# Patient Record
Sex: Female | Born: 1965 | Race: White | Hispanic: No | Marital: Married | Smoking: Never smoker
Health system: Southern US, Community
[De-identification: ages and names within clinical notes are randomized; demographics above are authoritative.]

## PROBLEM LIST (undated history)

## (undated) DIAGNOSIS — M1990 Unspecified osteoarthritis, unspecified site: Secondary | ICD-10-CM

## (undated) DIAGNOSIS — E079 Disorder of thyroid, unspecified: Secondary | ICD-10-CM

## (undated) DIAGNOSIS — E161 Other hypoglycemia: Secondary | ICD-10-CM

## (undated) DIAGNOSIS — E119 Type 2 diabetes mellitus without complications: Secondary | ICD-10-CM

## (undated) DIAGNOSIS — F419 Anxiety disorder, unspecified: Secondary | ICD-10-CM

## (undated) DIAGNOSIS — M549 Dorsalgia, unspecified: Secondary | ICD-10-CM

## (undated) DIAGNOSIS — E039 Hypothyroidism, unspecified: Secondary | ICD-10-CM

## (undated) DIAGNOSIS — K219 Gastro-esophageal reflux disease without esophagitis: Secondary | ICD-10-CM

## (undated) DIAGNOSIS — F329 Major depressive disorder, single episode, unspecified: Secondary | ICD-10-CM

## (undated) DIAGNOSIS — J45909 Unspecified asthma, uncomplicated: Secondary | ICD-10-CM

## (undated) DIAGNOSIS — F32A Depression, unspecified: Secondary | ICD-10-CM

## (undated) DIAGNOSIS — A0472 Enterocolitis due to Clostridium difficile, not specified as recurrent: Secondary | ICD-10-CM

## (undated) DIAGNOSIS — J069 Acute upper respiratory infection, unspecified: Secondary | ICD-10-CM

## (undated) DIAGNOSIS — I10 Essential (primary) hypertension: Secondary | ICD-10-CM

## (undated) DIAGNOSIS — G709 Myoneural disorder, unspecified: Secondary | ICD-10-CM

## (undated) HISTORY — DX: Acute upper respiratory infection, unspecified: J06.9

## (undated) HISTORY — PX: CERVICAL ABLATION: SHX5771

## (undated) HISTORY — PX: ILEOSTOMY REVISION: SHX1785

## (undated) HISTORY — PX: TONSILLECTOMY: SUR1361

## (undated) HISTORY — PX: HERNIA REPAIR: SHX51

## (undated) HISTORY — PX: ADENOIDECTOMY: SUR15

## (undated) HISTORY — PX: BLADDER SURGERY: SHX569

## (undated) HISTORY — PX: ABDOMINAL SURGERY: SHX537

## (undated) HISTORY — PX: APPENDECTOMY: SHX54

## (undated) HISTORY — PX: BOWEL RESECTION: SHX1257

## (undated) HISTORY — PX: GASTRIC BYPASS: SHX52

## (undated) HISTORY — PX: CARDIAC CATHETERIZATION: SHX172

## (undated) HISTORY — PX: CHOLECYSTECTOMY: SHX55

---

## 2012-02-08 HISTORY — PX: TOTAL KNEE ARTHROPLASTY: SHX125

## 2013-08-23 ENCOUNTER — Encounter (HOSPITAL_BASED_OUTPATIENT_CLINIC_OR_DEPARTMENT_OTHER): Payer: Self-pay | Admitting: Emergency Medicine

## 2013-08-23 ENCOUNTER — Emergency Department (HOSPITAL_BASED_OUTPATIENT_CLINIC_OR_DEPARTMENT_OTHER)
Admission: EM | Admit: 2013-08-23 | Discharge: 2013-08-23 | Disposition: A | Discharge status: Home / Self Care | Payer: Medicare Other | Attending: Emergency Medicine | Admitting: Emergency Medicine

## 2013-08-23 DIAGNOSIS — Z79899 Other long term (current) drug therapy: Secondary | ICD-10-CM | POA: Insufficient documentation

## 2013-08-23 DIAGNOSIS — Y9289 Other specified places as the place of occurrence of the external cause: Secondary | ICD-10-CM | POA: Diagnosis not present

## 2013-08-23 DIAGNOSIS — I10 Essential (primary) hypertension: Secondary | ICD-10-CM | POA: Insufficient documentation

## 2013-08-23 DIAGNOSIS — IMO0002 Reserved for concepts with insufficient information to code with codable children: Secondary | ICD-10-CM | POA: Insufficient documentation

## 2013-08-23 DIAGNOSIS — E119 Type 2 diabetes mellitus without complications: Secondary | ICD-10-CM | POA: Diagnosis not present

## 2013-08-23 DIAGNOSIS — Y9389 Activity, other specified: Secondary | ICD-10-CM | POA: Insufficient documentation

## 2013-08-23 DIAGNOSIS — S01309A Unspecified open wound of unspecified ear, initial encounter: Secondary | ICD-10-CM | POA: Diagnosis present

## 2013-08-23 DIAGNOSIS — E079 Disorder of thyroid, unspecified: Secondary | ICD-10-CM | POA: Diagnosis not present

## 2013-08-23 DIAGNOSIS — Z23 Encounter for immunization: Secondary | ICD-10-CM | POA: Diagnosis not present

## 2013-08-23 HISTORY — DX: Type 2 diabetes mellitus without complications: E11.9

## 2013-08-23 HISTORY — DX: Essential (primary) hypertension: I10

## 2013-08-23 HISTORY — DX: Disorder of thyroid, unspecified: E07.9

## 2013-08-23 MED ORDER — TETANUS-DIPHTH-ACELL PERTUSSIS 5-2.5-18.5 LF-MCG/0.5 IM SUSP
0.5000 mL | Freq: Once | INTRAMUSCULAR | Status: AC
  Administered 2013-08-23: 0.5 mL via INTRAMUSCULAR
  Filled 2013-08-23: qty 0.5

## 2013-08-23 NOTE — Discharge Instructions (Signed)
Sutures should be removed in 7 days.  Laceration Care, Adult A laceration is a cut or lesion that goes through all layers of the skin and into the tissue just beneath the skin. TREATMENT  Some lacerations may not require closure. Some lacerations may not be able to be closed due to an increased risk of infection. It is important to see your caregiver as soon as possible after an injury to minimize the risk of infection and maximize the opportunity for successful closure. If closure is appropriate, pain medicines may be given, if needed. The wound will be cleaned to help prevent infection. Your caregiver will use stitches (sutures), staples, wound glue (adhesive), or skin adhesive strips to repair the laceration. These tools bring the skin edges together to allow for faster healing and a better cosmetic outcome. However, all wounds will heal with a scar. Once the wound has healed, scarring can be minimized by covering the wound with sunscreen during the day for 1 full year. HOME CARE INSTRUCTIONS  For sutures or staples:  Keep the wound clean and dry.  If you were given a bandage (dressing), you should change it at least once a day. Also, change the dressing if it becomes wet or dirty, or as directed by your caregiver.  Wash the wound with soap and water 2 times a day. Rinse the wound off with water to remove all soap. Pat the wound dry with a clean towel.  After cleaning, apply a thin layer of the antibiotic ointment as recommended by your caregiver. This will help prevent infection and keep the dressing from sticking.  You may shower as usual after the first 24 hours. Do not soak the wound in water until the sutures are removed.  Only take over-the-counter or prescription medicines for pain, discomfort, or fever as directed by your caregiver.  Get your sutures or staples removed as directed by your caregiver. For skin adhesive strips:  Keep the wound clean and dry.  Do not get the skin  adhesive strips wet. You may bathe carefully, using caution to keep the wound dry.  If the wound gets wet, pat it dry with a clean towel.  Skin adhesive strips will fall off on their own. You may trim the strips as the wound heals. Do not remove skin adhesive strips that are still stuck to the wound. They will fall off in time. For wound adhesive:  You may briefly wet your wound in the shower or bath. Do not soak or scrub the wound. Do not swim. Avoid periods of heavy perspiration until the skin adhesive has fallen off on its own. After showering or bathing, gently pat the wound dry with a clean towel.  Do not apply liquid medicine, cream medicine, or ointment medicine to your wound while the skin adhesive is in place. This may loosen the film before your wound is healed.  If a dressing is placed over the wound, be careful not to apply tape directly over the skin adhesive. This may cause the adhesive to be pulled off before the wound is healed.  Avoid prolonged exposure to sunlight or tanning lamps while the skin adhesive is in place. Exposure to ultraviolet light in the first year will darken the scar.  The skin adhesive will usually remain in place for 5 to 10 days, then naturally fall off the skin. Do not pick at the adhesive film. You may need a tetanus shot if:  You cannot remember when you had your last tetanus  shot.  You have never had a tetanus shot. If you get a tetanus shot, your arm may swell, get red, and feel warm to the touch. This is common and not a problem. If you need a tetanus shot and you choose not to have one, there is a rare chance of getting tetanus. Sickness from tetanus can be serious. SEEK MEDICAL CARE IF:   You have redness, swelling, or increasing pain in the wound.  You see a red line that goes away from the wound.  You have yellowish-white fluid (pus) coming from the wound.  You have a fever.  You notice a bad smell coming from the wound or  dressing.  Your wound breaks open before or after sutures have been removed.  You notice something coming out of the wound such as wood or glass.  Your wound is on your hand or foot and you cannot move a finger or toe. SEEK IMMEDIATE MEDICAL CARE IF:   Your pain is not controlled with prescribed medicine.  You have severe swelling around the wound causing pain and numbness or a change in color in your arm, hand, leg, or foot.  Your wound splits open and starts bleeding.  You have worsening numbness, weakness, or loss of function of any joint around or beyond the wound.  You develop painful lumps near the wound or on the skin anywhere on your body. MAKE SURE YOU:   Understand these instructions.  Will watch your condition.  Will get help right away if you are not doing well or get worse. Document Released: 01/24/2005 Document Revised: 04/18/2011 Document Reviewed: 07/20/2010 [facility] Patient Information 2015 [place], [place]. This information is not intended to replace advice given to you by your health care provider. Make sure you discuss any questions you have with your health care provider.

## 2013-08-23 NOTE — ED Notes (Signed)
Pt. Has laceration behind the R ear due to a car door hitting her behind the R ear.

## 2013-08-23 NOTE — ED Provider Notes (Addendum)
CSN: [id number]     Arrival date & time 08/23/13  1659 History   First MD Initiated Contact with Patient 08/23/13 1707     Chief Complaint  Patient presents with  . Laceration     (Consider location/radiation/quality/duration/timing/severity/associated sxs/prior Treatment) HPI Comments: The patient presents to the ER for evaluation of a laceration behind her right ear. Patient accidentally hit her ear with a car door. No significant headache, no cause of consciousness. Patient reports only mild pain at the site.   Past Medical History  Diagnosis Date  . Diabetes mellitus without complication   . Thyroid disease   . Hypertension    Past Surgical History  Procedure Laterality Date  . Tonsillectomy    . Cholecystectomy    . Gastric bypass    . Cervical ablation    . Bladder surgery    . Total knee arthroplasty     No family history on file. History  Substance Use Topics  . Smoking status: Not on file  . Smokeless tobacco: Not on file  . Alcohol Use: Not on file   OB History   Grav Para Term Preterm Abortions TAB SAB Ect Mult Living                 Review of Systems  Skin: Positive for wound.      Allergies  Adhesive and Oxycodone  Home Medications   Prior to Admission medications   Medication Sig Start Date End Date Taking? Authorizing Provider  desvenlafaxine (PRISTIQ) 50 MG 24 hr tablet Take 50 mg by mouth daily.   Yes Historical Provider, MD  DULoxetine (CYMBALTA) 60 MG capsule Take 60 mg by mouth daily.   Yes Historical Provider, MD  levothyroxine (SYNTHROID, LEVOTHROID) 50 MCG tablet Take 50 mcg by mouth daily before breakfast.   Yes Historical Provider, MD  lisinopril (PRINIVIL,ZESTRIL) 40 MG tablet Take 40 mg by mouth daily.   Yes Historical Provider, MD  metformin (FORTAMET) 500 MG (OSM) 24 hr tablet Take 500 mg by mouth daily with breakfast.   Yes Historical Provider, MD  traZODone (DESYREL) 50 MG tablet Take 50 mg by mouth at bedtime.   Yes Historical  Provider, MD   BP 133/89  Pulse 67  Temp(Src) 98 F (36.7 C) (Oral)  Resp 18  Ht 5\' 2"  (1.575 m)  Wt 170 lb (77.111 kg)  BMI 31.09 kg/m2  SpO2 100% Physical Exam  Constitutional: She is oriented to person, place, and time.  HENT:  Head:    Neck: Normal range of motion. Neck supple. Muscular tenderness present. No spinous process tenderness present.  Musculoskeletal: Normal range of motion.  Neurological: She is alert and oriented to person, place, and time. She has normal strength. No cranial nerve deficit or sensory deficit. GCS eye subscore is 4. GCS verbal subscore is 5. GCS motor subscore is 6.  Skin:       ED Course  Procedures (including critical care time)  LACERATION REPAIR #1 Performed by: Gilda CreasePOLLINA, Tobey Schmelzle J. Authorized by: Gilda CreasePOLLINA, Matvey Llanas J. Consent: Verbal consent obtained. Risks and benefits: risks, benefits and alternatives were discussed Consent given by: patient Patient identity confirmed: provided demographic data Prepped and Draped in normal sterile fashion Wound explored  Laceration Location: right ear Laceration Length: 3cm No Foreign Bodies seen or palpated Anesthesia: local infiltration Local anesthetic: lidocaine 2% Anesthetic total: 2 ml Irrigation method: syringe Amount of cleaning: standard Skin closure: sutures Number of sutures: 5 Technique: simple interrupted, 5-0 prolene Patient tolerance: Patient  tolerated the procedure well with no immediate complications.  LACERATION REPAIR Performed by: Gilda Crease. Authorized by: Gilda Crease Consent: Verbal consent obtained. Risks and benefits: risks, benefits and alternatives were discussed Consent given by: patient Patient identity confirmed: provided demographic data Prepped and Draped in normal sterile fashion Wound explored  Laceration Location: right ear Laceration Length: 3cm No Foreign Bodies seen or palpated Anesthesia: local infiltration Local  anesthetic: none Anesthetic total: Irrigation method: syringe Amount of cleaning: standard Skin closure: Dermabond Patient tolerance: Patient tolerated the procedure well with no immediate complications.   Labs Review Labs Reviewed - No data to display  Imaging Review No results found.   EKG Interpretation None      MDM   Final diagnoses:  None  Laceration  Patient presents with 2 lacerations behind the right ear. The 3 cm laceration was deep, required extensive irrigation and sutures. Sutures will be removed in 7 days. The more superficial laceration at the reflection of the year did not require sutures, was simply closed with Dermabond.  A pressure dressing was then placed over the ear. I did discuss the need for continued pressure for one or 2 days to prevent hematoma formation and cauliflower ear.  Patient did not have any evidence of direct head injury and did not require any neuroimaging. Neurologic exam was unremarkable.    Gilda Crease, MD 08/23/13 1740  Gilda Crease, MD 08/23/13 [phone]

## 2013-09-20 ENCOUNTER — Encounter (HOSPITAL_BASED_OUTPATIENT_CLINIC_OR_DEPARTMENT_OTHER): Payer: Self-pay | Admitting: Emergency Medicine

## 2013-09-20 ENCOUNTER — Emergency Department (HOSPITAL_BASED_OUTPATIENT_CLINIC_OR_DEPARTMENT_OTHER)
Admission: EM | Admit: 2013-09-20 | Discharge: 2013-09-20 | Disposition: A | Discharge status: Home / Self Care | Payer: Medicare Other | Attending: Emergency Medicine | Admitting: Emergency Medicine

## 2013-09-20 DIAGNOSIS — E119 Type 2 diabetes mellitus without complications: Secondary | ICD-10-CM | POA: Diagnosis not present

## 2013-09-20 DIAGNOSIS — Y929 Unspecified place or not applicable: Secondary | ICD-10-CM | POA: Diagnosis not present

## 2013-09-20 DIAGNOSIS — I10 Essential (primary) hypertension: Secondary | ICD-10-CM | POA: Insufficient documentation

## 2013-09-20 DIAGNOSIS — E079 Disorder of thyroid, unspecified: Secondary | ICD-10-CM | POA: Diagnosis not present

## 2013-09-20 DIAGNOSIS — Y939 Activity, unspecified: Secondary | ICD-10-CM | POA: Diagnosis not present

## 2013-09-20 DIAGNOSIS — T63461A Toxic effect of venom of wasps, accidental (unintentional), initial encounter: Secondary | ICD-10-CM | POA: Insufficient documentation

## 2013-09-20 DIAGNOSIS — W57XXXA Bitten or stung by nonvenomous insect and other nonvenomous arthropods, initial encounter: Secondary | ICD-10-CM | POA: Insufficient documentation

## 2013-09-20 DIAGNOSIS — S60569A Insect bite (nonvenomous) of unspecified hand, initial encounter: Secondary | ICD-10-CM | POA: Insufficient documentation

## 2013-09-20 DIAGNOSIS — Z79899 Other long term (current) drug therapy: Secondary | ICD-10-CM | POA: Insufficient documentation

## 2013-09-20 DIAGNOSIS — T63441A Toxic effect of venom of bees, accidental (unintentional), initial encounter: Secondary | ICD-10-CM

## 2013-09-20 DIAGNOSIS — T6391XA Toxic effect of contact with unspecified venomous animal, accidental (unintentional), initial encounter: Secondary | ICD-10-CM | POA: Insufficient documentation

## 2013-09-20 MED ORDER — PREDNISONE 50 MG PO TABS
60.0000 mg | ORAL_TABLET | Freq: Once | ORAL | Status: AC
  Administered 2013-09-20: 60 mg via ORAL
  Filled 2013-09-20 (×2): qty 1

## 2013-09-20 MED ORDER — PREDNISONE 10 MG PO TABS: 20.0000 mg | ORAL_TABLET | Freq: Every day | ORAL | Status: AC

## 2013-09-20 MED ORDER — DIPHENHYDRAMINE HCL 25 MG PO CAPS
25.0000 mg | ORAL_CAPSULE | Freq: Once | ORAL | Status: AC
  Administered 2013-09-20: 25 mg via ORAL
  Filled 2013-09-20: qty 1

## 2013-09-20 NOTE — ED Notes (Signed)
States she was stung by yellow jackets at 2 pm today.  Stung above left eye, mid back, and left hand. Left hand slightly swollen. C/o pain only in left hand.

## 2013-09-20 NOTE — ED Provider Notes (Signed)
CSN: [id number]     Arrival date & time 09/20/13  1835 History  This chart was scribed for Hilario Quarryanielle S Arina Torry, MD by Julian HyMorgan Graham, ED Scribe. The patient was seen in [facility]. The patient's care was started at 7:05 PM.    Chief Complaint  Patient presents with  . Insect Bite   Patient is a 48 y.o. female presenting with animal bite. The history is provided by the patient. No language interpreter was used.  Animal Bite Contact animal:  Insect Location:  Face, torso and hand Facial injury location:  L eye Hand injury location:  L hand Torso injury location:  R flank Time since incident:  5 hours Pain details:    Severity:  Moderate Incident location:  Home Ineffective treatments:  Cold compresses and prescription drugs Associated symptoms: rash   Associated symptoms: no fever and no numbness    HPI Comments: Cynthia Modenanita Lamour is a 48 y.o. female who presents to the Emergency Department complaining of insect bite on her left hand, right flank and above left eyebrow onset 5 hours ago. Pt reports associated redness and pain near her wound sites. Pt reports the worst pain is near her left thumb and the pain radiates to her left wrist. Pt reports she is having difficulty opening her left hand. Pt reports she was attacked by a wasps nest and stung several times. Pt reports she saw a raised, white bump on left hand that has been resolved with cold compress. Pt reports taking benadryl and using cold compresses with minimal relief. Pt reports taking a Vicodin with minimal relief. Pt denies syncope, vomiting, swelling.   Pt denies having a previous allergic reaction to bee stings. Pt reports past medical history of diabetes and HTN. Pt reports taking metformin ER. Pt reports she had a gastric bypass surgery and it causes her to have uncontrolled swings in blood sugar. Pt reports she can check her blood sugar at home.  Past Medical History  Diagnosis Date  . Diabetes mellitus without complication   .  Thyroid disease   . Hypertension    Past Surgical History  Procedure Laterality Date  . Tonsillectomy    . Cholecystectomy    . Gastric bypass    . Cervical ablation    . Bladder surgery    . Total knee arthroplasty     No family history on file. History  Substance Use Topics  . Smoking status: Never Smoker   . Smokeless tobacco: Not on file  . Alcohol Use: Not on file   OB History   Grav Para Term Preterm Abortions TAB SAB Ect Mult Living                 Review of Systems  Constitutional: Negative for fever and chills.  Respiratory: Negative for shortness of breath and wheezing.   Cardiovascular: Negative for leg swelling.  Gastrointestinal: Negative for nausea and vomiting.  Musculoskeletal: Negative for joint swelling.  Skin: Positive for rash.  Neurological: Negative for numbness.  All other systems reviewed and are negative.  Allergies  Adhesive and Oxycodone  Home Medications   Prior to Admission medications   Medication Sig Start Date End Date Taking? Authorizing Provider  desvenlafaxine (PRISTIQ) 50 MG 24 hr tablet Take 50 mg by mouth daily.   Yes Historical Provider, MD  DULoxetine (CYMBALTA) 60 MG capsule Take 60 mg by mouth daily.   Yes Historical Provider, MD  levothyroxine (SYNTHROID, LEVOTHROID) 50 MCG tablet Take 50 mcg by mouth daily  before breakfast.   Yes Historical Provider, MD  lisinopril (PRINIVIL,ZESTRIL) 40 MG tablet Take 40 mg by mouth daily.   Yes Historical Provider, MD  metformin (FORTAMET) 500 MG (OSM) 24 hr tablet Take 500 mg by mouth daily with breakfast.   Yes Historical Provider, MD  montelukast (SINGULAIR) 10 MG tablet Take 10 mg by mouth at bedtime.   Yes Historical Provider, MD  omeprazole (PRILOSEC) 20 MG capsule Take 20 mg by mouth daily.   Yes Historical Provider, MD  traZODone (DESYREL) 50 MG tablet Take 50 mg by mouth at bedtime.   Yes Historical Provider, MD   Triage Vitals: BP 141/87  Pulse 67  Temp(Src) 98.2 F (36.8 C)   Resp 18  Wt 165 lb (74.844 kg)  SpO2 100% Physical Exam  Nursing note and vitals reviewed. Constitutional: She is oriented to person, place, and time. She appears well-developed and well-nourished.  HENT:  Head: Normocephalic and atraumatic.  Right Ear: External ear normal.  Left Ear: External ear normal.  Nose: Nose normal.  Mouth/Throat: Oropharynx is clear and moist.  Eyes: Conjunctivae and EOM are normal. Pupils are equal, round, and reactive to light.  Neck: Normal range of motion. Neck supple. No JVD present. No tracheal deviation present. No thyromegaly present.  Cardiovascular: Normal rate, regular rhythm, normal heart sounds and intact distal pulses.   Pulmonary/Chest: Effort normal and breath sounds normal. No respiratory distress. She has no wheezes.  Abdominal: Soft. Bowel sounds are normal. She exhibits no mass. There is no tenderness. There is no guarding.  Musculoskeletal: Normal range of motion.  Lymphadenopathy:    She has no cervical adenopathy.  Neurological: She is alert and oriented to person, place, and time. She has normal reflexes. No cranial nerve deficit or sensory deficit. Gait normal. GCS eye subscore is 4. GCS verbal subscore is 5. GCS motor subscore is 6.  Reflex Scores:      Bicep reflexes are 2+ on the right side and 2+ on the left side.      Patellar reflexes are 2+ on the right side and 2+ on the left side. Strength is 5/5 bilateral elbow flexor/extensors, wrist extension/flexion, intrinsic hand strength equal Bilateral hip flexion/extension 5/5, knee flexion/extension 5/5, ankle 5/5 flexion extension    Skin: Skin is warm and dry.  Erythematous area 2x2 cm right mid back, left eyebrow and left palmar thenar eminence some erythema and swelling,   Psychiatric: She has a normal mood and affect. Her behavior is normal. Judgment and thought content normal.    ED Course  Procedures (including critical care time) DIAGNOSTIC STUDIES: Oxygen Saturation is  100% on RA, normal by my interpretation.    COORDINATION OF CARE: 7:10 PM- Will order 10 mg Prednisone and 25 mg Benadryl. Pt advised to closely monitor her blood sugars at home. Patient informed of current plan for treatment and evaluation and agrees with plan at this time.  MDM   Final diagnoses:  Bee sting, accidental or unintentional, initial encounter     I personally performed the services described in this documentation, which was scribed in my presence. The recorded information has been reviewed and considered.     Hilario Quarry, MD 09/21/13 [phone]

## 2013-09-20 NOTE — Discharge Instructions (Signed)

## 2013-09-20 NOTE — ED Notes (Signed)
MD at bedside. 

## 2013-10-30 ENCOUNTER — Other Ambulatory Visit: Payer: Self-pay | Admitting: Orthopedic Surgery

## 2013-11-26 ENCOUNTER — Encounter (HOSPITAL_COMMUNITY): Payer: Self-pay | Admitting: Pharmacy Technician

## 2013-11-28 NOTE — Pre-Procedure Instructions (Signed)
Cynthia Cohen  11/28/2013   Your procedure is scheduled on: Monday, Nov. 2nd   [facility] at  8:00 AM.   Call this number if you have problems the morning of surgery: [phone]   Remember:   Do not eat food or drink liquids after midnight Sunday.    Take these medicines the morning of surgery with A SIP OF WATER: SOMA, CLONAZEPAM, CYMBALTA, GABAPENTIN, VICODIN, Thyroid medicine, PRILOSEC    Do not wear jewelry, make-up or nail polish.   Do not wear lotions, powders, or perfumes. You may NOT wear deodorant the day of surgery.   Do not shave 48 hours prior to surgery.    Do not bring valuables to the hospital.  [facility] is not responsible for any belongings or valuables.               Contacts, dentures or bridgework may not be worn into surgery.  Leave suitcase in the car. After surgery it may be brought to your room.  For patients admitted to the hospital, discharge time is determined by your treatment team.    Name and phone number of your driver: /w Fran   Special Instructions: Special Instructions: [facility] - Preparing for Surgery  Before surgery, you can play an important role.  Because skin is not sterile, your skin needs to be as free of germs as possible.  You can reduce the number of germs on you skin by washing with CHG (chlorahexidine gluconate) soap before surgery.  CHG is an antiseptic cleaner which kills germs and bonds with the skin to continue killing germs even after washing.  Please DO NOT use if you have an allergy to CHG or antibacterial soaps.  If your skin becomes reddened/irritated stop using the CHG and inform your nurse when you arrive at Short Stay.  Do not shave (including legs and underarms) for at least 48 hours prior to the first CHG shower.  You may shave your face.  Please follow these instructions carefully:   1.  Shower with CHG Soap the night before surgery and the  morning of Surgery.  2.  If you  choose to wash your hair, wash your hair first as usual with your  normal shampoo.  3.  After you shampoo, rinse your hair and body thoroughly to remove the  Shampoo.  4.  Use CHG as you would any other liquid soap.  You can apply chg directly to the skin and wash gently with scrungie or a clean washcloth.  5.  Apply the CHG Soap to your body ONLY FROM THE NECK DOWN.    Do not use on open wounds or open sores.  Avoid contact with your eyes, ears, mouth and genitals (private parts).  Wash genitals (private parts)   with your normal soap.  6.  Wash thoroughly, paying special attention to the area where your surgery will be performed.  7.  Thoroughly rinse your body with warm water from the neck down.  8.  DO NOT shower/wash with your normal soap after using and rinsing off   the CHG Soap.  9.  Pat yourself dry with a clean towel.            10.  Wear clean pajamas.            11 .  Place clean sheets on your bed the night of your first shower and do not sleep with pets.  Day of Surgery  Do not apply any lotions/deodorants the morning of surgery.  Please wear clean clothes to the hospital/surgery center.   Please read over the following fact sheets that you were given: Pain Booklet, Coughing and Deep Breathing, MRSA Information and Surgical Site Infection Prevention

## 2013-11-29 ENCOUNTER — Encounter (HOSPITAL_COMMUNITY)
Admission: RE | Admit: 2013-11-29 | Discharge: 2013-11-29 | Disposition: A | Discharge status: Home / Self Care | Payer: Medicare Other | Source: Ambulatory Visit | Attending: Orthopedic Surgery | Admitting: Orthopedic Surgery

## 2013-11-29 ENCOUNTER — Encounter (HOSPITAL_COMMUNITY): Payer: Self-pay

## 2013-11-29 DIAGNOSIS — B951 Streptococcus, group B, as the cause of diseases classified elsewhere: Secondary | ICD-10-CM | POA: Diagnosis not present

## 2013-11-29 DIAGNOSIS — Z01812 Encounter for preprocedural laboratory examination: Secondary | ICD-10-CM | POA: Insufficient documentation

## 2013-11-29 HISTORY — DX: Anxiety disorder, unspecified: F41.9

## 2013-11-29 HISTORY — DX: Other hypoglycemia: E16.1

## 2013-11-29 HISTORY — DX: Depression, unspecified: F32.A

## 2013-11-29 HISTORY — DX: Major depressive disorder, single episode, unspecified: F32.9

## 2013-11-29 HISTORY — DX: Myoneural disorder, unspecified: G70.9

## 2013-11-29 HISTORY — DX: Gastro-esophageal reflux disease without esophagitis: K21.9

## 2013-11-29 HISTORY — DX: Unspecified osteoarthritis, unspecified site: M19.90

## 2013-11-29 HISTORY — DX: Unspecified asthma, uncomplicated: J45.909

## 2013-11-29 HISTORY — DX: Hypothyroidism, unspecified: E03.9

## 2013-11-29 LAB — PROTIME-INR
INR: 0.97 (ref 0.00–1.49)
PROTHROMBIN TIME: 13 s (ref 11.6–15.2)

## 2013-11-29 LAB — COMPREHENSIVE METABOLIC PANEL
ALT: 43 U/L — ABNORMAL HIGH (ref 0–35)
AST: 32 U/L (ref 0–37)
Albumin: 4.1 g/dL (ref 3.5–5.2)
Alkaline Phosphatase: 101 U/L (ref 39–117)
Anion gap: 12 (ref 5–15)
BUN: 10 mg/dL (ref 6–23)
CO2: 29 mEq/L (ref 19–32)
Calcium: 9.5 mg/dL (ref 8.4–10.5)
Chloride: 101 mEq/L (ref 96–112)
Creatinine, Ser: 0.67 mg/dL (ref 0.50–1.10)
GFR calc Af Amer: >90 mL/min (ref >90)
GFR calc non Af Amer: >90 mL/min (ref >90)
Glucose, Bld: 87 mg/dL (ref 70–99)
Potassium: 4.2 mEq/L (ref 3.7–5.3)
Sodium: 142 mEq/L (ref 137–147)
TOTAL PROTEIN: 7.3 g/dL (ref 6.0–8.3)

## 2013-11-29 LAB — CBC WITH DIFFERENTIAL/PLATELET
Basophils Absolute: 0 10*3/uL (ref 0.0–0.1)
Basophils Relative: 0 % (ref 0–1)
EOS ABS: 0.4 10*3/uL (ref 0.0–0.7)
Eosinophils Relative: 4 % (ref 0–5)
HEMATOCRIT: 44.1 % (ref 36.0–46.0)
Hemoglobin: 14.5 g/dL (ref 12.0–15.0)
Lymphocytes Relative: 25 % (ref 12–46)
Lymphs Abs: 2.2 10*3/uL (ref 0.7–4.0)
MCH: 28.9 pg (ref 26.0–34.0)
MCHC: 32.9 g/dL (ref 30.0–36.0)
MCV: 87.8 fL (ref 78.0–100.0)
Monocytes Absolute: 0.7 10*3/uL (ref 0.1–1.0)
Monocytes Relative: 8 % (ref 3–12)
Neutro Abs: 5.7 10*3/uL (ref 1.7–7.7)
Neutrophils Relative %: 63 % (ref 43–77)
Platelets: 318 10*3/uL (ref 150–400)
RBC: 5.02 MIL/uL (ref 3.87–5.11)
RDW: 12.7 % (ref 11.5–15.5)
WBC: 9 10*3/uL (ref 4.0–10.5)

## 2013-11-29 LAB — SURGICAL PCR SCREEN
MRSA, PCR: NEGATIVE
Staphylococcus aureus: POSITIVE — AB

## 2013-11-29 LAB — URINALYSIS, ROUTINE W REFLEX MICROSCOPIC
Bilirubin Urine: NEGATIVE
Glucose, UA: NEGATIVE mg/dL
HGB URINE DIPSTICK: NEGATIVE
Ketones, ur: NEGATIVE mg/dL
Leukocytes, UA: NEGATIVE
NITRITE: NEGATIVE
PH: 5 (ref 5.0–8.0)
Protein, ur: NEGATIVE mg/dL
SPECIFIC GRAVITY, URINE: 1.007 (ref 1.005–1.030)
Urobilinogen, UA: 0.2 mg/dL (ref 0.0–1.0)

## 2013-11-29 LAB — APTT: aPTT: 33 seconds (ref 24–37)

## 2013-12-01 LAB — URINE CULTURE

## 2013-12-07 ENCOUNTER — Encounter (HOSPITAL_BASED_OUTPATIENT_CLINIC_OR_DEPARTMENT_OTHER): Payer: Self-pay | Admitting: Emergency Medicine

## 2013-12-07 ENCOUNTER — Emergency Department (HOSPITAL_BASED_OUTPATIENT_CLINIC_OR_DEPARTMENT_OTHER): Payer: Medicare Other

## 2013-12-07 ENCOUNTER — Emergency Department (HOSPITAL_BASED_OUTPATIENT_CLINIC_OR_DEPARTMENT_OTHER)
Admission: EM | Admit: 2013-12-07 | Discharge: 2013-12-07 | Disposition: A | Discharge status: Home / Self Care | Payer: Medicare Other | Source: Home / Self Care | Attending: Emergency Medicine | Admitting: Emergency Medicine

## 2013-12-07 DIAGNOSIS — M24662 Ankylosis, left knee: Secondary | ICD-10-CM | POA: Diagnosis not present

## 2013-12-07 DIAGNOSIS — R1310 Dysphagia, unspecified: Secondary | ICD-10-CM

## 2013-12-07 DIAGNOSIS — T7840XA Allergy, unspecified, initial encounter: Secondary | ICD-10-CM

## 2013-12-07 DIAGNOSIS — N390 Urinary tract infection, site not specified: Secondary | ICD-10-CM

## 2013-12-07 LAB — BASIC METABOLIC PANEL
ANION GAP: 17 — AB (ref 5–15)
Anion gap: 12 (ref 5–15)
BUN: 9 mg/dL (ref 6–23)
BUN: 14 mg/dL (ref 6–23)
CALCIUM: 9.5 mg/dL (ref 8.4–10.5)
CHLORIDE: 108 meq/L (ref 96–112)
CO2: 28 mEq/L (ref 19–32)
CO2: 21 meq/L (ref 19–32)
Calcium: 9.5 mg/dL (ref 8.4–10.5)
Chloride: 105 mEq/L (ref 96–112)
Creatinine, Ser: 0.6 mg/dL (ref 0.50–1.10)
Creatinine, Ser: 0.5 mg/dL (ref 0.50–1.10)
GFR calc Af Amer: >90 mL/min (ref >90)
GFR calc Af Amer: >90 mL/min (ref >90)
GFR calc non Af Amer: >90 mL/min (ref >90)
Glucose, Bld: 88 mg/dL (ref 70–99)
Glucose, Bld: 138 mg/dL — ABNORMAL HIGH (ref 70–99)
Potassium: 4 mEq/L (ref 3.7–5.3)
Potassium: 3.9 mEq/L (ref 3.7–5.3)
SODIUM: 146 meq/L (ref 137–147)
Sodium: 145 mEq/L (ref 137–147)

## 2013-12-07 LAB — CBC WITH DIFFERENTIAL/PLATELET
BASOS ABS: 0 10*3/uL (ref 0.0–0.1)
BASOS PCT: 0 % (ref 0–1)
Basophils Absolute: 0 10*3/uL (ref 0.0–0.1)
Basophils Relative: 0 % (ref 0–1)
EOS ABS: 0.4 10*3/uL (ref 0.0–0.7)
EOS ABS: 0 10*3/uL (ref 0.0–0.7)
EOS PCT: 5 % (ref 0–5)
Eosinophils Relative: 0 % (ref 0–5)
HCT: 43.9 % (ref 36.0–46.0)
HEMATOCRIT: 42 % (ref 36.0–46.0)
Hemoglobin: 13.8 g/dL (ref 12.0–15.0)
Hemoglobin: 14.7 g/dL (ref 12.0–15.0)
LYMPHS PCT: 8 % — AB (ref 12–46)
Lymphocytes Relative: 18 % (ref 12–46)
Lymphs Abs: 1.7 10*3/uL (ref 0.7–4.0)
Lymphs Abs: 0.8 10*3/uL (ref 0.7–4.0)
MCH: 29.3 pg (ref 26.0–34.0)
MCH: 29.6 pg (ref 26.0–34.0)
MCHC: 32.9 g/dL (ref 30.0–36.0)
MCHC: 33.5 g/dL (ref 30.0–36.0)
MCV: 89.2 fL (ref 78.0–100.0)
MCV: 88.5 fL (ref 78.0–100.0)
MONO ABS: 0.7 10*3/uL (ref 0.1–1.0)
MONOS PCT: 8 % (ref 3–12)
Monocytes Absolute: 0.1 10*3/uL (ref 0.1–1.0)
Monocytes Relative: 1 % — ABNORMAL LOW (ref 3–12)
Neutro Abs: 6.7 10*3/uL (ref 1.7–7.7)
Neutro Abs: 9.1 10*3/uL — ABNORMAL HIGH (ref 1.7–7.7)
Neutrophils Relative %: 69 % (ref 43–77)
Neutrophils Relative %: 91 % — ABNORMAL HIGH (ref 43–77)
Platelets: 318 10*3/uL (ref 150–400)
Platelets: 302 10*3/uL (ref 150–400)
RBC: 4.71 MIL/uL (ref 3.87–5.11)
RBC: 4.96 MIL/uL (ref 3.87–5.11)
RDW: 12.9 % (ref 11.5–15.5)
RDW: 12.9 % (ref 11.5–15.5)
WBC: 9.6 10*3/uL (ref 4.0–10.5)
WBC: 9.9 10*3/uL (ref 4.0–10.5)

## 2013-12-07 LAB — URINALYSIS, ROUTINE W REFLEX MICROSCOPIC
BILIRUBIN URINE: NEGATIVE
Glucose, UA: NEGATIVE mg/dL
Hgb urine dipstick: NEGATIVE
Ketones, ur: NEGATIVE mg/dL
Nitrite: NEGATIVE
PROTEIN: NEGATIVE mg/dL
SPECIFIC GRAVITY, URINE: 1.018 (ref 1.005–1.030)
Urobilinogen, UA: 1 mg/dL (ref 0.0–1.0)
pH: 5.5 (ref 5.0–8.0)

## 2013-12-07 LAB — URINE MICROSCOPIC-ADD ON

## 2013-12-07 LAB — CBG MONITORING, ED: Glucose-Capillary: 130 mg/dL — ABNORMAL HIGH (ref 70–99)

## 2013-12-07 MED ORDER — METHYLPREDNISOLONE SODIUM SUCC 125 MG IJ SOLR
125.0000 mg | Freq: Once | INTRAMUSCULAR | Status: AC
  Administered 2013-12-07: 125 mg via INTRAVENOUS
  Filled 2013-12-07: qty 2

## 2013-12-07 MED ORDER — PREDNISONE (PAK) 10 MG PO TABS: ORAL_TABLET | Freq: Every day | ORAL | Status: DC

## 2013-12-07 MED ORDER — NITROFURANTOIN MONOHYD MACRO 100 MG PO CAPS: 100.0000 mg | ORAL_CAPSULE | Freq: Two times a day (BID) | ORAL | Status: DC

## 2013-12-07 MED ORDER — FLUORESCEIN SODIUM 1 MG OP STRP
ORAL_STRIP | OPHTHALMIC | Status: AC
  Filled 2013-12-07: qty 1

## 2013-12-07 MED ORDER — DIPHENHYDRAMINE HCL 50 MG/ML IJ SOLN
50.0000 mg | Freq: Once | INTRAMUSCULAR | Status: AC
  Administered 2013-12-07: 50 mg via INTRAVENOUS
  Filled 2013-12-07: qty 1

## 2013-12-07 MED ORDER — DIPHENHYDRAMINE HCL 50 MG/ML IJ SOLN
INTRAMUSCULAR | Status: AC
  Filled 2013-12-07: qty 1

## 2013-12-07 MED ORDER — METHYLPREDNISOLONE SODIUM SUCC 125 MG IJ SOLR
INTRAMUSCULAR | Status: AC
  Filled 2013-12-07: qty 2

## 2013-12-07 MED ORDER — SODIUM CHLORIDE 0.9 % IV BOLUS (SEPSIS)
1000.0000 mL | Freq: Once | INTRAVENOUS | Status: AC
  Administered 2013-12-07: 1000 mL via INTRAVENOUS

## 2013-12-07 MED ORDER — DIPHENHYDRAMINE HCL 50 MG/ML IJ SOLN
25.0000 mg | Freq: Once | INTRAMUSCULAR | Status: AC
  Administered 2013-12-07: 25 mg via INTRAVENOUS
  Filled 2013-12-07: qty 1

## 2013-12-07 MED ORDER — IOHEXOL 300 MG/ML  SOLN
75.0000 mL | Freq: Once | INTRAMUSCULAR | Status: AC | PRN
  Administered 2013-12-07: 75 mL via INTRAVENOUS

## 2013-12-07 MED ORDER — FAMOTIDINE IN NACL 20-0.9 MG/50ML-% IV SOLN
20.0000 mg | Freq: Once | INTRAVENOUS | Status: AC
  Administered 2013-12-07: 20 mg via INTRAVENOUS
  Filled 2013-12-07: qty 50

## 2013-12-07 NOTE — Discharge Instructions (Signed)
Drug Allergy (Amoxicillin) Allergic reactions to medicines are common. Some allergic reactions are mild. A delayed type of drug allergy that occurs 1 week or more after exposure to a medicine or vaccine is called serum sickness. A life-threatening, sudden (acute) allergic reaction that involves the whole body is called anaphylaxis. CAUSES  "True" drug allergies occur when there is an allergic reaction to a medicine. This is caused by overactivity of the immune system. First, the body becomes sensitized. The immune system is triggered by your first exposure to the medicine. Following this first exposure, future exposure to the same medicine may be life-threatening. Almost any medicine can cause an allergic reaction. Common ones are:  Penicillin.  Sulfonamides (sulfa drugs).  Local anesthetics.  X-ray dyes that contain iodine. SYMPTOMS  Common symptoms of a minor allergic reaction are:  Swelling around the mouth.  An itchy red rash or hives.  Vomiting or diarrhea. Anaphylaxis can cause swelling of the mouth and throat. This makes it difficult to breathe and swallow. Severe reactions can be fatal within seconds, even after exposure to only a trace amount of the drug that causes the reaction. HOME CARE INSTRUCTIONS   If you are unsure of what caused your reaction, keep a diary of foods and medicines used. Include the symptoms that followed. Avoid anything that causes reactions.  You may want to follow up with an allergy specialist after the reaction has cleared in order to be tested to confirm the allergy. It is important to confirm that your reaction is an allergy, not just a side effect to the medicine. If you have a true allergy to a medicine, this may prevent that medicine and related medicines from being given to you when you are very ill.  If you have hives or a rash:  Take medicines as directed by your caregiver.  You may use an over-the-counter antihistamine (diphenhydramine) as  needed.  Apply cold compresses to the skin or take baths in cool water. Avoid hot baths or showers.  If you are severely allergic:  Continuous observation after a severe reaction may be needed. Hospitalization is often required.  Wear a medical alert bracelet or necklace stating your allergy.  You and your family must learn how to use an anaphylaxis kit or give an epinephrine injection to temporarily treat an emergency allergic reaction. If you have had a severe reaction, always carry your epinephrine injection or anaphylaxis kit with you. This can be lifesaving if you have a severe reaction.  Do not drive or perform tasks after treatment until the medicines used to treat your reaction have worn off, or until your caregiver says it is okay. SEEK MEDICAL CARE IF:   You think you had an allergic reaction. Symptoms usually start within 30 minutes after exposure.  Symptoms are getting worse rather than better.  You develop new symptoms.  The symptoms that brought you to your caregiver return. SEEK IMMEDIATE MEDICAL CARE IF:   You have swelling of the mouth, difficulty breathing, or wheezing.  You have a tight feeling in your chest or throat.  You develop hives, swelling, or itching all over your body.  You develop severe vomiting or diarrhea.  You feel faint or pass out. This is an emergency. Use your epinephrine injection or anaphylaxis kit as you have been instructed. Call for emergency medical help. Even if you improve after the injection, you need to be examined at a hospital emergency department. MAKE SURE YOU:   Understand these instructions.  Will  watch your condition.  Will get help right away if you are not doing well or get worse. Document Released: 01/24/2005 Document Revised: 04/18/2011 Document Reviewed: 06/30/2010 [facility] Patient Information 2015 [place], [place]. This information is not intended to replace advice given to you by your health care provider. Make  sure you discuss any questions you have with your health care provider.   Urinary Tract Infection Urinary tract infections (UTIs) can develop anywhere along your urinary tract. Your urinary tract is your body's drainage system for removing wastes and extra water. Your urinary tract includes two kidneys, two ureters, a bladder, and a urethra. Your kidneys are a pair of bean-shaped organs. Each kidney is about the size of your fist. They are located below your ribs, one on each side of your spine. CAUSES Infections are caused by microbes, which are microscopic organisms, including fungi, viruses, and bacteria. These organisms are so small that they can only be seen through a microscope. Bacteria are the microbes that most commonly cause UTIs. SYMPTOMS  Symptoms of UTIs may vary by age and gender of the patient and by the location of the infection. Symptoms in young women typically include a frequent and intense urge to urinate and a painful, burning feeling in the bladder or urethra during urination. Older women and men are more likely to be tired, shaky, and weak and have muscle aches and abdominal pain. A fever may mean the infection is in your kidneys. Other symptoms of a kidney infection include pain in your back or sides below the ribs, nausea, and vomiting. DIAGNOSIS To diagnose a UTI, your caregiver will ask you about your symptoms. Your caregiver also will ask to provide a urine sample. The urine sample will be tested for bacteria and white blood cells. White blood cells are made by your body to help fight infection. TREATMENT  Typically, UTIs can be treated with medication. Because most UTIs are caused by a bacterial infection, they usually can be treated with the use of antibiotics. The choice of antibiotic and length of treatment depend on your symptoms and the type of bacteria causing your infection. HOME CARE INSTRUCTIONS  If you were prescribed antibiotics, take them exactly as your  caregiver instructs you. Finish the medication even if you feel better after you have only taken some of the medication.  Drink enough water and fluids to keep your urine clear or pale yellow.  Avoid caffeine, tea, and carbonated beverages. They tend to irritate your bladder.  Empty your bladder often. Avoid holding urine for long periods of time.  Empty your bladder before and after sexual intercourse.  After a bowel movement, women should cleanse from front to back. Use each tissue only once. SEEK MEDICAL CARE IF:   You have back pain.  You develop a fever.  Your symptoms do not begin to resolve within 3 days. SEEK IMMEDIATE MEDICAL CARE IF:   You have severe back pain or lower abdominal pain.  You develop chills.  You have nausea or vomiting.  You have continued burning or discomfort with urination. MAKE SURE YOU:   Understand these instructions.  Will watch your condition.  Will get help right away if you are not doing well or get worse. Document Released: 11/03/2004 Document Revised: 07/26/2011 Document Reviewed: 03/04/2011 [facility] Patient Information 2015 [place], [place]. This information is not intended to replace advice given to you by your health care provider. Make sure you discuss any questions you have with your health care provider.

## 2013-12-07 NOTE — ED Notes (Addendum)
Pt reports she was seen this morning and treated for an allergic reaction to amoxicillin - reports she went home and took Bactroban ointment to nares and noticed difficulty swallowing after one hour after use. Reports taking Benadryl 50mg  PO @ 1530.

## 2013-12-07 NOTE — ED Notes (Signed)
Pt c/o difficulty swallowing but denies sore throat or dyspnea. Pt took benadryl without relief.

## 2013-12-07 NOTE — ED Provider Notes (Signed)
CSN: [id number]     Arrival date & time 12/07/13  [id number] History   First MD Initiated Contact with Patient 12/07/13 0830     Chief Complaint  Patient presents with  . Dysphagia     (Consider location/radiation/quality/duration/timing/severity/associated sxs/prior Treatment) HPI Comments: Patient presents to the ER for evaluation of difficulty swallowing. Patient reports that she woke up and noticed that she was hoarse. She then started to have difficulty swallowing. She denies any sore throat. There is no difficulty breathing. Patient reports that she is currently on amoxicillin for a urinary tract infection and also using mupirocin nasal because she is going to have knee surgery in 2 days. She has not noticed any outward rash, however. There is no nausea, vomiting or abdominal pain. Patient reports that the difficulty swallowing has rapidly worsened in the last hour or so.   Past Medical History  Diagnosis Date  . Thyroid disease   . Hypertension   . Asthma     rare exposure to smoke or other irritatnt   . Hypothyroidism   . Depression   . Anxiety   . GERD (gastroesophageal reflux disease)   . Arthritis     L knee   . Neuromuscular disorder     tourette syndrome, followed by neurology,neuropathy   . Hyperinsulinemia   . Diabetes mellitus without complication     in combination with hyperinsulinnemia    Past Surgical History  Procedure Laterality Date  . Tonsillectomy    . Cholecystectomy    . Gastric bypass    . Cervical ablation    . 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 [facility] 2014    [facility]   . Cardiac catheterization      done for stress related event[place]- told that every thing was ok, punctured femoral artery- treated /w Plasma    No family history on file. History  Substance Use Topics  . Smoking status: Never Smoker   . Smokeless tobacco: Not on file  .  Alcohol Use: No   OB History   Grav Para Term Preterm Abortions TAB SAB Ect Mult Living                 Review of Systems  HENT: Positive for trouble swallowing. Negative for sore throat.   Skin: Negative for rash.  All other systems reviewed and are negative.     Allergies  Dilaudid; Metoclopramide; Oxycodone; and Adhesive  Home Medications   Prior to Admission medications   Medication Sig Start Date End Date Taking? Authorizing Provider  albuterol (PROVENTIL HFA;VENTOLIN HFA) 108 (90 BASE) MCG/ACT inhaler Inhale 1 puff into the lungs every 6 (six) hours as needed for wheezing.     Historical Provider, MD  calcium carbonate (OS-CAL) 600 MG TABS tablet Take 600 mg by mouth daily with breakfast.    Historical Provider, MD  carisoprodol (SOMA) 350 MG tablet Take 350 mg by mouth 3 (three) times daily as needed for muscle spasms.     Historical Provider, MD  Cholecalciferol (VITAMIN D) 2000 UNITS CAPS Take 1 capsule by mouth 2 (two) times daily.    Historical Provider, MD  clonazePAM (KLONOPIN) 1 MG tablet Take 0.5 mg by mouth every 8 (eight) hours.     Historical Provider, MD  DULoxetine (CYMBALTA) 60 MG capsule Take 60 mg by mouth daily before breakfast.  Historical Provider, MD  fluticasone (FLONASE) 50 MCG/ACT nasal spray Place 2 sprays into the nose daily.  06/06/12   Historical Provider, MD  gabapentin (NEURONTIN) 300 MG capsule Take 300 mg by mouth 3 (three) times daily.    Historical Provider, MD  haloperidol (HALDOL) 0.5 MG tablet Take 0.5 mg by mouth every 8 (eight) hours as needed for agitation.  04/27/12   Historical Provider, MD  HYDROcodone-acetaminophen (NORCO/VICODIN) 5-325 MG per tablet Take 1 tablet by mouth every 6 (six) hours as needed (pain).  02/23/13   Historical Provider, MD  lamoTRIgine (LAMICTAL) 200 MG tablet Take 400 mg by mouth at bedtime.    Historical Provider, MD  levothyroxine (SYNTHROID, LEVOTHROID) 50 MCG tablet Take 50 mcg by mouth daily before  breakfast.    Historical Provider, MD  lisinopril (PRINIVIL,ZESTRIL) 40 MG tablet Take 40 mg by mouth daily.    Historical Provider, MD  meclizine (ANTIVERT) 25 MG tablet Take 25 mg by mouth 2 (two) times daily as needed for dizziness or nausea.  05/15/13 05/15/14  Historical Provider, MD  metFORMIN (GLUCOPHAGE-XR) 500 MG 24 hr tablet Take 500 mg by mouth every evening.  07/29/13   Historical Provider, MD  montelukast (SINGULAIR) 10 MG tablet Take 10 mg by mouth daily before breakfast.     Historical Provider, MD  Multiple Vitamin (MULTIVITAMIN WITH MINERALS) TABS tablet Take 1 tablet by mouth daily.    Historical Provider, MD  omeprazole (PRILOSEC) 20 MG capsule Take 20 mg by mouth 2 (two) times daily before a meal.     Historical Provider, MD  Oxcarbazepine (TRILEPTAL) 300 MG tablet Take 150 mg by mouth 2 (two) times daily.    Historical Provider, MD  traZODone (DESYREL) 150 MG tablet Take 75 mg by mouth at bedtime.     Historical Provider, MD  verapamil (CALAN-SR) 120 MG CR tablet Take 120 mg by mouth at bedtime.  07/29/13 07/29/14  Historical Provider, MD   BP 136/91  Pulse 70  Temp(Src) 98.3 F (36.8 C) (Oral)  Resp 16  Ht 5\' 3"  (1.6 m)  Wt 160 lb (72.576 kg)  BMI 28.35 kg/m2  SpO2 100%  LMP 11/29/2008 Physical Exam  Constitutional: She is oriented to person, place, and time. She appears well-developed and well-nourished. No distress.  HENT:  Head: Normocephalic and atraumatic.  Right Ear: Hearing normal.  Left Ear: Hearing normal.  Nose: Nose normal.  Mouth/Throat: Oropharynx is clear and moist and mucous membranes are normal.  Eyes: Conjunctivae and EOM are normal. Pupils are equal, round, and reactive to light.  Neck: Normal range of motion. Neck supple.  Cardiovascular: Regular rhythm, S1 normal and S2 normal.  Exam reveals no gallop and no friction rub.   No murmur heard. Pulmonary/Chest: Effort normal and breath sounds normal. No respiratory distress. She exhibits no tenderness.   Abdominal: Soft. Normal appearance and bowel sounds are normal. There is no hepatosplenomegaly. There is no tenderness. There is no rebound, no guarding, no tenderness at McBurney's point and negative Murphy's sign. No hernia.  Musculoskeletal: Normal range of motion.  Neurological: She is alert and oriented to person, place, and time. She has normal strength. No cranial nerve deficit or sensory deficit. Coordination normal. GCS eye subscore is 4. GCS verbal subscore is 5. GCS motor subscore is 6.  Skin: Skin is warm, dry and intact. No rash noted. No cyanosis.  Psychiatric: She has a normal mood and affect. Her speech is normal and behavior is normal. Thought content normal.  ED Course  Procedures (including critical care time) Labs Review Labs Reviewed  CBC WITH DIFFERENTIAL  BASIC METABOLIC PANEL  URINALYSIS, ROUTINE W REFLEX MICROSCOPIC    Imaging Review No results found.   EKG Interpretation None      MDM   Final diagnoses:  None   allergic reaction uti  Patient presented to the ER for evaluation of difficulty swallowing. Patient was handling her secretions. There was no difficulty breathing. There was no oropharyngeal swelling on examination. Patient was administered IV Benadryl and IV Solu-Medrol, as she is currently on amoxicillin and allergic response was considered. She did have significant improvement, symptoms resolved. She was monitored for a period of time without any rebound effect. Patient will be discharged on prednisone taper, continue Benadryl for the next 2 days, then as needed. Patient was given return precautions.  Patient was prescribed amoxicillin for urinary tract infection. Urinalysis still appears consistent with UTI. Will be switched to Macrodantin.   Gilda Creasehristopher J. Jazziel Fitzsimmons, MD 12/07/13 [phone]

## 2013-12-07 NOTE — ED Notes (Signed)
Patient drinking diet coke

## 2013-12-07 NOTE — ED Notes (Signed)
Patient tolerating PO fluids. States that she feels better at this time.

## 2013-12-07 NOTE — Discharge Instructions (Signed)
Continue prescribe medications from previous visit.

## 2013-12-07 NOTE — ED Provider Notes (Signed)
CSN: [id number]     Arrival date & time 12/07/13  1607 History   First MD Initiated Contact with Patient 12/07/13 1628     Chief Complaint  Patient presents with  . Allergic Reaction     (Consider location/radiation/quality/duration/timing/severity/associated sxs/prior Treatment) HPI Comments: Patient is a 48 year old female who was seen a few hours ago in the ED for dysphagia who presents with recurring symptoms. Patient reports not being able to pick up her prescriptions until the evening and she started to have recurring symptoms. Patient reports sudden onset of trouble swallowing follow putting bactroban in her nares for MRSA infection-instructed by her Orthopedist for pre-op. Patient did not try anything for symptoms. No aggravating/alleviating factors.    Past Medical History  Diagnosis Date  . Thyroid disease   . Hypertension   . Asthma     rare exposure to smoke or other irritatnt   . Hypothyroidism   . Depression   . Anxiety   . GERD (gastroesophageal reflux disease)   . Arthritis     L knee   . Neuromuscular disorder     tourette syndrome, followed by neurology,neuropathy   . Hyperinsulinemia   . Diabetes mellitus without complication     in combination with hyperinsulinnemia    Past Surgical History  Procedure Laterality Date  . Tonsillectomy    . Cholecystectomy    . Gastric bypass    . Cervical ablation    . 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 Total knee arthroplasty Left 2014    at [facility]   . Cardiac catheterization      done for stress related event, in [place] , [place]- told that every thing was ok, punctured femoral artery- treated /w Plasma    History reviewed. No pertinent family history. History  Substance Use Topics  . Smoking status: Never Smoker   . Smokeless tobacco: Not on file  . Alcohol Use: No   OB History   Grav Para Term Preterm Abortions TAB SAB Ect Mult Living                  Review of Systems  HENT: Positive for trouble swallowing.   All other systems reviewed and are negative.     Allergies  Amoxicillin; Dilaudid; Metoclopramide; Oxycodone; and Adhesive  Home Medications   Prior to Admission medications   Medication Sig Start Date End Date Taking? Authorizing Provider  albuterol (PROVENTIL HFA;VENTOLIN HFA) 108 (90 BASE) MCG/ACT inhaler Inhale 1 puff into the lungs every 6 (six) hours as needed for wheezing.     Historical Provider, MD  calcium carbonate (OS-CAL) 600 MG TABS tablet Take 600 mg by mouth daily with breakfast.    Historical Provider, MD  carisoprodol (SOMA) 350 MG tablet Take 350 mg by mouth 3 (three) times daily as needed for muscle spasms.     Historical Provider, MD  Cholecalciferol (VITAMIN D) 2000 UNITS CAPS Take 1 capsule by mouth 2 (two) times daily.    Historical Provider, MD  clonazePAM (KLONOPIN) 1 MG tablet Take 0.5 mg by mouth every 8 (eight) hours.     Historical Provider, MD  DULoxetine (CYMBALTA) 60 MG capsule Take 60 mg by mouth daily before breakfast.     Historical Provider, MD  fluticasone (FLONASE) 50 MCG/ACT nasal spray Place 2 sprays into the nose daily.  06/06/12   Historical Provider, MD  gabapentin (NEURONTIN) 300  MG capsule Take 300 mg by mouth 3 (three) times daily.    Historical Provider, MD  haloperidol (HALDOL) 0.5 MG tablet Take 0.5 mg by mouth every 8 (eight) hours as needed for agitation.  04/27/12   Historical Provider, MD  HYDROcodone-acetaminophen (NORCO/VICODIN) 5-325 MG per tablet Take 1 tablet by mouth every 6 (six) hours as needed (pain).  02/23/13   Historical Provider, MD  lamoTRIgine (LAMICTAL) 200 MG tablet Take 400 mg by mouth at bedtime.    Historical Provider, MD  levothyroxine (SYNTHROID, LEVOTHROID) 50 MCG tablet Take 50 mcg by mouth daily before breakfast.    Historical Provider, MD  lisinopril (PRINIVIL,ZESTRIL) 40 MG tablet Take 40 mg by mouth daily.    Historical Provider, MD   meclizine (ANTIVERT) 25 MG tablet Take 25 mg by mouth 2 (two) times daily as needed for dizziness or nausea.  05/15/13 05/15/14  Historical Provider, MD  metFORMIN (GLUCOPHAGE-XR) 500 MG 24 hr tablet Take 500 mg by mouth every evening.  07/29/13   Historical Provider, MD  montelukast (SINGULAIR) 10 MG tablet Take 10 mg by mouth daily before breakfast.     Historical Provider, MD  Multiple Vitamin (MULTIVITAMIN WITH MINERALS) TABS tablet Take 1 tablet by mouth daily.    Historical Provider, MD  nitrofurantoin, macrocrystal-monohydrate, (MACROBID) 100 MG capsule Take 1 capsule (100 mg total) by mouth 2 (two) times daily. X 7 days 12/07/13   Gilda Creasehristopher J. Pollina, MD  omeprazole (PRILOSEC) 20 MG capsule Take 20 mg by mouth 2 (two) times daily before a meal.     Historical Provider, MD  Oxcarbazepine (TRILEPTAL) 300 MG tablet Take 150 mg by mouth 2 (two) times daily.    Historical Provider, MD  predniSONE (STERAPRED UNI-PAK) 10 MG tablet Take by mouth daily. As directed on pack - 6 day taper pack 12/07/13   Gilda Creasehristopher J. Pollina, MD  traZODone (DESYREL) 150 MG tablet Take 75 mg by mouth at bedtime.     Historical Provider, MD  verapamil (CALAN-SR) 120 MG CR tablet Take 120 mg by mouth at bedtime.  07/29/13 07/29/14  Historical Provider, MD   BP 142/89  Pulse 97  Temp(Src) 98.3 F (36.8 C) (Oral)  Resp 19  Ht 5\' 4"  (1.626 m)  Wt 160 lb (72.576 kg)  BMI 27.45 kg/m2  SpO2 100%  LMP 11/29/2008 Physical Exam  Nursing note and vitals reviewed. Constitutional: She is oriented to person, place, and time. She appears well-developed and well-nourished. No distress.  HENT:  Head: Normocephalic and atraumatic.  Eyes: Conjunctivae and EOM are normal.  Neck: Normal range of motion.  Cardiovascular: Normal rate and regular rhythm.  Exam reveals no gallop and no friction rub.   No murmur heard. Pulmonary/Chest: Effort normal and breath sounds normal. She has no wheezes. She has no rales. She exhibits no  tenderness.  Abdominal: Soft. There is no tenderness.  Musculoskeletal: Normal range of motion.  Neurological: She is alert and oriented to person, place, and time. Coordination normal.  Speech is goal-oriented. Moves limbs without ataxia.   Skin: Skin is warm and dry.  Psychiatric: She has a normal mood and affect. Her behavior is normal.    ED Course  Procedures (including critical care time) Labs Review Labs Reviewed  CBC WITH DIFFERENTIAL - Abnormal; Notable for the following:    Neutrophils Relative % 91 (*)    Neutro Abs 9.1 (*)    Lymphocytes Relative 8 (*)    Monocytes Relative 1 (*)    All  other components within normal limits  BASIC METABOLIC PANEL - Abnormal; Notable for the following:    Glucose, Bld 138 (*)    Anion gap 17 (*)    All other components within normal limits  CBG MONITORING, ED - Abnormal; Notable for the following:    Glucose-Capillary 130 (*)    All other components within normal limits    Imaging Review Ct Soft Tissue Neck W Contrast  12/07/2013   CLINICAL DATA:  Dysphagia.  EXAM: CT NECK WITH CONTRAST  TECHNIQUE: Multidetector CT imaging of the neck was performed using the standard protocol following the bolus administration of intravenous contrast.  CONTRAST:  75mL OMNIPAQUE IOHEXOL 300 MG/ML  SOLN  COMPARISON:  None.  FINDINGS: Base of brain is unremarkable. Visualized orbits unremarkable. The visualized paranasal sinuses are clear. Mastoids are clear.  Nasopharynx, oropharynx, larynx, trachea appear normal. Thyroid unremarkable. Tongue and tongue base appear normal. Parapharyngeal space is normal. Parotid and submandibular glands are normal. Shotty submandibular, submental, and anterior cervical lymph nodes. Cervical vascular structures are unremarkable. Retropharyngeal space is normal. No acute bony abnormality. Pulmonary apices are clear. A cutaneous/subcutaneous 1.5 cm soft tissue density is noted along the right lower posterior neck/ shoulder.  Clinical correlation is suggested.  IMPRESSION: 1. 1.5 cm right posterior neck/shoulder subcutaneous/cutaneous lesion. Clinical evaluation suggested.  2.  Shotty submental, submandibular, and cervical lymph nodes.   Electronically Signed   By: Maisie Fushomas  Register   On: 12/07/2013 19:19     EKG Interpretation None      MDM   Final diagnoses:  Dysphagia   5:18 PM Patient will have solumedrol, benadryl, pepcid, and fluids. Vitals stable and patient afebrile. Patient tolerating secretions and in no acute distress.   8:53 PM Patient's CT neck shows patent airway and throat. Patient reports relief and is "able to swallow now." Patient will be discharged with instructions to take medications prescribed earlier.   Emilia BeckKaitlyn Kalief Kattner, PA-C 12/07/13 2057  Rolland PorterMark James, MD 12/13/13 [phone]

## 2013-12-08 MED ORDER — SODIUM CHLORIDE 0.9 % IV SOLN
INTRAVENOUS | Status: DC
  Administered 2013-12-09: 13:00:00 via INTRAVENOUS

## 2013-12-08 MED ORDER — CHLORHEXIDINE GLUCONATE 4 % EX LIQD
60.0000 mL | Freq: Once | CUTANEOUS | Status: DC
  Filled 2013-12-08: qty 60

## 2013-12-08 MED ORDER — BUPIVACAINE LIPOSOME 1.3 % IJ SUSP
20.0000 mL | Freq: Once | INTRAMUSCULAR | Status: DC
Start: 2013-12-08 — End: 2013-12-09
  Filled 2013-12-08: qty 20

## 2013-12-08 MED ORDER — CEFAZOLIN SODIUM-DEXTROSE 2-3 GM-% IV SOLR
2.0000 g | INTRAVENOUS | Status: AC
  Administered 2013-12-09: 2 g via INTRAVENOUS
  Filled 2013-12-08: qty 50

## 2013-12-08 MED ORDER — TRANEXAMIC ACID 100 MG/ML IV SOLN
1000.0000 mg | INTRAVENOUS | Status: DC
  Filled 2013-12-08: qty 10

## 2013-12-09 ENCOUNTER — Inpatient Hospital Stay (HOSPITAL_COMMUNITY): Payer: Medicare Other | Admitting: Anesthesiology

## 2013-12-09 ENCOUNTER — Inpatient Hospital Stay (HOSPITAL_COMMUNITY)
Admission: RE | Admit: 2013-12-09 | Discharge: 2013-12-10 | DRG: 464 | Disposition: A | Discharge status: Home Health | Payer: Medicare Other | Source: Ambulatory Visit | Attending: Orthopedic Surgery | Admitting: Orthopedic Surgery

## 2013-12-09 ENCOUNTER — Encounter (HOSPITAL_COMMUNITY): Payer: Self-pay | Admitting: *Deleted

## 2013-12-09 ENCOUNTER — Encounter (HOSPITAL_COMMUNITY)
Admission: RE | Disposition: A | Discharge status: Home Health | Payer: Self-pay | Source: Ambulatory Visit | Attending: Orthopedic Surgery

## 2013-12-09 DIAGNOSIS — E118 Type 2 diabetes mellitus with unspecified complications: Secondary | ICD-10-CM | POA: Diagnosis not present

## 2013-12-09 DIAGNOSIS — E039 Hypothyroidism, unspecified: Secondary | ICD-10-CM | POA: Diagnosis present

## 2013-12-09 DIAGNOSIS — Z9884 Bariatric surgery status: Secondary | ICD-10-CM | POA: Diagnosis not present

## 2013-12-09 DIAGNOSIS — M24662 Ankylosis, left knee: Principal | ICD-10-CM | POA: Diagnosis present

## 2013-12-09 DIAGNOSIS — J45909 Unspecified asthma, uncomplicated: Secondary | ICD-10-CM | POA: Diagnosis present

## 2013-12-09 DIAGNOSIS — Z96652 Presence of left artificial knee joint: Secondary | ICD-10-CM | POA: Diagnosis present

## 2013-12-09 DIAGNOSIS — K219 Gastro-esophageal reflux disease without esophagitis: Secondary | ICD-10-CM | POA: Diagnosis present

## 2013-12-09 DIAGNOSIS — Z79899 Other long term (current) drug therapy: Secondary | ICD-10-CM

## 2013-12-09 DIAGNOSIS — Z96659 Presence of unspecified artificial knee joint: Secondary | ICD-10-CM

## 2013-12-09 DIAGNOSIS — G629 Polyneuropathy, unspecified: Secondary | ICD-10-CM | POA: Diagnosis present

## 2013-12-09 DIAGNOSIS — F419 Anxiety disorder, unspecified: Secondary | ICD-10-CM | POA: Diagnosis present

## 2013-12-09 DIAGNOSIS — F329 Major depressive disorder, single episode, unspecified: Secondary | ICD-10-CM | POA: Diagnosis present

## 2013-12-09 DIAGNOSIS — F952 Tourette's disorder: Secondary | ICD-10-CM | POA: Diagnosis present

## 2013-12-09 DIAGNOSIS — I10 Essential (primary) hypertension: Secondary | ICD-10-CM | POA: Diagnosis present

## 2013-12-09 DIAGNOSIS — E119 Type 2 diabetes mellitus without complications: Secondary | ICD-10-CM | POA: Diagnosis present

## 2013-12-09 DIAGNOSIS — D62 Acute posthemorrhagic anemia: Secondary | ICD-10-CM | POA: Diagnosis not present

## 2013-12-09 DIAGNOSIS — R1310 Dysphagia, unspecified: Secondary | ICD-10-CM | POA: Diagnosis present

## 2013-12-09 HISTORY — PX: REVISION TOTAL KNEE ARTHROPLASTY: SUR1280

## 2013-12-09 HISTORY — PX: EXCISIONAL TOTAL KNEE ARTHROPLASTY: SHX5015

## 2013-12-09 LAB — CBC
HCT: 38.9 % (ref 36.0–46.0)
Hemoglobin: 12.9 g/dL (ref 12.0–15.0)
MCH: 29.2 pg (ref 26.0–34.0)
MCHC: 33.2 g/dL (ref 30.0–36.0)
MCV: 88 fL (ref 78.0–100.0)
PLATELETS: 300 10*3/uL (ref 150–400)
RBC: 4.42 MIL/uL (ref 3.87–5.11)
RDW: 12.8 % (ref 11.5–15.5)
WBC: 11.1 10*3/uL — AB (ref 4.0–10.5)

## 2013-12-09 LAB — CREATININE, SERUM
CREATININE: 0.48 mg/dL — AB (ref 0.50–1.10)
GFR calc Af Amer: >90 mL/min (ref >90)
GFR calc non Af Amer: >90 mL/min (ref >90)

## 2013-12-09 LAB — GLUCOSE, CAPILLARY
GLUCOSE-CAPILLARY: 83 mg/dL (ref 70–99)
GLUCOSE-CAPILLARY: 84 mg/dL (ref 70–99)
GLUCOSE-CAPILLARY: 103 mg/dL — AB (ref 70–99)

## 2013-12-09 SURGERY — EXCISIONAL TOTAL KNEE ARTHROPLASTY
Anesthesia: General | Laterality: Left

## 2013-12-09 MED ORDER — MEPERIDINE HCL 25 MG/ML IJ SOLN: 6.2500 mg | INTRAMUSCULAR | Status: DC | PRN

## 2013-12-09 MED ORDER — DIPHENHYDRAMINE HCL 12.5 MG/5ML PO ELIX: 12.5000 mg | ORAL_SOLUTION | ORAL | Status: DC | PRN

## 2013-12-09 MED ORDER — FENTANYL CITRATE 0.05 MG/ML IJ SOLN: 100.0000 ug | Freq: Once | INTRAMUSCULAR | Status: DC

## 2013-12-09 MED ORDER — DEXTROSE 5 % IV SOLN
INTRAVENOUS | Status: DC | PRN
  Administered 2013-12-09: 10:00:00 via INTRAVENOUS

## 2013-12-09 MED ORDER — ENOXAPARIN SODIUM 30 MG/0.3ML ~~LOC~~ SOLN
30.0000 mg | Freq: Two times a day (BID) | SUBCUTANEOUS | Status: DC
  Administered 2013-12-10: 30 mg via SUBCUTANEOUS
  Filled 2013-12-09 (×3): qty 0.3

## 2013-12-09 MED ORDER — MECLIZINE HCL 25 MG PO TABS
25.0000 mg | ORAL_TABLET | Freq: Two times a day (BID) | ORAL | Status: DC | PRN
  Filled 2013-12-09: qty 1

## 2013-12-09 MED ORDER — LACTATED RINGERS IV SOLN
INTRAVENOUS | Status: DC
  Administered 2013-12-09: 09:00:00 via INTRAVENOUS

## 2013-12-09 MED ORDER — LACTATED RINGERS IV SOLN
INTRAVENOUS | Status: DC | PRN
  Administered 2013-12-09 (×2): via INTRAVENOUS

## 2013-12-09 MED ORDER — ONDANSETRON HCL 4 MG/2ML IJ SOLN
INTRAMUSCULAR | Status: AC
  Filled 2013-12-09: qty 2

## 2013-12-09 MED ORDER — ACETAMINOPHEN 325 MG PO TABS: 650.0000 mg | ORAL_TABLET | Freq: Four times a day (QID) | ORAL | Status: DC | PRN

## 2013-12-09 MED ORDER — ARTIFICIAL TEARS OP OINT
TOPICAL_OINTMENT | OPHTHALMIC | Status: AC
  Filled 2013-12-09: qty 3.5

## 2013-12-09 MED ORDER — HYDROMORPHONE HCL 1 MG/ML IJ SOLN
INTRAMUSCULAR | Status: AC
  Filled 2013-12-09: qty 1

## 2013-12-09 MED ORDER — LEVOTHYROXINE SODIUM 50 MCG PO TABS
50.0000 ug | ORAL_TABLET | Freq: Every day | ORAL | Status: DC
  Administered 2013-12-10: 50 ug via ORAL
  Filled 2013-12-09 (×2): qty 1

## 2013-12-09 MED ORDER — BUPIVACAINE LIPOSOME 1.3 % IJ SUSP
INTRAMUSCULAR | Status: DC | PRN
  Administered 2013-12-09: 20 mL

## 2013-12-09 MED ORDER — LIDOCAINE HCL (CARDIAC) 20 MG/ML IV SOLN
INTRAVENOUS | Status: AC
  Filled 2013-12-09: qty 5

## 2013-12-09 MED ORDER — FLEET ENEMA 7-19 GM/118ML RE ENEM: 1.0000 | ENEMA | Freq: Once | RECTAL | Status: AC | PRN

## 2013-12-09 MED ORDER — FLUTICASONE PROPIONATE 50 MCG/ACT NA SUSP
2.0000 | Freq: Every day | NASAL | Status: DC
  Administered 2013-12-09 – 2013-12-10 (×2): 2 via NASAL
  Filled 2013-12-09: qty 16

## 2013-12-09 MED ORDER — SENNOSIDES-DOCUSATE SODIUM 8.6-50 MG PO TABS: 1.0000 | ORAL_TABLET | Freq: Every evening | ORAL | Status: DC | PRN

## 2013-12-09 MED ORDER — HYDROCODONE-ACETAMINOPHEN 10-325 MG PO TABS
1.0000 | ORAL_TABLET | ORAL | Status: DC | PRN
  Administered 2013-12-09 – 2013-12-10 (×5): 2 via ORAL
  Filled 2013-12-09 (×6): qty 2

## 2013-12-09 MED ORDER — ALUM & MAG HYDROXIDE-SIMETH 200-200-20 MG/5ML PO SUSP: 30.0000 mL | ORAL | Status: DC | PRN

## 2013-12-09 MED ORDER — ONDANSETRON HCL 4 MG/2ML IJ SOLN
INTRAMUSCULAR | Status: DC | PRN
  Administered 2013-12-09: 4 mg via INTRAVENOUS

## 2013-12-09 MED ORDER — MIDAZOLAM HCL 5 MG/5ML IJ SOLN
INTRAMUSCULAR | Status: DC | PRN
  Administered 2013-12-09 (×2): 0.5 mg via INTRAVENOUS

## 2013-12-09 MED ORDER — FENTANYL CITRATE 0.05 MG/ML IJ SOLN
INTRAMUSCULAR | Status: AC
  Filled 2013-12-09: qty 5

## 2013-12-09 MED ORDER — ALBUTEROL SULFATE (2.5 MG/3ML) 0.083% IN NEBU: 3.0000 mL | INHALATION_SOLUTION | Freq: Four times a day (QID) | RESPIRATORY_TRACT | Status: DC | PRN

## 2013-12-09 MED ORDER — BUPIVACAINE-EPINEPHRINE (PF) 0.25% -1:200000 IJ SOLN
INTRAMUSCULAR | Status: AC
  Filled 2013-12-09: qty 30

## 2013-12-09 MED ORDER — SUCCINYLCHOLINE CHLORIDE 20 MG/ML IJ SOLN
INTRAMUSCULAR | Status: DC | PRN
  Administered 2013-12-09: 110 mg via INTRAVENOUS

## 2013-12-09 MED ORDER — PROPOFOL 10 MG/ML IV BOLUS
INTRAVENOUS | Status: AC
  Filled 2013-12-09: qty 20

## 2013-12-09 MED ORDER — NEOSTIGMINE METHYLSULFATE 10 MG/10ML IV SOLN
INTRAVENOUS | Status: DC | PRN
  Administered 2013-12-09: 4 mg via INTRAVENOUS

## 2013-12-09 MED ORDER — MONTELUKAST SODIUM 10 MG PO TABS
10.0000 mg | ORAL_TABLET | Freq: Every day | ORAL | Status: DC
  Administered 2013-12-10: 10 mg via ORAL
  Filled 2013-12-09 (×2): qty 1

## 2013-12-09 MED ORDER — OXYCODONE HCL 5 MG PO TABS: 5.0000 mg | ORAL_TABLET | Freq: Once | ORAL | Status: DC | PRN

## 2013-12-09 MED ORDER — MIDAZOLAM HCL 5 MG/ML IJ SOLN: 2.0000 mg | Freq: Once | INTRAMUSCULAR | Status: DC

## 2013-12-09 MED ORDER — GABAPENTIN 300 MG PO CAPS
300.0000 mg | ORAL_CAPSULE | Freq: Three times a day (TID) | ORAL | Status: DC
  Administered 2013-12-09 – 2013-12-10 (×4): 300 mg via ORAL
  Filled 2013-12-09 (×5): qty 1

## 2013-12-09 MED ORDER — HALOPERIDOL 0.5 MG PO TABS
0.5000 mg | ORAL_TABLET | Freq: Three times a day (TID) | ORAL | Status: DC | PRN
  Filled 2013-12-09: qty 1

## 2013-12-09 MED ORDER — MIDAZOLAM HCL 2 MG/2ML IJ SOLN
INTRAMUSCULAR | Status: AC
  Filled 2013-12-09: qty 2

## 2013-12-09 MED ORDER — CARISOPRODOL 350 MG PO TABS
350.0000 mg | ORAL_TABLET | Freq: Three times a day (TID) | ORAL | Status: DC
  Administered 2013-12-09 – 2013-12-10 (×4): 350 mg via ORAL
  Filled 2013-12-09 (×4): qty 1

## 2013-12-09 MED ORDER — GLYCOPYRROLATE 0.2 MG/ML IJ SOLN
INTRAMUSCULAR | Status: AC
  Filled 2013-12-09: qty 3

## 2013-12-09 MED ORDER — ONDANSETRON HCL 4 MG/2ML IJ SOLN: 4.0000 mg | Freq: Once | INTRAMUSCULAR | Status: DC | PRN

## 2013-12-09 MED ORDER — ONDANSETRON HCL 4 MG/2ML IJ SOLN: 4.0000 mg | Freq: Four times a day (QID) | INTRAMUSCULAR | Status: DC | PRN

## 2013-12-09 MED ORDER — LISINOPRIL 40 MG PO TABS
40.0000 mg | ORAL_TABLET | Freq: Every day | ORAL | Status: DC
Start: 2013-12-09 — End: 2013-12-10
  Administered 2013-12-09 – 2013-12-10 (×2): 40 mg via ORAL
  Filled 2013-12-09 (×2): qty 1

## 2013-12-09 MED ORDER — SODIUM CHLORIDE 0.9 % IV SOLN: INTRAVENOUS | Status: DC

## 2013-12-09 MED ORDER — FENTANYL CITRATE 0.05 MG/ML IJ SOLN
INTRAMUSCULAR | Status: DC | PRN
  Administered 2013-12-09: 25 ug via INTRAVENOUS
  Administered 2013-12-09: 100 ug via INTRAVENOUS
  Administered 2013-12-09: 25 ug via INTRAVENOUS

## 2013-12-09 MED ORDER — PROPOFOL 10 MG/ML IV BOLUS
INTRAVENOUS | Status: DC | PRN
  Administered 2013-12-09: 160 mg via INTRAVENOUS

## 2013-12-09 MED ORDER — PHENOL 1.4 % MT LIQD: 1.0000 | OROMUCOSAL | Status: DC | PRN

## 2013-12-09 MED ORDER — DULOXETINE HCL 60 MG PO CPEP
60.0000 mg | ORAL_CAPSULE | Freq: Every day | ORAL | Status: DC
  Administered 2013-12-10: 60 mg via ORAL
  Filled 2013-12-09 (×2): qty 1

## 2013-12-09 MED ORDER — VANCOMYCIN HCL IN DEXTROSE 1-5 GM/200ML-% IV SOLN
1000.0000 mg | Freq: Two times a day (BID) | INTRAVENOUS | Status: AC
Start: 2013-12-09 — End: 2013-12-09
  Administered 2013-12-09: 1000 mg via INTRAVENOUS
  Filled 2013-12-09: qty 200

## 2013-12-09 MED ORDER — FENTANYL CITRATE 0.05 MG/ML IJ SOLN
INTRAMUSCULAR | Status: AC
  Administered 2013-12-09: 100 ug
  Filled 2013-12-09: qty 2

## 2013-12-09 MED ORDER — HYDROMORPHONE HCL 1 MG/ML IJ SOLN
0.2500 mg | INTRAMUSCULAR | Status: DC | PRN
  Administered 2013-12-09 (×2): 0.5 mg via INTRAVENOUS

## 2013-12-09 MED ORDER — BUPIVACAINE LIPOSOME 1.3 % IJ SUSP
20.0000 mL | Freq: Once | INTRAMUSCULAR | Status: DC
  Filled 2013-12-09: qty 20

## 2013-12-09 MED ORDER — BUPIVACAINE-EPINEPHRINE (PF) 0.5% -1:200000 IJ SOLN
INTRAMUSCULAR | Status: DC | PRN
  Administered 2013-12-09: 30 mL

## 2013-12-09 MED ORDER — OXYCODONE HCL 5 MG/5ML PO SOLN: 5.0000 mg | Freq: Once | ORAL | Status: DC | PRN

## 2013-12-09 MED ORDER — PANTOPRAZOLE SODIUM 40 MG PO TBEC
40.0000 mg | DELAYED_RELEASE_TABLET | Freq: Every day | ORAL | Status: DC
  Administered 2013-12-10: 40 mg via ORAL
  Filled 2013-12-09: qty 1

## 2013-12-09 MED ORDER — 0.9 % SODIUM CHLORIDE (POUR BTL) OPTIME
TOPICAL | Status: DC | PRN
  Administered 2013-12-09: 1000 mL

## 2013-12-09 MED ORDER — ZOLPIDEM TARTRATE 5 MG PO TABS: 5.0000 mg | ORAL_TABLET | Freq: Every evening | ORAL | Status: DC | PRN

## 2013-12-09 MED ORDER — MENTHOL 3 MG MT LOZG: 1.0000 | LOZENGE | OROMUCOSAL | Status: DC | PRN

## 2013-12-09 MED ORDER — BISACODYL 5 MG PO TBEC: 5.0000 mg | DELAYED_RELEASE_TABLET | Freq: Every day | ORAL | Status: DC | PRN

## 2013-12-09 MED ORDER — OXCARBAZEPINE 150 MG PO TABS
150.0000 mg | ORAL_TABLET | Freq: Two times a day (BID) | ORAL | Status: DC
  Filled 2013-12-09 (×4): qty 1

## 2013-12-09 MED ORDER — ROCURONIUM BROMIDE 100 MG/10ML IV SOLN
INTRAVENOUS | Status: DC | PRN
  Administered 2013-12-09: 30 mg via INTRAVENOUS

## 2013-12-09 MED ORDER — LAMOTRIGINE 200 MG PO TABS
400.0000 mg | ORAL_TABLET | Freq: Every day | ORAL | Status: DC
  Administered 2013-12-09: 400 mg via ORAL
  Filled 2013-12-09 (×2): qty 2

## 2013-12-09 MED ORDER — BUPIVACAINE-EPINEPHRINE (PF) 0.5% -1:200000 IJ SOLN
INTRAMUSCULAR | Status: AC
  Filled 2013-12-09: qty 30

## 2013-12-09 MED ORDER — ONDANSETRON HCL 4 MG PO TABS
4.0000 mg | ORAL_TABLET | Freq: Four times a day (QID) | ORAL | Status: DC | PRN
  Administered 2013-12-10: 4 mg via ORAL
  Filled 2013-12-09: qty 1

## 2013-12-09 MED ORDER — TRAZODONE 25 MG HALF TABLET
75.0000 mg | ORAL_TABLET | Freq: Every day | ORAL | Status: DC
  Administered 2013-12-09: 75 mg via ORAL
  Filled 2013-12-09 (×2): qty 1

## 2013-12-09 MED ORDER — METFORMIN HCL ER 500 MG PO TB24
500.0000 mg | ORAL_TABLET | Freq: Every day | ORAL | Status: DC
  Administered 2013-12-09 – 2013-12-10 (×2): 500 mg via ORAL
  Filled 2013-12-09 (×2): qty 1

## 2013-12-09 MED ORDER — DEXAMETHASONE SODIUM PHOSPHATE 4 MG/ML IJ SOLN
INTRAMUSCULAR | Status: DC | PRN
  Administered 2013-12-09: 8 mg via INTRAVENOUS

## 2013-12-09 MED ORDER — DEXAMETHASONE SODIUM PHOSPHATE 4 MG/ML IJ SOLN
INTRAMUSCULAR | Status: AC
  Filled 2013-12-09: qty 2

## 2013-12-09 MED ORDER — VERAPAMIL HCL ER 120 MG PO TBCR
120.0000 mg | EXTENDED_RELEASE_TABLET | Freq: Every day | ORAL | Status: DC
Start: 2013-12-09 — End: 2013-12-10
  Administered 2013-12-09: 120 mg via ORAL
  Filled 2013-12-09 (×2): qty 1

## 2013-12-09 MED ORDER — ACETAMINOPHEN 650 MG RE SUPP
650.0000 mg | Freq: Four times a day (QID) | RECTAL | Status: DC | PRN
Start: 2013-12-09 — End: 2013-12-10

## 2013-12-09 MED ORDER — CELECOXIB 200 MG PO CAPS
200.0000 mg | ORAL_CAPSULE | Freq: Two times a day (BID) | ORAL | Status: DC
  Administered 2013-12-09 – 2013-12-10 (×3): 200 mg via ORAL
  Filled 2013-12-09 (×4): qty 1

## 2013-12-09 MED ORDER — CLONAZEPAM 0.5 MG PO TABS
0.5000 mg | ORAL_TABLET | Freq: Three times a day (TID) | ORAL | Status: DC
  Administered 2013-12-09 – 2013-12-10 (×4): 0.5 mg via ORAL
  Filled 2013-12-09 (×4): qty 1

## 2013-12-09 MED ORDER — SODIUM CHLORIDE 0.9 % IR SOLN
Status: DC | PRN
  Administered 2013-12-09: 1000 mL

## 2013-12-09 MED ORDER — DOCUSATE SODIUM 100 MG PO CAPS
100.0000 mg | ORAL_CAPSULE | Freq: Two times a day (BID) | ORAL | Status: DC
  Administered 2013-12-09 – 2013-12-10 (×3): 100 mg via ORAL
  Filled 2013-12-09 (×3): qty 1

## 2013-12-09 MED ORDER — LIDOCAINE HCL (CARDIAC) 20 MG/ML IV SOLN
INTRAVENOUS | Status: DC | PRN
  Administered 2013-12-09: 80 mg via INTRAVENOUS

## 2013-12-09 MED ORDER — ROCURONIUM BROMIDE 50 MG/5ML IV SOLN
INTRAVENOUS | Status: AC
  Filled 2013-12-09: qty 1

## 2013-12-09 MED ORDER — MORPHINE SULFATE 4 MG/ML IJ SOLN
4.0000 mg | INTRAMUSCULAR | Status: DC | PRN
Start: 2013-12-09 — End: 2013-12-10

## 2013-12-09 MED ORDER — MIDAZOLAM HCL 2 MG/2ML IJ SOLN
INTRAMUSCULAR | Status: AC
  Administered 2013-12-09: 2 mg
  Filled 2013-12-09: qty 2

## 2013-12-09 MED ORDER — SUCCINYLCHOLINE CHLORIDE 20 MG/ML IJ SOLN
INTRAMUSCULAR | Status: AC
  Filled 2013-12-09: qty 1

## 2013-12-09 MED ORDER — GLYCOPYRROLATE 0.2 MG/ML IJ SOLN
INTRAMUSCULAR | Status: DC | PRN
  Administered 2013-12-09: 0.4 mg via INTRAVENOUS
  Administered 2013-12-09: 0.2 mg via INTRAVENOUS

## 2013-12-09 SURGICAL SUPPLY — 64 items
BAG URINE DRAINAGE (UROLOGICAL SUPPLIES) IMPLANT
BANDAGE ESMARK 6X9 LF (GAUZE/BANDAGES/DRESSINGS) ×1 IMPLANT
BEARING TIBIAL TRIATHLON SZ 2 (Knees) ×2 IMPLANT
BLADE SAW SGTL 83.5X18.5 (BLADE) IMPLANT
BLADE SAW SGTL NAR THIN XSHT (BLADE) IMPLANT
BLADE SURG 10 STRL SS (BLADE) ×6 IMPLANT
BNDG ESMARK 6X9 LF (GAUZE/BANDAGES/DRESSINGS) ×2
BOWL SMART MIX CTS (DISPOSABLE) IMPLANT
CONT SPEC 4OZ CLIKSEAL STRL BL (MISCELLANEOUS) IMPLANT
COVER BACK TABLE 24X17X13 BIG (DRAPES) IMPLANT
COVER SURGICAL LIGHT HANDLE (MISCELLANEOUS) ×2 IMPLANT
CUFF TOURNIQUET SINGLE 34IN LL (TOURNIQUET CUFF) ×2 IMPLANT
DRAPE EXTREMITY T 121X128X90 (DRAPE) ×2 IMPLANT
DRAPE IMP U-DRAPE 54X76 (DRAPES) ×2 IMPLANT
DRAPE INCISE IOBAN 66X45 STRL (DRAPES) ×4 IMPLANT
DRAPE PROXIMA HALF (DRAPES) IMPLANT
DRAPE U-SHAPE 47X51 STRL (DRAPES) ×2 IMPLANT
DRSG ADAPTIC 3X8 NADH LF (GAUZE/BANDAGES/DRESSINGS) ×2 IMPLANT
DRSG PAD ABDOMINAL 8X10 ST (GAUZE/BANDAGES/DRESSINGS) ×2 IMPLANT
DURAPREP 26ML APPLICATOR (WOUND CARE) ×4 IMPLANT
ELECT REM PT RETURN 9FT ADLT (ELECTROSURGICAL) ×2
ELECTRODE REM PT RTRN 9FT ADLT (ELECTROSURGICAL) ×1 IMPLANT
EVACUATOR 1/8 PVC DRAIN (DRAIN) ×2 IMPLANT
GAUZE SPONGE 4X4 12PLY STRL (GAUZE/BANDAGES/DRESSINGS) ×2 IMPLANT
GEL ULTRASOUND 20GR AQUASONIC (MISCELLANEOUS) IMPLANT
GLOVE BIOGEL PI IND STRL 7.5 (GLOVE) IMPLANT
GLOVE BIOGEL PI IND STRL 8.5 (GLOVE) ×2 IMPLANT
GLOVE BIOGEL PI INDICATOR 7.5 (GLOVE)
GLOVE BIOGEL PI INDICATOR 8.5 (GLOVE) ×2
GLOVE SURG ORTHO 7.0 STRL STRW (GLOVE) IMPLANT
GLOVE SURG ORTHO 8.0 STRL STRW (GLOVE) ×4 IMPLANT
GOWN STRL REUS W/ TWL LRG LVL3 (GOWN DISPOSABLE) ×2 IMPLANT
GOWN STRL REUS W/ TWL XL LVL3 (GOWN DISPOSABLE) ×2 IMPLANT
GOWN STRL REUS W/TWL LRG LVL3 (GOWN DISPOSABLE) ×2
GOWN STRL REUS W/TWL XL LVL3 (GOWN DISPOSABLE) ×2
HANDPIECE INTERPULSE COAX TIP (DISPOSABLE) ×1
HOOD PEEL AWAY FACE SHEILD DIS (HOOD) ×8 IMPLANT
KIT BASIN OR (CUSTOM PROCEDURE TRAY) ×2 IMPLANT
KIT ROOM TURNOVER OR (KITS) ×2 IMPLANT
MANIFOLD NEPTUNE II (INSTRUMENTS) ×2 IMPLANT
NEEDLE 18GX1X1/2 (RX/OR ONLY) (NEEDLE) IMPLANT
NEEDLE HYPO 25GX1X1/2 BEV (NEEDLE) IMPLANT
NS IRRIG 1000ML POUR BTL (IV SOLUTION) ×2 IMPLANT
PACK TOTAL JOINT (CUSTOM PROCEDURE TRAY) ×2 IMPLANT
PACK UNIVERSAL I (CUSTOM PROCEDURE TRAY) ×2 IMPLANT
PAD ARMBOARD 7.5X6 YLW CONV (MISCELLANEOUS) ×4 IMPLANT
PADDING CAST COTTON 6X4 STRL (CAST SUPPLIES) ×2 IMPLANT
SET HNDPC FAN SPRY TIP SCT (DISPOSABLE) ×1 IMPLANT
STAPLER VISISTAT 35W (STAPLE) ×2 IMPLANT
SUCTION FRAZIER TIP 10 FR DISP (SUCTIONS) ×2 IMPLANT
SUT VIC AB 0 CTB1 27 (SUTURE) ×4 IMPLANT
SUT VIC AB 1 CT1 27 (SUTURE) ×3
SUT VIC AB 1 CT1 27XBRD ANBCTR (SUTURE) ×3 IMPLANT
SUT VIC AB 2-0 CT1 27 (SUTURE) ×2
SUT VIC AB 2-0 CT1 TAPERPNT 27 (SUTURE) ×2 IMPLANT
SYR 20CC LL (SYRINGE) IMPLANT
SYR 20ML ECCENTRIC (SYRINGE) IMPLANT
SYR CONTROL 10ML LL (SYRINGE) ×2 IMPLANT
SYRINGE 10CC LL (SYRINGE) IMPLANT
TOWEL OR 17X24 6PK STRL BLUE (TOWEL DISPOSABLE) ×2 IMPLANT
TOWEL OR 17X26 10 PK STRL BLUE (TOWEL DISPOSABLE) ×2 IMPLANT
TRAY FOLEY CATH 16FRSI W/METER (SET/KITS/TRAYS/PACK) IMPLANT
TUBE ANAEROBIC SPECIMEN COL (MISCELLANEOUS) IMPLANT
WATER STERILE IRR 1000ML POUR (IV SOLUTION) IMPLANT

## 2013-12-09 NOTE — Anesthesia Preprocedure Evaluation (Addendum)
Anesthesia Evaluation  Patient identified by MRN, date of birth, ID band Patient awake    Reviewed: Allergy & Precautions, H&P , NPO status , Patient's Chart, lab work & pertinent test results  Airway Mallampati: I  TM Distance: >3 FB Neck ROM: Full    Dental  (+) Teeth Intact, Dental Advisory Given   Pulmonary asthma ,          Cardiovascular hypertension, Pt. on medications     Neuro/Psych    GI/Hepatic GERD-  Controlled and Medicated,  Endo/Other  diabetes, Well Controlled, Type 2, Oral Hypoglycemic Agents  Renal/GU      Musculoskeletal   Abdominal   Peds  Hematology   Anesthesia Other Findings   Reproductive/Obstetrics                            Anesthesia Physical Anesthesia Plan  ASA: II  Anesthesia Plan: General   Post-op Pain Management:    Induction: Intravenous  Airway Management Planned: LMA  Additional Equipment:   Intra-op Plan:   Post-operative Plan: Extubation in OR  Informed Consent: I have reviewed the patients History and Physical, chart, labs and discussed the procedure including the risks, benefits and alternatives for the proposed anesthesia with the patient or authorized representative who has indicated his/her understanding and acceptance.     Plan Discussed with: CRNA and Surgeon  Anesthesia Plan Comments:         Anesthesia Quick Evaluation

## 2013-12-09 NOTE — Anesthesia Postprocedure Evaluation (Signed)
Anesthesia Post Note  Patient: Cynthia Cohen  Procedure(s) Performed: Procedure(s) (LRB): POLYEXCHANGE WITH SYNOVECTOMY (Left)  Anesthesia type: general  Patient location: PACU  Post pain: Pain level controlled  Post assessment: Patient's Cardiovascular Status Stable  Last Vitals:  Filed Vitals:   12/09/13 1338  BP: 152/93  Pulse: 57  Temp: 37 C  Resp: 14    Post vital signs: Reviewed and stable  Level of consciousness: sedated  Complications: No apparent anesthesia complications

## 2013-12-09 NOTE — Transfer of Care (Signed)
Immediate Anesthesia Transfer of Care Note  Patient: Cynthia Cohen  Procedure(s) Performed: Procedure(s): POLYEXCHANGE WITH SYNOVECTOMY (Left)  Patient Location: PACU  Anesthesia Type:General  Level of Consciousness: awake, oriented and patient cooperative  Airway & Oxygen Therapy: Patient Spontanous Breathing and Patient connected to nasal cannula oxygen  Post-op Assessment: Report given to PACU RN, Post -op Vital signs reviewed and stable and Patient moving all extremities  Post vital signs: Reviewed and stable  Complications: No apparent anesthesia complications

## 2013-12-09 NOTE — Progress Notes (Signed)
This nurse was summoned to patient's room stating that she felt she was having a reaction to the IV Vancomycin that had finished infusing at 17:30. Left hand was slightly edematous compared to right hand, knuckles slightly reddened as patient was clenching her hand. No shortness of breath, wheezing. Patient feels since she is allergic to Amoxicillin she is probably allergic to Vancomycin and does not remember if she ever had Vancomycin before. Pharmacy notified of situation and pharmacist does not feel patient had an allergic reaction. Patient insisted her IV be removed at this time as she stated it is painful. Pain subsided after IV removed.

## 2013-12-09 NOTE — Op Note (Signed)
Dication Number:  [id number]

## 2013-12-09 NOTE — Anesthesia Procedure Notes (Addendum)
Anesthesia Regional Block:  Adductor canal block  Pre-Anesthetic Checklist: ,, timeout performed, Correct Patient, Correct Site, Correct Laterality, Correct Procedure, Correct Position, site marked, Risks and benefits discussed,  Surgical consent,  Pre-op evaluation,  At surgeon's request and post-op pain management  Laterality: Left  Prep: chloraprep       Needles:  Injection technique: Single-shot  Needle Type: Echogenic Stimulator Needle     Needle Length: 9cm 9 cm Needle Gauge: 21 and 21 G    Additional Needles:  Procedures: ultrasound guided (picture in chart) Adductor canal block Narrative:  Start time: 12/09/2013 9:10 AM End time: 12/09/2013 9:20 AM Injection made incrementally with aspirations every 5 mL.  Performed by: Personally   Additional Notes: Monitors applied. Patient sedated. Sterile prep and drape,hand hygiene and sterile gloves were used. Relevant anatomy identified.Needle position confirmed.Local anesthetic injected incrementally after negative aspiration. Local anesthetic spread visualized around nerve(s). Vascular puncture avoided. No complications. Image printed for medical record.The patient tolerated the procedure well.    Arta BruceKevin Keana Dueitt MD   Procedure Name: Intubation Date/Time: 12/09/2013 10:23 AM Performed by: Marni GriffonJAMES, KAREN B Pre-anesthesia Checklist: Patient identified, Emergency Drugs available, Suction available and Patient being monitored Patient Re-evaluated:Patient Re-evaluated prior to inductionOxygen Delivery Method: Circle system utilized Preoxygenation: Pre-oxygenation with 100% oxygen Intubation Type: IV induction Ventilation: Mask ventilation without difficulty Laryngoscope Size: Mac and 3 Grade View: Grade II Tube type: Oral Tube size: 7.5 mm Number of attempts: 1 Airway Equipment and Method: Stylet Placement Confirmation: ETT inserted through vocal cords under direct vision,  breath sounds checked- equal and bilateral and positive  ETCO2 Secured at: 21 (cm at teeth) cm Tube secured with: Tape Dental Injury: Teeth and Oropharynx as per pre-operative assessment

## 2013-12-09 NOTE — H&P (Signed)
Cynthia Cohen MRN:  [id number] DOB/SEX:  1965/07/16/female  CHIEF COMPLAINT:  Painful left Knee  HISTORY: Patient is a 48 y.o. female presented with a history of pain in the left knee. Onset of symptoms was gradual starting several years ago with rapidly worsening course since that time. Prior procedures on the knee include arthroplasty. Patient has been treated conservatively with over-the-counter NSAIDs and activity modification. Patient currently rates pain in the knee at 9 out of 10 with activity. There is pain at night.  PAST MEDICAL HISTORY: There are no active problems to display for this patient.  Past Medical History  Diagnosis Date  . Thyroid disease   . Hypertension   . Asthma     rare exposure to smoke or other irritatnt   . Hypothyroidism   . Depression   . Anxiety   . GERD (gastroesophageal reflux disease)   . Arthritis     L knee   . Neuromuscular disorder     tourette syndrome, followed by neurology,neuropathy   . Hyperinsulinemia   . Diabetes mellitus without complication     in combination with hyperinsulinnemia    Past Surgical History  Procedure Laterality Date  . Tonsillectomy    . Cholecystectomy    . Gastric bypass    . Cervical ablation    . 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 [facility] 2014    [facility]   . Cardiac catheterization      done [place] stress related event, [place] , [place]- told that every thing was ok, punctured femoral artery- treated /w Plasma      MEDICATIONS:   No prescriptions prior to admission    ALLERGIES:   Allergies  Allergen Reactions  . Amoxicillin     Difficulty swallowing  . Dilaudid [Hydromorphone Hcl] Other (See Comments)    irritiable  . Metoclopramide Other (See Comments)    jitters  . Oxycodone Other (See Comments)    hallucinate  . Adhesive [Tape] Rash    REVIEW OF SYSTEMS:  A comprehensive review of systems  was negative.   FAMILY HISTORY:  No family history on file.  SOCIAL HISTORY:   History  Substance Use Topics  . Smoking status: Never Smoker   . Smokeless tobacco: Not on file  . Alcohol Use: No     EXAMINATION:  Vital signs in last 24 hours:    General appearance: alert, cooperative and no distress Lungs: clear to auscultation bilaterally Heart: regular rate and rhythm, S1, S2 normal, no murmur, click, rub or gallop Abdomen: soft, non-tender; bowel sounds normal; no masses,  no organomegaly Extremities: extremities normal, atraumatic, no cyanosis or edema and Homans sign is negative, no sign of DVT Pulses: 2+ and symmetric Skin: Skin color, texture, turgor normal. No rashes or lesions  Musculoskeletal:  ROM 5-85, Ligaments intact,  Imaging Review Plain radiographs demonstrate components in alignment from TKA  Assessment/Plan: Left knee pain  The patient history, physical examination and imaging studies are consistent with decreased ROM and pain of the left knee. The patient has failed conservative treatment.  The clearance notes were reviewed.  After discussion with the patient it was felt that a poly swap was indicated.The risks including but not limited to aseptic loosening, infection, blood clots, vascular injury, stiffness, patella tracking problems complications among others were discussed. The patient acknowledged the explanation, agreed to proceed with the plan.  Cynthia Cohen  12/09/2013, 6:26 AM

## 2013-12-09 NOTE — Progress Notes (Signed)
Orthopedic Tech Progress Note Patient Details:  Cynthia Cohen 08-Nov-1965 [id number] CPM applied to LLE with appropriate settings. OHf applied to bed. Footsie roll provided.  CPM Left Knee CPM Left Knee: On Left Knee Flexion (Degrees): 90 Left Knee Extension (Degrees): 0   Asia R Thompson 12/09/2013, 12:25 PM

## 2013-12-09 NOTE — Plan of Care (Signed)
Problem: Phase I Progression Outcomes Goal: Dangle or out of bed evening of surgery Outcome: Completed/Met Date Met:  12/09/13  Problem: Phase II Progression Outcomes Goal: Ambulates Outcome: Completed/Met Date Met:  12/09/13

## 2013-12-09 NOTE — Evaluation (Signed)
Physical Therapy Evaluation Patient Details Name: Cynthia Cohen MRN: [id number] DOB: [birth year] Today's Date: 12/09/2013   History of Present Illness  Patient is a 48 yo female admitted 12/09/13 now s/p Lt TKA.  PMH:  prior Lt TKA, hypothyroidism, HTN, asthma, Tourette syndrome, LLE decreased sensation, DM, depression, anxiety  Clinical Impression  Patient presents with problems listed below.  Will benefit from acute PT to maximize independence prior to return home.  Patient did very well with mobility today, able to ambulate 7182' with RW with supervision.  Should progress well with PT and be ready for d/c tomorrow from PT perspective.    Follow Up Recommendations Home health PT;Supervision/Assistance - 24 hour    Equipment Recommendations  None recommended by PT    Recommendations for Other Services       Precautions / Restrictions Precautions Precautions: Knee Precaution Booklet Issued: Yes (comment) Precaution Comments: Reviewed precautions with patient Restrictions Weight Bearing Restrictions: Yes LLE Weight Bearing: Weight bearing as tolerated      Mobility  Bed Mobility Overal bed mobility: Modified Independent             General bed mobility comments: Use of bed rail to move to sitting.  Transfers Overall transfer level: Needs assistance Equipment used: Rolling walker (2 wheeled) Transfers: Sit to/from Stand Sit to Stand: Supervision         General transfer comment: Verbal cues for hand placement and technique.  Supervision for safety with standing from bed and toilet.  Ambulation/Gait Ambulation/Gait assistance: Supervision Ambulation Distance (Feet): 82 Feet Assistive device: Rolling walker (2 wheeled) Gait Pattern/deviations: Step-through pattern;Decreased stance time - left;Decreased step length - right;Decreased stride length;Decreased weight shift to left Gait velocity: Decreased Gait velocity interpretation: Below normal speed for  age/gender General Gait Details: Verbal cues for safe use of RW.  Cues for gait sequence and to put Lt foot flat on floor for more normal gait pattern.  Patient walking on ball of Lt foot.  Good balance with RW with gair.  Stairs            Wheelchair Mobility    Modified Rankin (Stroke Patients Only)       Balance                                             Pertinent Vitals/Pain Pain Assessment: 0-10 Pain Score: 2  Pain Location: Lt knee Pain Descriptors / Indicators: Sore Pain Intervention(s): Monitored during session;Ice applied    Home Living Family/patient expects to be discharged to:: Private residence Living Arrangements: Spouse/significant other Available Help at Discharge: Family;Friend(s);Available 24 hours/day Type of Home: House Home Access: Ramped entrance     Home Layout: One level Home Equipment: Walker - 2 wheels;Walker - 4 wheels;Shower seat;Cane - single point      Prior Function Level of Independence: Independent with assistive device(s)         Comments: Uses cane for ambulation     Hand Dominance        Extremity/Trunk Assessment   Upper Extremity Assessment: Overall WFL for tasks assessed           Lower Extremity Assessment: LLE deficits/detail   LLE Deficits / Details: Decreased strength and ROM post-op.    Cervical / Trunk Assessment: Normal  Communication   Communication: No difficulties  Cognition Arousal/Alertness: Awake/alert Behavior During Therapy: WFL for tasks assessed/performed  Overall Cognitive Status: Within Functional Limits for tasks assessed                      General Comments      Exercises Total Joint Exercises Ankle Circles/Pumps: AROM;Both;10 reps;Seated Quad Sets: AROM;Left;10 reps;Seated Heel Slides: AROM;Left;5 reps;Seated      Assessment/Plan    PT Assessment Patient needs continued PT services  PT Diagnosis Difficulty walking;Abnormality of gait;Acute  pain   PT Problem List Decreased strength;Decreased range of motion;Decreased balance;Decreased mobility;Decreased knowledge of use of DME;Decreased knowledge of precautions;Pain  PT Treatment Interventions DME instruction;Gait training;Functional mobility training;Therapeutic activities;Therapeutic exercise;Patient/family education   PT Goals (Current goals can be found in the Care Plan section) Acute Rehab PT Goals Patient Stated Goal: To return  home PT Goal Formulation: With patient/family Time For Goal Achievement: 12/16/13 Potential to Achieve Goals: Good    Frequency 7X/week   Barriers to discharge        Co-evaluation               End of Session Equipment Utilized During Treatment: Gait belt Activity Tolerance: Patient tolerated treatment well Patient left: in chair;with call bell/phone within reach;with family/visitor present Nurse Communication: Mobility status         Time: [id number] PT Time Calculation (min): 31 min   Charges:   PT Evaluation $Initial PT Evaluation Tier I: 1 Procedure PT Treatments $Gait Training: 23-37 mins   PT G CodesVena [place]:          Tiya Schrupp H 12/09/2013, 6:13 PM Durenda HurtSusan H. Renaldo Fiddleravis, PT, [facility] Acute Rehab Services Pager [phone]

## 2013-12-10 ENCOUNTER — Encounter (HOSPITAL_COMMUNITY): Payer: Self-pay | Admitting: General Practice

## 2013-12-10 LAB — BASIC METABOLIC PANEL
Anion gap: 10 (ref 5–15)
BUN: 6 mg/dL (ref 6–23)
CALCIUM: 9.2 mg/dL (ref 8.4–10.5)
CO2: 31 meq/L (ref 19–32)
Chloride: 100 mEq/L (ref 96–112)
Creatinine, Ser: 0.63 mg/dL (ref 0.50–1.10)
GFR calc Af Amer: >90 mL/min (ref >90)
GFR calc non Af Amer: >90 mL/min (ref >90)
Glucose, Bld: 97 mg/dL (ref 70–99)
Potassium: 4.2 mEq/L (ref 3.7–5.3)
Sodium: 141 mEq/L (ref 137–147)

## 2013-12-10 LAB — CBC
HCT: 36.7 % (ref 36.0–46.0)
HEMOGLOBIN: 12.1 g/dL (ref 12.0–15.0)
MCH: 29.2 pg (ref 26.0–34.0)
MCHC: 33 g/dL (ref 30.0–36.0)
MCV: 88.4 fL (ref 78.0–100.0)
PLATELETS: 276 10*3/uL (ref 150–400)
RBC: 4.15 MIL/uL (ref 3.87–5.11)
RDW: 12.9 % (ref 11.5–15.5)
WBC: 10.2 10*3/uL (ref 4.0–10.5)

## 2013-12-10 MED ORDER — MORPHINE SULFATE 15 MG PO TABS
15.0000 mg | ORAL_TABLET | ORAL | Status: DC | PRN
Start: 2013-12-10 — End: 2013-12-10
  Administered 2013-12-10 (×2): 15 mg via ORAL
  Filled 2013-12-10 (×2): qty 1

## 2013-12-10 MED ORDER — METHOCARBAMOL 500 MG PO TABS: 500.0000 mg | ORAL_TABLET | Freq: Four times a day (QID) | ORAL | Status: DC

## 2013-12-10 MED ORDER — MORPHINE SULFATE 15 MG PO TABS: 15.0000 mg | ORAL_TABLET | ORAL | Status: DC | PRN

## 2013-12-10 MED ORDER — ASPIRIN EC 325 MG PO TBEC: 325.0000 mg | DELAYED_RELEASE_TABLET | Freq: Every day | ORAL | Status: DC

## 2013-12-10 MED ORDER — HYDROCODONE-ACETAMINOPHEN 10-325 MG PO TABS: 1.0000 | ORAL_TABLET | ORAL | Status: DC | PRN

## 2013-12-10 NOTE — Progress Notes (Signed)
SPORTS MEDICINE AND JOINT REPLACEMENT  Georgena SpurlingStephen Lucey, MD   Altamese CabalMaurice Samariya Rockhold, PA-C [address], [place], [place]  [id number]                             [phone]   PROGRESS NOTE  Subjective:  negative for Chest Pain  negative for Shortness of Breath  negative for Nausea/Vomiting   negative for Calf Pain  negative for Bowel Movement   Tolerating Diet: yes         Patient reports pain as 4 on 0-10 scale.    Objective: Vital signs in last 24 hours:   Patient Vitals for the past 24 hrs:  BP Temp Pulse Resp SpO2  12/10/13 1635 132/78 mmHg 98 F (36.7 C) (!) 55 18 95 %  12/10/13 1600 - - - 18 -  12/10/13 1200 - - - 16 -  12/10/13 0800 - - - 16 -  12/10/13 0600 113/64 mmHg 97.8 F (36.6 C) (!) 51 16 99 %  12/10/13 0400 - - - 16 100 %  12/10/13 0000 - - - 16 100 %  12/09/13 2007 112/62 mmHg 97.7 F (36.5 C) (!) 50 16 100 %  12/09/13 2000 - - - 16 100 %    @flow {1959:LAST@   Intake/Output from previous day:   11/02 0701 - 11/03 0700 In: 2640 [P.O.:1320; I.V.:1200] Out: 1300 [Urine:1150]   Intake/Output this shift:   11/03 0701 - 11/03 1900 In: 600 [P.O.:600] Out: 125 [Drains:125]   Intake/Output      11/02 0701 - 11/03 0700 11/03 0701 - 11/04 0700   P.O. 1320 600   I.V. (mL/kg) 1200 (16.5)    Other 120    Total Intake(mL/kg) 2640 (36.4) 600 (8.3)   Urine (mL/kg/hr) 1150    Drains  125 (0.2)   Blood 150    Total Output 1300 125   Net +1340 +475        Urine Occurrence 4 x 2 x      LABORATORY DATA:  Recent Labs  12/07/13 0840 12/07/13 1800 12/09/13 2037 12/10/13 0631  WBC 9.6 9.9 11.1* 10.2  HGB 13.8 14.7 12.9 12.1  HCT 42.0 43.9 38.9 36.7  PLT 318 302 300 276    Recent Labs  12/07/13 0840 12/07/13 1800 12/09/13 2037 12/10/13 0631  NA 145 146  --  141  K 4.0 3.9  --  4.2  CL 105 108  --  100  CO2 28 21  --  31  BUN 9 14  --  6  CREATININE 0.60 0.50 0.48* 0.63  GLUCOSE 88 138*  --  97  CALCIUM 9.5 9.5  --  9.2   Lab Results   Component Value Date   INR 0.97 11/29/2013    Examination:  General appearance: alert, cooperative and no distress Extremities: extremities normal, atraumatic, no cyanosis or edema and Homans sign is negative, no sign of DVT  Wound Exam: clean, dry, intact   Drainage:  Scant/small amount Serosanguinous exudate  Motor Exam: EHL and FHL Intact  Sensory Exam: Deep Peroneal normal   Assessment:    1 Day Post-Op  Procedure(s) (LRB): POLYEXCHANGE WITH SYNOVECTOMY (Left)  ADDITIONAL DIAGNOSIS:  Active Problems:   S/P total knee arthroplasty  Acute Blood Loss Anemia   Plan: Physical Therapy as ordered Weight Bearing as Tolerated (WBAT)  DVT Prophylaxis:  Aspirin  DISCHARGE PLAN: Home  DISCHARGE NEEDS: HHPT, CPM, Walker and 3-in-1  comode seat         Ladonna Vanorder 12/10/2013, 5:35 PM

## 2013-12-10 NOTE — Care Management Note (Signed)
CARE MANAGEMENT NOTE 12/10/2013  Patient:  Melina ModenaLEXANDER,Lillyann   Account Number:  [id number]  Date Initiated:  12/10/2013  Documentation initiated by:  Vance PeperBRADY,Taksh Hjort  Subjective/Objective Assessment:   48 yr old female admitted with arthrofibrosis of left total knee. Patient had arthroscopy with synovectomy and poly exchange.     Action/Plan:   Case manager spoke with patient concerning home health and DME needs. [facility] no changes. patient has family support at discharge. Has RW and 3in1.   Anticipated DC Date:  12/11/2013   Anticipated DC Plan:  HOME W HOME HEALTH SERVICES      DC Planning Services  CM consult      [facility]  HOME HEALTH  DURABLE MEDICAL EQUIPMENT   Choice offered to / List presented to:  C-1 Patient   DME arranged  CPM      DME agency  [employer]     HH arranged  HH-2 PT      [facility]   Status of service:  Completed, signed off Medicare Important Message given?  NA - LOS <3 / Initial given by admissions (If response is "NO", the following Medicare IM given date fields will be blank) Date Medicare IM given:   Medicare IM given by:   Date Additional Medicare IM given:   Additional Medicare IM given by:    Discharge Disposition:  HOME W HOME HEALTH SERVICES  Per UR Regulation:  Reviewed for med. necessity/level of care/duration of stay

## 2013-12-10 NOTE — Progress Notes (Signed)
Physical Therapy Treatment Patient Details Name: Cynthia Cohen MRN: [id number] DOB: [birth year] Today's Date: 12/10/2013    History of Present Illness Patient is a 48 yo female admitted 12/09/13 now s/p Lt TKA.  PMH:  prior Lt TKA, hypothyroidism, HTN, asthma, Tourette syndrome, LLE decreased sensation, DM, depression, anxiety    PT Comments    Patient up walking in room upon entering. Her friend was in there with her and there were no safety concerns. Patient is Mod I will all mobility. Patient is planning to DC home later today. Encouraged to ambulate as tolerated. Patient safe to D/C from a mobility standpoint based on progression towards goals set on PT eval.    Follow Up Recommendations  Home health PT;Supervision/Assistance - 24 hour     Equipment Recommendations  None recommended by PT    Recommendations for Other Services       Precautions / Restrictions Precautions Precautions: Knee Precaution Comments: Reviewed precautions with patient Restrictions LLE Weight Bearing: Weight bearing as tolerated    Mobility  Bed Mobility                  Transfers Overall transfer level: Modified independent                  Ambulation/Gait Ambulation/Gait assistance: Modified independent (Device/Increase time) Ambulation Distance (Feet): 600 Feet Assistive device: Rolling walker (2 wheeled) Gait Pattern/deviations: Step-through pattern;Decreased stride length     General Gait Details: Patient with safe use of RW. No cues needed   Stairs            Wheelchair Mobility    Modified Rankin (Stroke Patients Only)       Balance                                    Cognition Arousal/Alertness: Awake/alert Behavior During Therapy: WFL for tasks assessed/performed Overall Cognitive Status: Within Functional Limits for tasks assessed                      Exercises Total Joint Exercises Quad Sets: AROM;Left;10  reps;Seated Heel Slides: AROM;Left;Seated;10 reps Straight Leg Raises: AROM;Left;10 reps Long Arc Quad: AROM;Left;10 reps Goniometric ROM: ~ 90degress knee flexion in sitting    General Comments        Pertinent Vitals/Pain Pain Score: 2  Pain Location: Lt knee Pain Descriptors / Indicators: Sore Pain Intervention(s): Monitored during session    Home Living                      Prior Function            PT Goals (current goals can now be found in the care plan section) Progress towards PT goals: Progressing toward goals    Frequency  7X/week    PT Plan Current plan remains appropriate    Co-evaluation             End of Session Equipment Utilized During Treatment: Gait belt Activity Tolerance: Patient tolerated treatment well Patient left: in chair;with call bell/phone within reach     Time: 0750-0814 PT Time Calculation (min): 24 min  Charges:  $Gait Training: 8-22 mins $Therapeutic Exercise: 8-22 mins                    G Codes:      Fredrich BirksRobinette, Julia Elizabeth 12/10/2013, 8:53 AM 12/10/2013 Mckinley Jewelobinette, Julia  [place] pager [phone] office

## 2013-12-10 NOTE — Plan of Care (Signed)
Problem: Consults Goal: Total Joint Replacement Patient Education See Patient Education Module for education specifics.  Outcome: Progressing Goal: Skin Care Protocol Initiated - if Braden Score 18 or less If consults are not indicated, leave blank or document N/A  Outcome: Progressing Goal: Nutrition Consult-if indicated Outcome: Completed/Met Date Met:  12/10/13  Problem: Phase I Progression Outcomes Goal: Pain controlled with appropriate interventions Outcome: Progressing  Problem: Phase II Progression Outcomes Goal: Tolerating diet Outcome: Completed/Met Date Met:  12/10/13  Problem: Phase III Progression Outcomes Goal: Pain controlled on oral analgesia Outcome: Completed/Met Date Met:  12/10/13 Goal: Ambulates Outcome: Completed/Met Date Met:  12/10/13 Goal: Anticoagulant follow-up in place Outcome: Completed/Met Date Met:  12/10/13

## 2013-12-10 NOTE — Op Note (Signed)
NAMMarland Kitchen:  Cynthia Cohen, Cynthia Cohen             ACCOUNT NO.:  [id number]  MEDICAL RECORD NO.:  [id number]  LOCATION:  [id number]                        FACILITY:  [facility]  PHYSICIAN:  Mila HomerStephen D. Sherlean FootLucey, M.D. DATE OF BIRTH:  January 13, 1966  DATE OF PROCEDURE:  12/09/2013 DATE OF DISCHARGE:                              OPERATIVE REPORT   SURGEON:  Mila HomerStephen D. Sherlean FootLucey, M.D.  ASSISTANT:  Altamese CabalMaurice Jones, PA-C.  ANESTHESIA:  General.  PREOPERATIVE DIAGNOSIS:  Left knee painful arthrofibrosis.  POSTOPERATIVE DIAGNOSIS:  Left knee painful arthrofibrosis.  PROCEDURE:  Left knee arthroscopy with open synovectomy and polyethylene exchange (revision of the tibial component).  INDICATIONS FOR PROCEDURE:  The patient is a 48 year old, over a year out from a left total knee replacement with very painful arthrofibrosis. Informed consent was obtained.  DESCRIPTION OF PROCEDURE:  The patient was laid supine, administered spinal anesthesia.  After a midline incision through the old incision with a #10 blade, a new blade was used to make a median parapatellar arthrotomy and perform aggressive synovectomy removing the entirety of the synovium, it was very meticulous in this.  I then removed the polyethylene.  I got to the back of the knee, removed some scar tissue between the notch and the anterior fat pad.  Once adequate resection of the synovium was complete, I then snubbed in a new polyethylene of the same size.  I then copiously irrigated.  I infiltrated with Exparel and Marcaine.  I then let the tourniquet down, obtained hemostasis, put a drain coming out superolaterally and deep the arthrotomy.  I closed the arthrotomy with figure-of-eight #1-Vicryl sutures, buried 0-Vicryl sutures, subcuticular 2-0 Vicryl sutures, and skin staples.  Dressed with Xeroform, dressing sponges, sterile Webril, and then TED stockings.  COMPLICATIONS:  None.  DRAINS:  One Hemovac.  EBL:  200 mL.  TOURNIQUET TIME:  28 minutes.           ______________________________ Mila HomerStephen D. Sherlean FootLucey, M.D.     SDL/MEDQ  D:  12/09/2013  T:  12/10/2013  Job:  [id number]

## 2013-12-10 NOTE — Progress Notes (Signed)
Discharge instructions and prescriptions given to and reviewed with patient. Patient denies questions or concerns at this time. VS stable. [facility] and already has all needed equipment at home. Patient  discharged via wheelchair with all personal belongings, discharge packet, and prescriptions.

## 2013-12-10 NOTE — Addendum Note (Signed)
Addendum  created 12/10/13 2232 by Arta BruceKevin Tyiana Hill, MD   Modules edited: Anesthesia Blocks and Procedures, Clinical Notes   Clinical Notes:  File: [id number]; File: [id number]; File: [id number]; File: [id number]; File: [id number]

## 2013-12-10 NOTE — Progress Notes (Signed)
Utilization review completed.  

## 2013-12-10 NOTE — Discharge Instructions (Signed)
Diet: As you were doing prior to hospitalization  ° °Activity:  Increase activity slowly as tolerated  °                No lifting or driving for 6 weeks ° °Shower:  May shower without a dressing once there is no drainage from your wound. °                Do NOT wash over the wound. °                °Dressing:  You may change your dressing on Wednesday °                   Then change the dressing daily with sterile 4"x4"s gauze dressing  °                   And TED hose for knees. ° °Weight Bearing:  Weight bearing as tolerated as taught in physical therapy.  Use a                                walker or Crutches as instructed. ° °To prevent constipation: you may use a stool softener such as - °              Colace ( over the counter) 100 mg by mouth twice a day  °              Drink plenty of fluids ( prune juice may be helpful) and high fiber foods °               Miralax ( over the counter) for constipation as needed.   ° °Precautions:  If you experience chest pain or shortness of breath - call 911 immediately               For transfer to the hospital emergency department!! °              If you develop a fever greater that 101 F, purulent drainage from wound,                             increased redness or drainage from wound, or calf pain -- Call the office. ° °Follow- Up Appointment:  Please call for an appointment to be seen on 12/24/13 °                                             [place] office:  [phone] °           [address] , [place] [address] °               ° ° °

## 2013-12-10 NOTE — Evaluation (Signed)
Occupational Therapy Evaluation Patient Details Name: Cynthia Cohen MRN: [id number] DOB: 1965/02/25 Today's Date: 12/10/2013    History of Present Illness Patient is a 48 yo female admitted 12/09/13 now s/p Lt TKA.  PMH:  prior Lt TKA, hypothyroidism, HTN, asthma, Tourette syndrome, LLE decreased sensation, DM, depression, anxiety   Clinical Impression   Pt admitted with the above diagnoses and presents with below problem list. PTA pt was mod I for ADLs with use of cane. Currently pt is at supervision to min guard level for LB ADLs. ADL education provided as detailed below. No further OT needs indicated at this time.     Follow Up Recommendations  Supervision - Intermittent;No OT follow up    Equipment Recommendations  None recommended by OT;Other (comment) (discussed toilet/shower transfers pt does not want 3n1)    Recommendations for Other Services       Precautions / Restrictions Precautions Precautions: Knee Precaution Comments: Reviewed precautions with patient Restrictions Weight Bearing Restrictions: Yes LLE Weight Bearing: Weight bearing as tolerated      Mobility Bed Mobility               General bed mobility comments: pt in recliner  Transfers Overall transfer level: Modified independent Equipment used: Rolling walker (2 wheeled) Transfers: Sit to/from Stand Sit to Stand: Supervision         General transfer comment: cues to keep rw in front during pivoting to seated surface    Balance Overall balance assessment: Needs assistance         Standing balance support: Bilateral upper extremity supported;During functional activity Standing balance-Leahy Scale: Fair                              ADL Overall ADL's : Needs assistance/impaired Eating/Feeding: Set up;Sitting   Grooming: Set up;Sitting;Standing   Upper Body Bathing: Sitting;Supervision/ safety   Lower Body Bathing: Min guard;Sit to/from stand   Upper Body Dressing :  Set up;Sitting   Lower Body Dressing: Min guard;Sit to/from stand   Toilet Transfer: Supervision/safety;Ambulation;Comfort height toilet;Grab bars;RW   Toileting- Clothing Manipulation and Hygiene: Supervision/safety;Sit to/from stand   Tub/ Shower Transfer: Supervision/safety;Ambulation;Shower seat;Grab bars;Rolling walker   Functional mobility during ADLs: Supervision/safety;Rolling walker General ADL Comments: Educated pt on techniques and AE for safe completion of ADLs. Discussed home setup particularly related to setup and safety with toilet and shower transfers.     Vision                     Perception     Praxis      Pertinent Vitals/Pain Pain Assessment: 0-10 Pain Score: 2  Pain Location: Lt knee Pain Descriptors / Indicators: Sore Pain Intervention(s): Monitored during session     Hand Dominance     Extremity/Trunk Assessment Upper Extremity Assessment Upper Extremity Assessment: Overall WFL for tasks assessed   Lower Extremity Assessment Lower Extremity Assessment: Defer to PT evaluation       Communication Communication Communication: No difficulties   Cognition Arousal/Alertness: Awake/alert Behavior During Therapy: WFL for tasks assessed/performed Overall Cognitive Status: Within Functional Limits for tasks assessed                     General Comments       Exercises       Shoulder Instructions      Home Living Family/patient expects to be discharged to:: Private residence Living Arrangements: Spouse/significant other  Available Help at Discharge: Family;Friend(s);Available 24 hours/day Type of Home: House Home Access: Ramped entrance     Home Layout: One level     Bathroom Shower/Tub: Producer, television/film/videoWalk-in shower   Bathroom Toilet: Standard Bathroom Accessibility: Yes How Accessible: Accessible via walker Home Equipment: Walker - 2 wheels;Walker - 4 wheels;Shower seat;Cane - single point          Prior  Functioning/Environment Level of Independence: Independent with assistive device(s)        Comments: Uses cane for ambulation    OT Diagnosis: Acute pain   OT Problem List: Impaired balance (sitting and/or standing);Decreased knowledge of use of DME or AE;Decreased knowledge of precautions;Pain   OT Treatment/Interventions:      OT Goals(Current goals can be found in the care plan section) Acute Rehab OT Goals Patient Stated Goal: To return  home  OT Frequency:     Barriers to D/C:            Co-evaluation              End of Session Equipment Utilized During Treatment: Rolling walker CPM Left Knee Additional Comments: zero knee on at 9:00  Activity Tolerance: Patient tolerated treatment well Patient left: in chair;with call bell/phone within reach   Time: 0850-0903 OT Time Calculation (min): 13 min Charges:  OT General Charges $OT Visit: 1 Procedure OT Evaluation $Initial OT Evaluation Tier I: 1 Procedure OT Treatments $Self Care/Home Management : 8-22 mins G-Codes:    Pilar GrammesMathews, Beckam Abdulaziz H 12/10/2013, 9:20 AM

## 2013-12-11 NOTE — Discharge Summary (Signed)
SPORTS MEDICINE & JOINT REPLACEMENT   Georgena SpurlingStephen Lucey, MD   Altamese CabalMaurice Tambi Thole, PA-C [address], [place], [place]  [address]                             [phone]  PATIENT ID: Cynthia Cohen        MRN:  [id number]          DOB/AGE: 1965-06-16 / 48 y.o.    DISCHARGE SUMMARY  ADMISSION DATE:    12/09/2013 DISCHARGE DATE:  12/10/2013  ADMISSION DIAGNOSIS: arthrofibrosis left total knee    DISCHARGE DIAGNOSIS:  arthrofibrosis left total knee    ADDITIONAL DIAGNOSIS: Active Problems:   S/P total knee arthroplasty  Past Medical History  Diagnosis Date  . Thyroid disease   . Hypertension   . Asthma     rare exposure to smoke or other irritatnt   . Hypothyroidism   . Depression   . Anxiety   . GERD (gastroesophageal reflux disease)   . Arthritis     L knee   . Neuromuscular disorder     tourette syndrome, followed by neurology,neuropathy   . Hyperinsulinemia   . Diabetes mellitus without complication     in combination with hyperinsulinnemia     PROCEDURE: Procedure(s): POLYEXCHANGE WITH SYNOVECTOMY on 12/09/2013  CONSULTS:     HISTORY:  See H&P in chart  HOSPITAL COURSE:  Cynthia Modenanita Goodreau is a 48 y.o. admitted on 12/09/2013 and found to have a diagnosis of arthrofibrosis left total knee.  After appropriate laboratory studies were obtained  they were taken to the operating room on 12/09/2013 and underwent Procedure(s): POLYEXCHANGE WITH SYNOVECTOMY.   They were given perioperative antibiotics:  Anti-infectives    Start     Dose/Rate Route Frequency Ordered Stop   12/09/13 1415  vancomycin (VANCOCIN) IVPB 1000 mg/200 mL premix     1,000 mg200 mL/hr over 60 Minutes Intravenous Every 12 hours 12/09/13 1405 12/09/13 1733   12/09/13 0600  ceFAZolin (ANCEF) IVPB 2 g/50 mL premix     2 g100 mL/hr over 30 Minutes Intravenous On call to O.R. 12/08/13 1332 12/09/13 1025    .  Tolerated the procedure well.  Placed with a foley intraoperatively.  Given Ofirmev at  induction and for 48 hours.    POD# 1: Vital signs were stable.  Patient denied Chest pain, shortness of breath, or calf pain.  Patient was started on Lovenox 30 mg subcutaneously twice daily at 8am.  Consults to PT, OT, and care management were made.  The patient was weight bearing as tolerated.  CPM was placed on the operative leg 0-90 degrees for 6-8 hours a day.  Incentive spirometry was taught.  Dressing was changed.  Hemovac was discontinued.      POD #2, Continued  PT for ambulation and exercise program.  IV saline locked.  O2 discontinued.    The remainder of the hospital course was dedicated to ambulation and strengthening.   The patient was discharged on 1 day post op in  Good condition.  Blood products given:none  DIAGNOSTIC STUDIES: Recent vital signs: No data found.      Recent laboratory studies:  Recent Labs  12/07/13 0840 12/07/13 1800 12/09/13 2037 12/10/13 0631  WBC 9.6 9.9 11.1* 10.2  HGB 13.8 14.7 12.9 12.1  HCT 42.0 43.9 38.9 36.7  PLT 318 302 300 276    Recent Labs  12/07/13 0840 12/07/13 1800 12/09/13 2037 12/10/13 0631  NA 145 146  --  141  K 4.0 3.9  --  4.2  CL 105 108  --  100  CO2 28 21  --  31  BUN 9 14  --  6  CREATININE 0.60 0.50 0.48* 0.63  GLUCOSE 88 138*  --  97  CALCIUM 9.5 9.5  --  9.2   Lab Results  Component Value Date   INR 0.97 11/29/2013     Recent Radiographic Studies :  Ct Soft Tissue Neck W Contrast  12/07/2013   CLINICAL DATA:  Dysphagia.  EXAM: CT NECK WITH CONTRAST  TECHNIQUE: Multidetector CT imaging of the neck was performed using the standard protocol following the bolus administration of intravenous contrast.  CONTRAST:  75mL OMNIPAQUE IOHEXOL 300 MG/ML  SOLN  COMPARISON:  None.  FINDINGS: Base of brain is unremarkable. Visualized orbits unremarkable. The visualized paranasal sinuses are clear. Mastoids are clear.  Nasopharynx, oropharynx, larynx, trachea appear normal. Thyroid unremarkable. Tongue and tongue  base appear normal. Parapharyngeal space is normal. Parotid and submandibular glands are normal. Shotty submandibular, submental, and anterior cervical lymph nodes. Cervical vascular structures are unremarkable. Retropharyngeal space is normal. No acute bony abnormality. Pulmonary apices are clear. A cutaneous/subcutaneous 1.5 cm soft tissue density is noted along the right lower posterior neck/ shoulder. Clinical correlation is suggested.  IMPRESSION: 1. 1.5 cm right posterior neck/shoulder subcutaneous/cutaneous lesion. Clinical evaluation suggested.  2.  Shotty submental, submandibular, and cervical lymph nodes.   Electronically Signed   By: Maisie Fushomas  Register   On: 12/07/2013 19:19    DISCHARGE INSTRUCTIONS: Discharge Instructions    CPM    Complete by:  As directed   Continuous passive motion machine (CPM):      Use the CPM from 0 to 90 for 6-8 hours per day.      You may increase by 10 per day.  You may break it up into 2 or 3 sessions per day.      Use CPM for 2 weeks or until you are told to stop.     Call MD / Call 911    Complete by:  As directed   If you experience chest pain or shortness of breath, CALL 911 and be transported to the hospital emergency room.  If you develope a fever above 101 F, pus (white drainage) or increased drainage or redness at the wound, or calf pain, call your surgeon's office.     Change dressing    Complete by:  As directed   Change dressing on wednesday, then change the dressing daily with sterile 4 x 4 inch gauze dressing and apply TED hose.     Constipation Prevention    Complete by:  As directed   Drink plenty of fluids.  Prune juice may be helpful.  You may use a stool softener, such as Colace (over the counter) 100 mg twice a day.  Use MiraLax (over the counter) for constipation as needed.     Diet - low sodium heart healthy    Complete by:  As directed      Do not put a pillow under the knee. Place it under the heel.    Complete by:  As directed       Driving restrictions    Complete by:  As directed   No driving for 6 weeks     Increase activity slowly as tolerated    Complete by:  As directed      Lifting restrictions  Complete by:  As directed   No lifting for 6 weeks     TED hose    Complete by:  As directed   Use stockings (TED hose) for 3 weeks on both leg(s).  You may remove them at night for sleeping.           DISCHARGE MEDICATIONS:     Medication List    STOP taking these medications        HYDROcodone-acetaminophen 5-325 MG per tablet  Commonly known as:  NORCO/VICODIN  Replaced by:  HYDROcodone-acetaminophen 10-325 MG per tablet     SOMA 350 MG tablet  Generic drug:  carisoprodol      TAKE these medications        albuterol 108 (90 BASE) MCG/ACT inhaler  Commonly known as:  PROVENTIL HFA;VENTOLIN HFA  Inhale 1 puff into the lungs every 6 (six) hours as needed for wheezing.     aspirin EC 325 MG tablet  Take 1 tablet (325 mg total) by mouth daily.     calcium carbonate 600 MG Tabs tablet  Commonly known as:  OS-CAL  Take 600 mg by mouth daily with breakfast.     clonazePAM 1 MG tablet  Commonly known as:  KLONOPIN  Take 0.5 mg by mouth every 8 (eight) hours.     DULoxetine 60 MG capsule  Commonly known as:  CYMBALTA  Take 60 mg by mouth daily before breakfast.     fluticasone 50 MCG/ACT nasal spray  Commonly known as:  FLONASE  Place 2 sprays into the nose daily.     gabapentin 300 MG capsule  Commonly known as:  NEURONTIN  Take 300 mg by mouth 3 (three) times daily.     haloperidol 0.5 MG tablet  Commonly known as:  HALDOL  Take 0.5 mg by mouth every 8 (eight) hours as needed for agitation.     HYDROcodone-acetaminophen 10-325 MG per tablet  Commonly known as:  NORCO  Take 1-2 tablets by mouth every 4 (four) hours as needed (breakthrough pain).     lamoTRIgine 200 MG tablet  Commonly known as:  LAMICTAL  Take 400 mg by mouth at bedtime.     levothyroxine 50 MCG tablet   Commonly known as:  SYNTHROID, LEVOTHROID  Take 50 mcg by mouth daily before breakfast.     lisinopril 40 MG tablet  Commonly known as:  PRINIVIL,ZESTRIL  Take 40 mg by mouth daily.     meclizine 25 MG tablet  Commonly known as:  ANTIVERT  Take 25 mg by mouth 2 (two) times daily as needed for dizziness or nausea.     metFORMIN 500 MG 24 hr tablet  Commonly known as:  GLUCOPHAGE-XR  Take 500 mg by mouth every evening.     methocarbamol 500 MG tablet  Commonly known as:  ROBAXIN  Take 1-2 tablets (500-1,000 mg total) by mouth 4 (four) times daily.     montelukast 10 MG tablet  Commonly known as:  SINGULAIR  Take 10 mg by mouth daily before breakfast.     morphine 15 MG tablet  Commonly known as:  MSIR  Take 1 tablet (15 mg total) by mouth every 4 (four) hours as needed for severe pain.     multivitamin with minerals Tabs tablet  Take 1 tablet by mouth daily.     nitrofurantoin (macrocrystal-monohydrate) 100 MG capsule  Commonly known as:  MACROBID  Take 1 capsule (100 mg total) by mouth 2 (two) times daily. X  7 days     omeprazole 20 MG capsule  Commonly known as:  PRILOSEC  Take 20 mg by mouth 2 (two) times daily before a meal.     Oxcarbazepine 300 MG tablet  Commonly known as:  TRILEPTAL  Take 150 mg by mouth 2 (two) times daily.     predniSONE 10 MG tablet  Commonly known as:  STERAPRED UNI-PAK  Take by mouth daily. As directed on pack - 6 day taper pack     traZODone 150 MG tablet  Commonly known as:  DESYREL  Take 75 mg by mouth at bedtime.     verapamil 120 MG CR tablet  Commonly known as:  CALAN-SR  Take 120 mg by mouth at bedtime.     Vitamin D 2000 UNITS Caps  Take 1 capsule by mouth 2 (two) times daily.        FOLLOW UP VISIT:       Follow-up Information    Follow up with [facility]   Why:  Someone from [facility] will contact you concerning start date and time for physical therapy.   Contact information:   [address] [place] [address] [phone]       Follow up with Raymon Mutton, MD. Call on 12/24/2013.   Specialty:  Orthopedic Surgery   Contact information:   [address] [place] [address] [phone]       DISPOSITION: HOME  CONDITION:  Good   Chinelo Benn 12/11/2013, 9:35 PM

## 2013-12-12 ENCOUNTER — Encounter (HOSPITAL_COMMUNITY): Payer: Self-pay | Admitting: Orthopedic Surgery

## 2013-12-18 ENCOUNTER — Ambulatory Visit: Payer: Medicare Other | Attending: Orthopedic Surgery | Admitting: Physical Therapy

## 2013-12-18 DIAGNOSIS — M25662 Stiffness of left knee, not elsewhere classified: Secondary | ICD-10-CM | POA: Insufficient documentation

## 2013-12-18 DIAGNOSIS — Z96652 Presence of left artificial knee joint: Secondary | ICD-10-CM | POA: Insufficient documentation

## 2013-12-18 DIAGNOSIS — M6281 Muscle weakness (generalized): Secondary | ICD-10-CM | POA: Diagnosis not present

## 2013-12-18 DIAGNOSIS — R2689 Other abnormalities of gait and mobility: Secondary | ICD-10-CM | POA: Insufficient documentation

## 2013-12-18 DIAGNOSIS — M25562 Pain in left knee: Secondary | ICD-10-CM | POA: Insufficient documentation

## 2013-12-24 ENCOUNTER — Ambulatory Visit: Payer: Medicare Other | Admitting: Rehabilitation

## 2013-12-25 ENCOUNTER — Ambulatory Visit: Payer: Medicare Other | Admitting: Physical Therapy

## 2013-12-27 ENCOUNTER — Ambulatory Visit: Payer: Medicare Other | Admitting: Physical Therapy

## 2013-12-27 DIAGNOSIS — M25562 Pain in left knee: Secondary | ICD-10-CM | POA: Diagnosis not present

## 2013-12-30 ENCOUNTER — Ambulatory Visit: Payer: Medicare Other | Admitting: Physical Therapy

## 2013-12-31 ENCOUNTER — Ambulatory Visit: Payer: Medicare Other | Admitting: Physical Therapy

## 2014-01-01 ENCOUNTER — Ambulatory Visit: Payer: Medicare Other | Admitting: Physical Therapy

## 2014-01-01 DIAGNOSIS — M25562 Pain in left knee: Secondary | ICD-10-CM | POA: Diagnosis not present

## 2014-01-06 ENCOUNTER — Ambulatory Visit: Payer: Medicare Other | Admitting: Rehabilitation

## 2014-01-06 DIAGNOSIS — M25562 Pain in left knee: Secondary | ICD-10-CM | POA: Diagnosis not present

## 2014-01-09 ENCOUNTER — Ambulatory Visit: Payer: Medicare Other | Attending: Orthopedic Surgery | Admitting: Physical Therapy

## 2014-01-09 DIAGNOSIS — R2689 Other abnormalities of gait and mobility: Secondary | ICD-10-CM | POA: Diagnosis not present

## 2014-01-09 DIAGNOSIS — M6281 Muscle weakness (generalized): Secondary | ICD-10-CM | POA: Diagnosis not present

## 2014-01-09 DIAGNOSIS — Z96652 Presence of left artificial knee joint: Secondary | ICD-10-CM | POA: Diagnosis not present

## 2014-01-09 DIAGNOSIS — M25562 Pain in left knee: Secondary | ICD-10-CM | POA: Diagnosis present

## 2014-01-09 DIAGNOSIS — M25662 Stiffness of left knee, not elsewhere classified: Secondary | ICD-10-CM | POA: Diagnosis not present

## 2014-01-13 ENCOUNTER — Ambulatory Visit: Payer: Medicare Other | Admitting: Rehabilitation

## 2014-01-14 ENCOUNTER — Ambulatory Visit: Payer: Medicare Other | Admitting: Physical Therapy

## 2014-01-14 DIAGNOSIS — M25562 Pain in left knee: Secondary | ICD-10-CM | POA: Diagnosis not present

## 2014-01-16 ENCOUNTER — Ambulatory Visit: Payer: Medicare Other | Admitting: Physical Therapy

## 2014-01-20 ENCOUNTER — Ambulatory Visit: Payer: Medicare Other | Admitting: Rehabilitation

## 2014-01-23 ENCOUNTER — Ambulatory Visit: Payer: Medicare Other | Admitting: Physical Therapy

## 2017-07-30 ENCOUNTER — Emergency Department (HOSPITAL_COMMUNITY)
Admission: EM | Admit: 2017-07-30 | Discharge: 2017-07-30 | Disposition: A | Discharge status: Home / Self Care | Payer: Managed Care, Other (non HMO) | Attending: Emergency Medicine | Admitting: Emergency Medicine

## 2017-07-30 ENCOUNTER — Other Ambulatory Visit: Payer: Self-pay

## 2017-07-30 ENCOUNTER — Encounter (HOSPITAL_COMMUNITY): Payer: Self-pay | Admitting: Emergency Medicine

## 2017-07-30 ENCOUNTER — Emergency Department (HOSPITAL_COMMUNITY): Payer: Managed Care, Other (non HMO)

## 2017-07-30 DIAGNOSIS — M545 Low back pain, unspecified: Secondary | ICD-10-CM

## 2017-07-30 DIAGNOSIS — M21371 Foot drop, right foot: Secondary | ICD-10-CM | POA: Insufficient documentation

## 2017-07-30 DIAGNOSIS — Z7984 Long term (current) use of oral hypoglycemic drugs: Secondary | ICD-10-CM | POA: Diagnosis not present

## 2017-07-30 DIAGNOSIS — M542 Cervicalgia: Secondary | ICD-10-CM | POA: Diagnosis not present

## 2017-07-30 DIAGNOSIS — M546 Pain in thoracic spine: Secondary | ICD-10-CM | POA: Insufficient documentation

## 2017-07-30 DIAGNOSIS — R300 Dysuria: Secondary | ICD-10-CM

## 2017-07-30 DIAGNOSIS — E119 Type 2 diabetes mellitus without complications: Secondary | ICD-10-CM | POA: Diagnosis not present

## 2017-07-30 DIAGNOSIS — J45909 Unspecified asthma, uncomplicated: Secondary | ICD-10-CM | POA: Insufficient documentation

## 2017-07-30 DIAGNOSIS — R296 Repeated falls: Secondary | ICD-10-CM | POA: Insufficient documentation

## 2017-07-30 DIAGNOSIS — I10 Essential (primary) hypertension: Secondary | ICD-10-CM | POA: Insufficient documentation

## 2017-07-30 DIAGNOSIS — E039 Hypothyroidism, unspecified: Secondary | ICD-10-CM | POA: Insufficient documentation

## 2017-07-30 DIAGNOSIS — M21372 Foot drop, left foot: Secondary | ICD-10-CM | POA: Insufficient documentation

## 2017-07-30 DIAGNOSIS — Z79899 Other long term (current) drug therapy: Secondary | ICD-10-CM | POA: Insufficient documentation

## 2017-07-30 DIAGNOSIS — Z7982 Long term (current) use of aspirin: Secondary | ICD-10-CM | POA: Diagnosis not present

## 2017-07-30 DIAGNOSIS — Z96652 Presence of left artificial knee joint: Secondary | ICD-10-CM | POA: Diagnosis not present

## 2017-07-30 HISTORY — DX: Dorsalgia, unspecified: M54.9

## 2017-07-30 LAB — CBC WITH DIFFERENTIAL/PLATELET
Abs Immature Granulocytes: 0 10*3/uL (ref 0.0–0.1)
BASOS ABS: 0.1 10*3/uL (ref 0.0–0.1)
Basophils Relative: 1 %
EOS PCT: 9 %
Eosinophils Absolute: 0.6 10*3/uL (ref 0.0–0.7)
HCT: 42.3 % (ref 36.0–46.0)
Hemoglobin: 13.2 g/dL (ref 12.0–15.0)
Immature Granulocytes: 0 %
LYMPHS ABS: 2.3 10*3/uL (ref 0.7–4.0)
LYMPHS PCT: 34 %
MCH: 28 pg (ref 26.0–34.0)
MCHC: 31.2 g/dL (ref 30.0–36.0)
MCV: 89.8 fL (ref 78.0–100.0)
MONO ABS: 0.5 10*3/uL (ref 0.1–1.0)
Monocytes Relative: 7 %
Neutro Abs: 3.3 10*3/uL (ref 1.7–7.7)
Neutrophils Relative %: 49 %
Platelets: 199 10*3/uL (ref 150–400)
RBC: 4.71 MIL/uL (ref 3.87–5.11)
RDW: 11.9 % (ref 11.5–15.5)
WBC: 6.8 10*3/uL (ref 4.0–10.5)

## 2017-07-30 LAB — URINALYSIS, ROUTINE W REFLEX MICROSCOPIC
Bacteria, UA: NONE SEEN
Bilirubin Urine: NEGATIVE
Glucose, UA: NEGATIVE mg/dL
Ketones, ur: NEGATIVE mg/dL
Nitrite: NEGATIVE
Protein, ur: NEGATIVE mg/dL
Specific Gravity, Urine: 1.006 (ref 1.005–1.030)
pH: 6 (ref 5.0–8.0)

## 2017-07-30 LAB — COMPREHENSIVE METABOLIC PANEL
ALBUMIN: 3.6 g/dL (ref 3.5–5.0)
ALK PHOS: 97 U/L (ref 38–126)
ALT: 18 U/L (ref 14–54)
ANION GAP: 7 (ref 5–15)
AST: 21 U/L (ref 15–41)
BILIRUBIN TOTAL: 0.5 mg/dL (ref 0.3–1.2)
BUN: 8 mg/dL (ref 6–20)
CHLORIDE: 112 mmol/L — AB (ref 101–111)
CO2: 24 mmol/L (ref 22–32)
CREATININE: 0.63 mg/dL (ref 0.44–1.00)
Calcium: 9 mg/dL (ref 8.9–10.3)
GFR calc non Af Amer: >60 mL/min (ref >60)
Glucose, Bld: 73 mg/dL (ref 65–99)
POTASSIUM: 3.5 mmol/L (ref 3.5–5.1)
SODIUM: 143 mmol/L (ref 135–145)
Total Protein: 6.3 g/dL — ABNORMAL LOW (ref 6.5–8.1)

## 2017-07-30 LAB — I-STAT TROPONIN, ED: TROPONIN I, POC: 0 ng/mL (ref 0.00–0.08)

## 2017-07-30 LAB — I-STAT BETA HCG BLOOD, ED (MC, WL, AP ONLY): I-stat hCG, quantitative: <5 m[IU]/mL (ref <5)

## 2017-07-30 MED ORDER — CYCLOBENZAPRINE HCL 5 MG PO TABS: 5.0000 mg | ORAL_TABLET | Freq: Three times a day (TID) | ORAL | 0 refills | Status: DC | PRN

## 2017-07-30 MED ORDER — FENTANYL CITRATE (PF) 100 MCG/2ML IJ SOLN
50.0000 ug | Freq: Once | INTRAMUSCULAR | Status: AC
  Administered 2017-07-30: 50 ug via INTRAVENOUS
  Filled 2017-07-30: qty 2

## 2017-07-30 NOTE — ED Provider Notes (Signed)
[facility] EMERGENCY DEPARTMENT Provider Note   CSN: [id number] Arrival date & time: 07/30/17  1441     History   Chief Complaint Chief Complaint  Patient presents with  . Back Pain    HPI Cynthia Cohen is a 52 y.o. female history of reflux, hypertension, Tourette's syndrome here presenting with back pain, dysuria, foot drop.  Patient has been having foot drop for the last several months.  She states that it is worse on the left but the right has gotten worse over the last week or so.  She states that she feels unsteady occasionally and has occasional falls but denies hitting her head.  She denies any arm numbness or weakness otherwise.  Patient also has some dysuria for the last week or so.  Patient states that she has a bladder stimulator and is concerned that she may have an infection.  She has a follow-up with her neurologist next week.  Does have some pain in the lower neck area as well as diffusely in her back.  Patient states that she cannot get any MRI because of her stimulator.   The history is provided by the patient.    Past Medical History:  Diagnosis Date  . Anxiety   . Arthritis    L knee   . Asthma    rare exposure to smoke or other irritatnt   . Back pain   . Depression   . Diabetes mellitus without complication (HCC)    in combination with hyperinsulinnemia   . GERD (gastroesophageal reflux disease)   . Hyperinsulinemia   . Hypertension   . Hypothyroidism   . Neuromuscular disorder (HCC)    tourette syndrome, followed by neurology,neuropathy   . Thyroid disease     Patient Active Problem List   Diagnosis Date Noted  . 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 ABDOMINAL SURGERY    . APPENDECTOMY    . BLADDER SURGERY     bladder tumor removed- 1997,bladder pacemaker - not active at the present time   . BOWEL RESECTION    . CARDIAC CATHETERIZATION     done for stress related  event, in [place] , [place]- told that every thing was ok, punctured femoral artery- treated /w Plasma   . CERVICAL ABLATION    . CHOLECYSTECTOMY    . EXCISIONAL TOTAL KNEE ARTHROPLASTY Left 12/09/2013   Procedure: POLYEXCHANGE WITH SYNOVECTOMY;  Surgeon: Dannielle Huh, MD;  Location: [facility] OR;  Service: Orthopedics;  Laterality: Left;  Marland Kitchen GASTRIC BYPASS    . HERNIA REPAIR    . ILEOSTOMY REVISION    . REVISION TOTAL KNEE ARTHROPLASTY Left 12/09/2013   dr Sherlean Foot  . TONSILLECTOMY    . TOTAL KNEE ARTHROPLASTY Left 2014   at [facility] History   None      Home Medications    Prior to Admission medications   Medication Sig Start Date End Date Taking? Authorizing Provider  albuterol (PROVENTIL HFA;VENTOLIN HFA) 108 (90 BASE) MCG/ACT inhaler Inhale 1 puff into the lungs every 6 (six) hours as needed for wheezing.     [provider]  aspirin EC 325 MG tablet Take 1 tablet (325 mg total) by mouth daily. 12/10/13   Altamese Cabal, PA-C  calcium carbonate (OS-CAL) 600 MG TABS tablet Take 600 mg by mouth daily with breakfast.    [provider]  Cholecalciferol (VITAMIN D) 2000 UNITS CAPS  Take 1 capsule by mouth 2 (two) times daily.    [provider]  clonazePAM (KLONOPIN) 1 MG tablet Take 0.5 mg by mouth every 8 (eight) hours.     [provider]  DULoxetine (CYMBALTA) 60 MG capsule Take 60 mg by mouth daily before breakfast.     [provider]  fluticasone (FLONASE) 50 MCG/ACT nasal spray Place 2 sprays into the nose daily.  06/06/12   [provider]  gabapentin (NEURONTIN) 300 MG capsule Take 300 mg by mouth 3 (three) times daily.    [provider]  haloperidol (HALDOL) 0.5 MG tablet Take 0.5 mg by mouth every 8 (eight) hours as needed for agitation.  04/27/12   [provider]  HYDROcodone-acetaminophen (NORCO) 10-325 MG per tablet Take 1-2 tablets by mouth every 4 (four) hours as needed (breakthrough pain). 12/10/13   Altamese Cabal, PA-C  lamoTRIgine (LAMICTAL) 200 MG tablet Take 400 mg by mouth at bedtime.    [provider]  levothyroxine (SYNTHROID, LEVOTHROID) 50 MCG tablet Take 50 mcg by mouth daily before breakfast.    [provider]  lisinopril (PRINIVIL,ZESTRIL) 40 MG tablet Take 40 mg by mouth daily.    [provider]  metFORMIN (GLUCOPHAGE-XR) 500 MG 24 hr tablet Take 500 mg by mouth every evening.  07/29/13   [provider]  methocarbamol (ROBAXIN) 500 MG tablet Take 1-2 tablets (500-1,000 mg total) by mouth 4 (four) times daily. 12/10/13   Altamese Cabal, PA-C  montelukast (SINGULAIR) 10 MG tablet Take 10 mg by mouth daily before breakfast.     [provider]  morphine (MSIR) 15 MG tablet Take 1 tablet (15 mg total) by mouth every 4 (four) hours as needed for severe pain. 12/10/13   Altamese Cabal, PA-C  Multiple Vitamin (MULTIVITAMIN WITH MINERALS) TABS tablet Take 1 tablet by mouth daily.    [provider]  nitrofurantoin, macrocrystal-monohydrate, (MACROBID) 100 MG capsule Take 1 capsule (100 mg total) by mouth 2 (two) times daily. X 7 days 12/07/13   Gilda Crease, MD  omeprazole (PRILOSEC) 20 MG capsule Take 20 mg by mouth 2 (two) times daily before a meal.     [provider]  Oxcarbazepine (TRILEPTAL) 300 MG tablet Take 150 mg by mouth 2 (two) times daily.    [provider]  predniSONE (STERAPRED UNI-PAK) 10 MG tablet Take by mouth daily. As directed on pack - 6 day taper pack 12/07/13   Pollina, Canary Brim, MD  traZODone (DESYREL) 150 MG tablet Take 75 mg by mouth at bedtime.     [provider]  verapamil (CALAN-SR) 120 MG CR tablet Take 120 mg by mouth at bedtime.  07/29/13 07/29/14  [provider]    Family History No family history on file.  Social History Social History   Tobacco Use  . Smoking status: Never Smoker  . Smokeless tobacco: Never Used  Substance Use Topics  . Alcohol  use: No  . Drug use: No     Allergies   Nitrofurantoin; Metoclopramide; and Tape   Review of Systems Review of Systems  Genitourinary: Positive for dysuria and frequency.  Musculoskeletal: Positive for back pain.  All other systems reviewed and are negative.    Physical Exam Updated Vital Signs BP 134/82   Pulse (!) 53   Temp 98.6 F (37 C)   Resp 12   Ht 5\' 3"  (1.6 m)   Wt 63.5 kg (140 lb)   SpO2 100%  BMI 24.80 kg/m   Physical Exam  Constitutional: She is oriented to person, place, and time.  Chronically ill   HENT:  Head: Normocephalic.  Mouth/Throat: Oropharynx is clear and moist.  Eyes: Pupils are equal, round, and reactive to light. Conjunctivae and EOM are normal.  Neck: Normal range of motion. Neck supple.  Diffuse paracervical tenderness   Cardiovascular: Normal rate, regular rhythm and normal heart sounds.  Pulmonary/Chest: Effort normal and breath sounds normal. No stridor. No respiratory distress. She has no wheezes.  Abdominal: Soft. Bowel sounds are normal. She exhibits no distension. There is no tenderness. There is no guarding.  Musculoskeletal:  Mild diffuse lumbar tenderness.   Neurological: She is alert and oriented to person, place, and time.  Bilateral foot drop (actually just 4/5 bilateral dorsiflexion). Nl reflexes, nl sensation throughout.   Skin: Skin is warm.  Psychiatric: She has a normal mood and affect.  Nursing note and vitals reviewed.    ED Treatments / Results  Labs (all labs ordered are listed, but only abnormal results are displayed) Labs Reviewed  URINALYSIS, ROUTINE W REFLEX MICROSCOPIC - Abnormal; Notable for the following components:      Result Value   Color, Urine STRAW (*)    Hgb urine dipstick SMALL (*)    Leukocytes, UA TRACE (*)    All other components within normal limits  COMPREHENSIVE METABOLIC PANEL - Abnormal; Notable for the following components:   Chloride 112 (*)    Total Protein 6.3 (*)    All  other components within normal limits  URINE CULTURE  CBC WITH DIFFERENTIAL/PLATELET  I-STAT TROPONIN, ED  I-STAT BETA HCG BLOOD, ED ([facility], [facility], [facility] ONLY)    EKG EKG Interpretation  Date/Time:  Sunday July 30 2017 15:39:45 EDT Ventricular Rate:  64 PR Interval:    QRS Duration: 89 QT Interval:  415 QTC Calculation: 429 R Axis:   55 Text Interpretation:  Sinus rhythm Borderline T abnormalities, inferior leads No significant change since last tracing Confirmed by Richardean Canal [phone]) on 07/30/2017 4:14:24 PM   Radiology Dg Chest 2 View  Result Date: 07/30/2017 CLINICAL DATA:  Chest pain. EXAM: CHEST - 2 VIEW COMPARISON:  None. FINDINGS: The heart size and mediastinal contours are within normal limits. Normal pulmonary vascularity. No focal consolidation, pleural effusion, or pneumothorax. No acute osseous abnormality. Old right sixth and seventh rib fractures. IMPRESSION: No active cardiopulmonary disease. Electronically Signed   By: Obie Dredge M.D.   On: 07/30/2017 16:36   Dg Cervical Spine Complete  Result Date: 07/30/2017 CLINICAL DATA:  Right neck pain after fall a few weeks ago. EXAM: CERVICAL SPINE - COMPLETE 4+ VIEW COMPARISON:  CT neck dated December 07, 2013. FINDINGS: The lateral view is diagnostic to the T3 level. There is no acute fracture or subluxation. Vertebral body heights are preserved. Trace anterolisthesis at C7-T1 due to facet arthropathy. Minimal disc height loss at C6-C7 and C7-T1. Remaining interveterbral disc spaces are maintained. The neural foramina are widely patent.Normal prevertebral soft tissues. IMPRESSION: 1.  No acute osseous abnormality. 2. Mild degenerative changes of the lower cervical spine. Electronically Signed   By: Obie Dredge M.D.   On: 07/30/2017 16:44   Dg Lumbar Spine Complete  Result Date: 07/30/2017 CLINICAL DATA:  Persistent low back pain after fall a few weeks ago. EXAM: LUMBAR SPINE - COMPLETE 4+ VIEW COMPARISON:  CT lumbar spine  dated September 11, 2014. FINDINGS: Five lumbar type vertebral bodies. No acute fracture or subluxation. Vertebral  body heights are preserved. Trace retrolisthesis at L2-L3, unchanged. Mild disc height loss and endplate spurring at L1-L2, L2-L3, and L3-L4, similar to prior study. Moderate right facet arthropathy at L5-S1. The sacroiliac joints are unremarkable. Unchanged simulator device in the right posterior lower back. Multiple surgical clips again seen within the abdomen. IMPRESSION: 1.  No acute osseous abnormality. 2. Mild degenerative changes of the lumbar spine, similar to prior study. Electronically Signed   By: Obie DredgeWilliam T Derry M.D.   On: 07/30/2017 16:41    Procedures Procedures (including critical care time)  Medications Ordered in ED Medications  fentaNYL (SUBLIMAZE) injection 50 mcg (50 mcg Intravenous Given 07/30/17 1626)     Initial Impression / Assessment and Plan / ED Course  I have reviewed the triage vital signs and the nursing notes.  Pertinent labs & imaging results that were available during my care of the patient were reviewed by me and considered in my medical decision making (see chart for details).    Cynthia Modenanita Cohen is a 52 y.o. female here with back pain, foot drop, dysuria. Back pain and foot drop for several weeks. She has some dysuria as well. I think she may have UTI that got her chronic foot drop worse. She has hx of tourettes and has neurology follow up. No hx of multiple sclerosis and patient can't get MRI since she has bladder stimulator. Since she is still ambulating well and has neurology follow up next week, she likely can get outpatient workup.   5:29 PM Labs unremarkable. UA unremarkable. Felt better with pain meds. Take gabapentin 200 mg TID. Told her that she can take extra dose if she is in pain. Can follow up with neurologist outpatient.   Final Clinical Impressions(s) / ED Diagnoses   Final diagnoses:  None    ED Discharge Orders    None         Charlynne PanderYao, David Hsienta, MD 07/30/17 1730

## 2017-07-30 NOTE — ED Triage Notes (Signed)
C/o thoracic back pain that radiates up to shoulder blades and into neck since yesterday that is positional.  History of chronic lower back pain.  Also reports bilateral foot drop for the last month that she is scheduled to see neurologist for on Wednesday.  Also concerned for UTI due to burning with urination and frequency x 1 week.

## 2017-07-30 NOTE — Discharge Instructions (Signed)
You may take extra gabapentin tablet in the morning or afternoon.   See your neurologist this week   Return to ER if you have worse back pain, trouble urinating, worse foot drop, trouble walking

## 2017-07-31 LAB — URINE CULTURE: Culture: <10000 — AB

## 2017-11-08 ENCOUNTER — Emergency Department (HOSPITAL_COMMUNITY)
Admission: EM | Admit: 2017-11-08 | Discharge: 2017-11-08 | Disposition: A | Discharge status: Home / Self Care | Payer: Medicare Other | Attending: Emergency Medicine | Admitting: Emergency Medicine

## 2017-11-08 ENCOUNTER — Other Ambulatory Visit: Payer: Self-pay

## 2017-11-08 ENCOUNTER — Encounter (HOSPITAL_COMMUNITY): Payer: Self-pay | Admitting: Emergency Medicine

## 2017-11-08 DIAGNOSIS — Z7982 Long term (current) use of aspirin: Secondary | ICD-10-CM | POA: Insufficient documentation

## 2017-11-08 DIAGNOSIS — A0472 Enterocolitis due to Clostridium difficile, not specified as recurrent: Secondary | ICD-10-CM | POA: Diagnosis not present

## 2017-11-08 DIAGNOSIS — E039 Hypothyroidism, unspecified: Secondary | ICD-10-CM | POA: Insufficient documentation

## 2017-11-08 DIAGNOSIS — R197 Diarrhea, unspecified: Secondary | ICD-10-CM | POA: Diagnosis present

## 2017-11-08 DIAGNOSIS — I10 Essential (primary) hypertension: Secondary | ICD-10-CM | POA: Diagnosis not present

## 2017-11-08 DIAGNOSIS — J45909 Unspecified asthma, uncomplicated: Secondary | ICD-10-CM | POA: Diagnosis not present

## 2017-11-08 DIAGNOSIS — E119 Type 2 diabetes mellitus without complications: Secondary | ICD-10-CM | POA: Insufficient documentation

## 2017-11-08 DIAGNOSIS — Z79899 Other long term (current) drug therapy: Secondary | ICD-10-CM | POA: Diagnosis not present

## 2017-11-08 DIAGNOSIS — Z7984 Long term (current) use of oral hypoglycemic drugs: Secondary | ICD-10-CM | POA: Diagnosis not present

## 2017-11-08 HISTORY — DX: Enterocolitis due to Clostridium difficile, not specified as recurrent: A04.72

## 2017-11-08 LAB — CBC
HEMATOCRIT: 38.3 % (ref 36.0–46.0)
Hemoglobin: 12.5 g/dL (ref 12.0–15.0)
MCH: 28.4 pg (ref 26.0–34.0)
MCHC: 32.6 g/dL (ref 30.0–36.0)
MCV: 87 fL (ref 78.0–100.0)
Platelets: 263 10*3/uL (ref 150–400)
RBC: 4.4 MIL/uL (ref 3.87–5.11)
RDW: 12 % (ref 11.5–15.5)
WBC: 10 10*3/uL (ref 4.0–10.5)

## 2017-11-08 LAB — COMPREHENSIVE METABOLIC PANEL
ALBUMIN: 3.1 g/dL — AB (ref 3.5–5.0)
ALT: 13 U/L (ref 0–44)
AST: 17 U/L (ref 15–41)
Alkaline Phosphatase: 75 U/L (ref 38–126)
Anion gap: 10 (ref 5–15)
BUN: 7 mg/dL (ref 6–20)
CHLORIDE: 108 mmol/L (ref 98–111)
CO2: 20 mmol/L — AB (ref 22–32)
Calcium: 8.7 mg/dL — ABNORMAL LOW (ref 8.9–10.3)
Creatinine, Ser: 0.65 mg/dL (ref 0.44–1.00)
GFR calc non Af Amer: >60 mL/min (ref >60)
GLUCOSE: 183 mg/dL — AB (ref 70–99)
POTASSIUM: 2.8 mmol/L — AB (ref 3.5–5.1)
SODIUM: 138 mmol/L (ref 135–145)
Total Bilirubin: 0.5 mg/dL (ref 0.3–1.2)
Total Protein: 5.5 g/dL — ABNORMAL LOW (ref 6.5–8.1)

## 2017-11-08 LAB — URINALYSIS, ROUTINE W REFLEX MICROSCOPIC
BILIRUBIN URINE: NEGATIVE
GLUCOSE, UA: NEGATIVE mg/dL
Ketones, ur: NEGATIVE mg/dL
Nitrite: NEGATIVE
PROTEIN: NEGATIVE mg/dL
Specific Gravity, Urine: 1.004 — ABNORMAL LOW (ref 1.005–1.030)
pH: 6 (ref 5.0–8.0)

## 2017-11-08 LAB — LIPASE, BLOOD: LIPASE: 25 U/L (ref 11–51)

## 2017-11-08 MED ORDER — VANCOMYCIN HCL 125 MG PO CAPS: 125.0000 mg | ORAL_CAPSULE | Freq: Four times a day (QID) | ORAL | 0 refills | Status: DC

## 2017-11-08 MED ORDER — POTASSIUM CHLORIDE CRYS ER 20 MEQ PO TBCR: 20.0000 meq | EXTENDED_RELEASE_TABLET | Freq: Every day | ORAL | 0 refills | Status: DC

## 2017-11-08 NOTE — ED Provider Notes (Signed)
[facility] EMERGENCY DEPARTMENT Provider Note   CSN: [id number] Arrival date & time: 11/08/17  2022     History   Chief Complaint Chief Complaint  Patient presents with  . Abnormal Lab    HPI Cynthia Cohen is a 52 y.o. female.  Patient presents to the emergency department with a chief complaint of diarrhea.  She states that she was sent to the emergency department because she was diagnosed with C. difficile.  She was seen at an outside facility and was instructed to come here for treatment and for evaluation of her electrolytes.  Reportedly her sodium and potassium were abnormal.  She states that she had an antibiotic (Flagyl) called in, but she did not get it filled.  She reports that about 2 weeks ago she was taking either amoxicillin or Augmentin for possible strep throat.  She denies any exposures to anyone with diarrhea.  Denies any fevers.  She is tolerating oral intake, and has been hydrating herself with a sports drink.  She denies any focal abdominal pain.  Denies any other associated symptoms.  The history is provided by the patient. No language interpreter was used.    Past Medical History:  Diagnosis Date  . Anxiety   . Arthritis    L knee   . Asthma    rare exposure to smoke or other irritatnt   . Back pain   . Clostridium difficile diarrhea   . Depression   . Diabetes mellitus without complication (HCC)    in combination with hyperinsulinnemia   . GERD (gastroesophageal reflux disease)   . Hyperinsulinemia   . Hypertension   . Hypothyroidism   . Neuromuscular disorder (HCC)    tourette syndrome, followed by neurology,neuropathy   . Thyroid disease     Patient Active Problem List   Diagnosis Date Noted  . 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 ABDOMINAL SURGERY    . APPENDECTOMY    . BLADDER SURGERY     bladder tumor removed- 1997,bladder pacemaker - not active at the  present time   . BOWEL RESECTION    . CARDIAC CATHETERIZATION     done for stress related event, in [place] , [place]- told that every thing was ok, punctured femoral artery- treated /w Plasma   . CERVICAL ABLATION    . CHOLECYSTECTOMY    . EXCISIONAL TOTAL KNEE ARTHROPLASTY Left 12/09/2013   Procedure: POLYEXCHANGE WITH SYNOVECTOMY;  Surgeon: Dannielle Huh, MD;  Location: [facility] OR;  Service: Orthopedics;  Laterality: Left;  Marland Kitchen GASTRIC BYPASS    . HERNIA REPAIR    . ILEOSTOMY REVISION    . REVISION TOTAL KNEE ARTHROPLASTY Left 12/09/2013   dr Sherlean Foot  . TONSILLECTOMY    . TOTAL KNEE ARTHROPLASTY Left 2014   at [facility] History   None      Home Medications    Prior to Admission medications   Medication Sig Start Date End Date Taking? Authorizing Provider  albuterol (PROVENTIL HFA;VENTOLIN HFA) 108 (90 BASE) MCG/ACT inhaler Inhale 1 puff into the lungs every 6 (six) hours as needed for wheezing.     [provider]  aspirin EC 325 MG tablet Take 1 tablet (325 mg total) by mouth daily. 12/10/13   Altamese Cabal, PA-C  busPIRone (BUSPAR) 10 MG tablet Take 10 mg by mouth 3 (three) times daily.    [provider]  calcium carbonate (  OS-CAL) 600 MG TABS tablet Take 600 mg by mouth daily with breakfast.    [provider]  Cholecalciferol (VITAMIN D) 2000 UNITS CAPS Take 1 capsule by mouth daily.     [provider]  clonazePAM (KLONOPIN) 1 MG tablet Take 0.5 mg by mouth every 8 (eight) hours.     [provider]  cyclobenzaprine (FLEXERIL) 5 MG tablet Take 1 tablet (5 mg total) by mouth 3 (three) times daily as needed. 07/30/17   Charlynne Pander, MD  DULoxetine (CYMBALTA) 60 MG capsule Take 60 mg by mouth daily before breakfast.     [provider]  fluticasone (FLONASE) 50 MCG/ACT nasal spray Place 2 sprays into the nose daily.  06/06/12   [provider]  gabapentin (NEURONTIN) 300 MG capsule Take 300 mg by mouth See admin  instructions. Take 300mg  in the morning and 600mg  at bedtime    [provider]  haloperidol (HALDOL) 0.5 MG tablet Take 0.5 mg by mouth every 8 (eight) hours as needed for agitation.  04/27/12   [provider]  HYDROcodone-acetaminophen (NORCO) 10-325 MG per tablet Take 1-2 tablets by mouth every 4 (four) hours as needed (breakthrough pain). 12/10/13   Altamese Cabal, PA-C  lamoTRIgine (LAMICTAL) 200 MG tablet Take 400 mg by mouth at bedtime.    [provider]  levothyroxine (SYNTHROID, LEVOTHROID) 50 MCG tablet Take 50 mcg by mouth daily before breakfast.    [provider]  lisinopril (PRINIVIL,ZESTRIL) 40 MG tablet Take 40 mg by mouth daily.    [provider]  metFORMIN (GLUCOPHAGE-XR) 500 MG 24 hr tablet Take 500 mg by mouth every evening.  07/29/13   [provider]  methocarbamol (ROBAXIN) 500 MG tablet Take 1-2 tablets (500-1,000 mg total) by mouth 4 (four) times daily. 12/10/13   Altamese Cabal, PA-C  montelukast (SINGULAIR) 10 MG tablet Take 10 mg by mouth daily before breakfast.     [provider]  morphine (MSIR) 15 MG tablet Take 1 tablet (15 mg total) by mouth every 4 (four) hours as needed for severe pain. 12/10/13   Altamese Cabal, PA-C  Multiple Vitamin (MULTIVITAMIN WITH MINERALS) TABS tablet Take 1 tablet by mouth daily.    [provider]  nitrofurantoin, macrocrystal-monohydrate, (MACROBID) 100 MG capsule Take 1 capsule (100 mg total) by mouth 2 (two) times daily. X 7 days 12/07/13   Gilda Crease, MD  omeprazole (PRILOSEC) 20 MG capsule Take 20 mg by mouth 2 (two) times daily before a meal.     [provider]  Oxcarbazepine (TRILEPTAL) 300 MG tablet Take 150 mg by mouth 2 (two) times daily.    [provider]  predniSONE (STERAPRED UNI-PAK) 10 MG tablet Take by mouth daily. As directed on pack - 6 day taper pack 12/07/13   Pollina, Canary Brim, MD  traZODone (DESYREL) 150 MG  tablet Take 75 mg by mouth at bedtime.     [provider]  vancomycin (VANCOCIN) 125 MG capsule Take 1 capsule (125 mg total) by mouth 4 (four) times daily. 11/08/17   Roxy Horseman, PA-C  verapamil (CALAN-SR) 120 MG CR tablet Take 120 mg by mouth at bedtime.  07/29/13 07/29/14  [provider]    Family History No family history on file.  Social History Social History   Tobacco Use  . Smoking status: Never Smoker  . Smokeless tobacco: Never Used  Substance Use Topics  . Alcohol use: No  . Drug use: No  Allergies   Nitrofurantoin; Metoclopramide; and Tape   Review of Systems Review of Systems  All other systems reviewed and are negative.    Physical Exam Updated Vital Signs BP (!) 102/55   Pulse 66   Temp 99 F (37.2 C) (Oral)   Resp 17   Ht 5\' 3"  (1.6 m)   Wt 63.5 kg   SpO2 100%   BMI 24.80 kg/m   Physical Exam  Constitutional: She is oriented to person, place, and time. She appears well-developed and well-nourished.  HENT:  Head: Normocephalic and atraumatic.  Eyes: Pupils are equal, round, and reactive to light. Conjunctivae and EOM are normal.  Neck: Normal range of motion. Neck supple.  Cardiovascular: Normal rate and regular rhythm. Exam reveals no gallop and no friction rub.  No murmur heard. Pulmonary/Chest: Effort normal and breath sounds normal. No respiratory distress. She has no wheezes. She has no rales. She exhibits no tenderness.  Abdominal: Soft. Bowel sounds are normal. She exhibits no distension and no mass. There is no tenderness. There is no rebound and no guarding.  Bandage over abdomen from chronic draining wound (~ 1 year)  Musculoskeletal: Normal range of motion. She exhibits no edema or tenderness.  Neurological: She is alert and oriented to person, place, and time.  Skin: Skin is warm and dry.  Psychiatric: She has a normal mood and affect. Her behavior is normal. Judgment and thought content normal.  Nursing  note and vitals reviewed.    ED Treatments / Results  Labs (all labs ordered are listed, but only abnormal results are displayed) Labs Reviewed  COMPREHENSIVE METABOLIC PANEL - Abnormal; Notable for the following components:      Result Value   Potassium 2.8 (*)    CO2 20 (*)    Glucose, Bld 183 (*)    Calcium 8.7 (*)    Total Protein 5.5 (*)    Albumin 3.1 (*)    All other components within normal limits  URINALYSIS, ROUTINE W REFLEX MICROSCOPIC - Abnormal; Notable for the following components:   Color, Urine STRAW (*)    Specific Gravity, Urine 1.004 (*)    Hgb urine dipstick SMALL (*)    Leukocytes, UA SMALL (*)    Bacteria, UA RARE (*)    All other components within normal limits  LIPASE, BLOOD  CBC    EKG None  Radiology No results found.  Procedures Procedures (including critical care time)  Medications Ordered in ED Medications - No data to display   Initial Impression / Assessment and Plan / ED Course  I have reviewed the triage vital signs and the nursing notes.  Pertinent labs & imaging results that were available during my care of the patient were reviewed by me and considered in my medical decision making (see chart for details).     Patient with diarrhea.  Was recently diagnosed with C. difficile.  She looks very well.  She is nontoxic.  She is not septic.  Will discharge to home with oral vancomycin.  Discussed with Dr. Eudelia Bunch, who agrees with plan.  Final Clinical Impressions(s) / ED Diagnoses   Final diagnoses:  C. difficile diarrhea    ED Discharge Orders         Ordered    vancomycin (VANCOCIN) 125 MG capsule  4 times daily     11/08/17 2323    potassium chloride SA (K-DUR,KLOR-CON) 20 MEQ tablet  Daily     11/08/17 2340  Roxy Horseman, PA-C 11/08/17 2341    Nira Conn, MD 11/09/17 [phone]

## 2017-11-08 NOTE — ED Triage Notes (Signed)
Pt reports [facility] Called her to come here for abnormal labs of potassium and sodium. Also that she is positive for C.Diff. Pt reports diarrhea 4-5x/day, decreased appetite and fatigue. Denies pain.

## 2017-11-08 NOTE — ED Notes (Signed)
ED Provider at bedside. 

## 2018-04-16 ENCOUNTER — Ambulatory Visit (HOSPITAL_COMMUNITY)
Admission: EM | Admit: 2018-04-16 | Discharge: 2018-04-16 | Disposition: A | Discharge status: Home / Self Care | Payer: Medicare Other | Attending: Internal Medicine | Admitting: Internal Medicine

## 2018-04-16 ENCOUNTER — Encounter (HOSPITAL_COMMUNITY): Payer: Self-pay | Admitting: Emergency Medicine

## 2018-04-16 DIAGNOSIS — J029 Acute pharyngitis, unspecified: Secondary | ICD-10-CM | POA: Diagnosis not present

## 2018-04-16 NOTE — ED Triage Notes (Signed)
Pt here for sore throat x 2 days 

## 2018-04-16 NOTE — ED Provider Notes (Signed)
[facility]    CSN: [id number] Arrival date & time: 04/16/18  1126     History   Chief Complaint Chief Complaint  Patient presents with  . Sore Throat    HPI Cynthia Cohen is a 53 y.o. female comes to urgent care today with complaints of a sore throat.   Patient has some nasal congestion.  No fever or chills.  He has been caring for a child who was diagnosed with adenovirus/rhinovirus upper respiratory infection a few days ago.  She denies any nausea vomiting.  No shortness of breath, cough or sputum production.  No fever or chills.   Past Medical History:  Diagnosis Date  . Anxiety   . Arthritis    L knee   . Asthma    rare exposure to smoke or other irritatnt   . Back pain   . Clostridium difficile diarrhea   . Depression   . Diabetes mellitus without complication (HCC)    in combination with hyperinsulinnemia   . GERD (gastroesophageal reflux disease)   . Hyperinsulinemia   . Hypertension   . Hypothyroidism   . Neuromuscular disorder (HCC)    tourette syndrome, followed by neurology,neuropathy   . Thyroid disease     Patient Active Problem List   Diagnosis Date Noted  . 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 ABDOMINAL SURGERY    . APPENDECTOMY    . BLADDER SURGERY     bladder tumor removed- 1997,bladder pacemaker - not active at the present time   . BOWEL RESECTION    . CARDIAC CATHETERIZATION     done for stress related event, in [place] , [place]- told that every thing was ok, punctured femoral artery- treated /w Plasma   . CERVICAL ABLATION    . CHOLECYSTECTOMY    . EXCISIONAL TOTAL KNEE ARTHROPLASTY Left 12/09/2013   Procedure: POLYEXCHANGE WITH SYNOVECTOMY;  Surgeon: Dannielle Huh, MD;  Location: [facility] OR;  Service: Orthopedics;  Laterality: Left;  Marland Kitchen GASTRIC BYPASS    . HERNIA REPAIR    . ILEOSTOMY REVISION    . REVISION TOTAL KNEE ARTHROPLASTY Left 12/09/2013   dr Sherlean Foot  . TONSILLECTOMY     . TOTAL KNEE ARTHROPLASTY Left 2014   at [facility] History   No obstetric history on file.      Home Medications    Prior to Admission medications   Medication Sig Start Date End Date Taking? Authorizing Provider  albuterol (PROVENTIL HFA;VENTOLIN HFA) 108 (90 BASE) MCG/ACT inhaler Inhale 1 puff into the lungs every 6 (six) hours as needed for wheezing.     [provider]  aspirin EC 325 MG tablet Take 1 tablet (325 mg total) by mouth daily. 12/10/13   Altamese Cabal, PA-C  busPIRone (BUSPAR) 10 MG tablet Take 10 mg by mouth 3 (three) times daily.    [provider]  calcium carbonate (OS-CAL) 600 MG TABS tablet Take 600 mg by mouth daily with breakfast.    [provider]  Cholecalciferol (VITAMIN D) 2000 UNITS CAPS Take 1 capsule by mouth daily.     [provider]  clonazePAM (KLONOPIN) 1 MG tablet Take 0.5 mg by mouth every 8 (eight) hours.     [provider]  cyclobenzaprine (FLEXERIL) 5 MG tablet Take 1 tablet (5 mg total) by mouth 3 (three) times daily as needed. 07/30/17   Charlynne Pander, MD  DULoxetine (CYMBALTA)  60 MG capsule Take 60 mg by mouth daily before breakfast.     [provider]  fluticasone (FLONASE) 50 MCG/ACT nasal spray Place 2 sprays into the nose daily.  06/06/12   [provider]  gabapentin (NEURONTIN) 300 MG capsule Take 300 mg by mouth See admin instructions. Take  in the morning and  at bedtime    [provider]  haloperidol (HALDOL) 0.5 MG tablet Take 0.5 mg by mouth every 8 (eight) hours as needed for agitation.  04/27/12   [provider]  HYDROcodone-acetaminophen (NORCO) 10-325 MG per tablet Take 1-2 tablets by mouth every 4 (four) hours as needed (breakthrough pain). 12/10/13   Altamese Cabal, PA-C  lamoTRIgine (LAMICTAL) 200 MG tablet Take 400 mg by mouth at bedtime.    [provider]  levothyroxine (SYNTHROID, LEVOTHROID) 50 MCG tablet Take  50 mcg by mouth daily before breakfast.    [provider]  lisinopril (PRINIVIL,ZESTRIL) 40 MG tablet Take 40 mg by mouth daily.    [provider]  metFORMIN (GLUCOPHAGE-XR) 500 MG 24 hr tablet Take 500 mg by mouth every evening.  07/29/13   [provider]  methocarbamol (ROBAXIN) 500 MG tablet Take 1-2 tablets (500-1,000 mg total) by mouth 4 (four) times daily. 12/10/13   Altamese Cabal, PA-C  montelukast (SINGULAIR) 10 MG tablet Take 10 mg by mouth daily before breakfast.     [provider]  morphine (MSIR) 15 MG tablet Take 1 tablet (15 mg total) by mouth every 4 (four) hours as needed for severe pain. 12/10/13   Altamese Cabal, PA-C  Multiple Vitamin (MULTIVITAMIN WITH MINERALS) TABS tablet Take 1 tablet by mouth daily.    [provider]  nitrofurantoin, macrocrystal-monohydrate, (MACROBID) 100 MG capsule Take 1 capsule (100 mg total) by mouth 2 (two) times daily. X 7 days 12/07/13   Gilda Crease, MD  omeprazole (PRILOSEC) 20 MG capsule Take 20 mg by mouth 2 (two) times daily before a meal.     [provider]  Oxcarbazepine (TRILEPTAL) 300 MG tablet Take 150 mg by mouth 2 (two) times daily.    [provider]  potassium chloride SA (K-DUR,KLOR-CON) 20 MEQ tablet Take 1 tablet (20 mEq total) by mouth daily. 11/08/17   Roxy Horseman, PA-C  predniSONE (STERAPRED UNI-PAK) 10 MG tablet Take by mouth daily. As directed on pack - 6 day taper pack 12/07/13   Pollina, Canary Brim, MD  traZODone (DESYREL) 150 MG tablet Take 75 mg by mouth at bedtime.     [provider]  vancomycin (VANCOCIN) 125 MG capsule Take 1 capsule (125 mg total) by mouth 4 (four) times daily. 11/08/17   Roxy Horseman, PA-C  verapamil (CALAN-SR) 120 MG CR tablet Take 120 mg by mouth at bedtime.  07/29/13 07/29/14  [provider]    Family History History reviewed. No pertinent family history.  Social History Social History    Tobacco Use  . Smoking status: Never Smoker  . Smokeless tobacco: Never Used  Substance Use Topics  . Alcohol use: No  . Drug use: No     Allergies   Nitrofurantoin; Metoclopramide; and Tape   Review of Systems Review of Systems  Constitutional: Negative for activity change and appetite change.  HENT: Negative for congestion, postnasal drip and rhinorrhea.   Eyes: Negative for discharge and itching.  Respiratory: Negative for cough and chest tightness.   Gastrointestinal: Negative for abdominal distention and abdominal pain.  Skin: Negative for rash  and wound.  Neurological: Negative for dizziness, weakness and light-headedness.     Physical Exam Triage Vital Signs ED Triage Vitals [04/16/18 1201]  Enc Vitals Group     BP 134/78     Pulse Rate 73     Resp 18     Temp 98.6 F (37 C)     Temp Source Temporal     SpO2 100 %     Weight      Height      Head Circumference      Peak Flow      Pain Score 5     Pain Loc      Pain Edu?      Excl. in GC?    No data found.  Updated Vital Signs BP 134/78 (BP Location: Right Arm)   Pulse 73   Temp 98.6 F (37 C) (Temporal)   Resp 18   SpO2 100%   Visual Acuity Right Eye Distance:   Left Eye Distance:   Bilateral Distance:    Right Eye Near:   Left Eye Near:    Bilateral Near:     Physical Exam Constitutional:      Appearance: She is well-developed. She is not ill-appearing.  HENT:     Right Ear: Tympanic membrane normal. No drainage. Tympanic membrane is not erythematous.     Left Ear: Tympanic membrane normal. Tympanic membrane is not erythematous.     Mouth/Throat:     Mouth: Mucous membranes are moist.     Pharynx: No oropharyngeal exudate or posterior oropharyngeal erythema.     Tonsils: No tonsillar exudate. Swelling: 0 on the right. 0 on the left.  Eyes:     Conjunctiva/sclera: Conjunctivae normal.  Neck:     Musculoskeletal: Normal range of motion.  Cardiovascular:     Rate and Rhythm:  Normal rate and regular rhythm.     Heart sounds: Normal heart sounds. No friction rub.  Pulmonary:     Effort: Pulmonary effort is normal.     Breath sounds: Normal breath sounds.  Abdominal:     General: Bowel sounds are normal.     Palpations: Abdomen is soft.  Neurological:     Mental Status: She is alert.      UC Treatments / Results  Labs (all labs ordered are listed, but only abnormal results are displayed) Labs Reviewed - No data to display  EKG None  Radiology No results found.  Procedures Procedures (including critical care time)  Medications Ordered in UC Medications - No data to display  Initial Impression / Assessment and Plan / UC Course  I have reviewed the triage vital signs and the nursing notes.  Pertinent labs & imaging results that were available during my care of the patient were reviewed by me and considered in my medical decision making (see chart for details).     1.  Acute nasopharyngitis: Supportive care Optimize fluid intake Tylenol/Motrin for pain and fever. Final Clinical Impressions(s) / UC Diagnoses   Final diagnoses:  Viral pharyngitis   Discharge Instructions   None    ED Prescriptions    None     Controlled Substance Prescriptions [place] Controlled Substance Registry consulted? No   Merrilee Jansky, MD 04/18/18 1050

## 2018-07-13 ENCOUNTER — Encounter (HOSPITAL_BASED_OUTPATIENT_CLINIC_OR_DEPARTMENT_OTHER): Payer: Self-pay | Admitting: *Deleted

## 2018-07-13 ENCOUNTER — Other Ambulatory Visit: Payer: Self-pay

## 2018-07-13 ENCOUNTER — Emergency Department (HOSPITAL_BASED_OUTPATIENT_CLINIC_OR_DEPARTMENT_OTHER)
Admission: EM | Admit: 2018-07-13 | Discharge: 2018-07-13 | Disposition: A | Discharge status: Home / Self Care | Payer: PRIVATE HEALTH INSURANCE | Attending: Emergency Medicine | Admitting: Emergency Medicine

## 2018-07-13 DIAGNOSIS — E119 Type 2 diabetes mellitus without complications: Secondary | ICD-10-CM | POA: Insufficient documentation

## 2018-07-13 DIAGNOSIS — Z79899 Other long term (current) drug therapy: Secondary | ICD-10-CM | POA: Diagnosis not present

## 2018-07-13 DIAGNOSIS — E039 Hypothyroidism, unspecified: Secondary | ICD-10-CM | POA: Insufficient documentation

## 2018-07-13 DIAGNOSIS — J45909 Unspecified asthma, uncomplicated: Secondary | ICD-10-CM | POA: Diagnosis not present

## 2018-07-13 DIAGNOSIS — I10 Essential (primary) hypertension: Secondary | ICD-10-CM | POA: Insufficient documentation

## 2018-07-13 DIAGNOSIS — Z96652 Presence of left artificial knee joint: Secondary | ICD-10-CM | POA: Diagnosis not present

## 2018-07-13 DIAGNOSIS — R531 Weakness: Secondary | ICD-10-CM | POA: Diagnosis not present

## 2018-07-13 DIAGNOSIS — R519 Headache, unspecified: Secondary | ICD-10-CM

## 2018-07-13 DIAGNOSIS — R51 Headache: Secondary | ICD-10-CM | POA: Insufficient documentation

## 2018-07-13 DIAGNOSIS — Z7984 Long term (current) use of oral hypoglycemic drugs: Secondary | ICD-10-CM | POA: Diagnosis not present

## 2018-07-13 LAB — BASIC METABOLIC PANEL
Anion gap: 7 (ref 5–15)
BUN: 15 mg/dL (ref 6–20)
CO2: 25 mmol/L (ref 22–32)
Calcium: 8.9 mg/dL (ref 8.9–10.3)
Chloride: 111 mmol/L (ref 98–111)
Creatinine, Ser: 0.73 mg/dL (ref 0.44–1.00)
GFR calc Af Amer: >60 mL/min (ref >60)
GFR calc non Af Amer: >60 mL/min (ref >60)
Glucose, Bld: 88 mg/dL (ref 70–99)
Potassium: 3.3 mmol/L — ABNORMAL LOW (ref 3.5–5.1)
Sodium: 143 mmol/L (ref 135–145)

## 2018-07-13 LAB — CBC WITH DIFFERENTIAL/PLATELET
Abs Immature Granulocytes: 0.02 10*3/uL (ref 0.00–0.07)
Basophils Absolute: 0 10*3/uL (ref 0.0–0.1)
Basophils Relative: 1 %
Eosinophils Absolute: 0.8 10*3/uL — ABNORMAL HIGH (ref 0.0–0.5)
Eosinophils Relative: 11 %
HCT: 40.5 % (ref 36.0–46.0)
Hemoglobin: 12.5 g/dL (ref 12.0–15.0)
Immature Granulocytes: 0 %
Lymphocytes Relative: 28 %
Lymphs Abs: 1.9 10*3/uL (ref 0.7–4.0)
MCH: 27.7 pg (ref 26.0–34.0)
MCHC: 30.9 g/dL (ref 30.0–36.0)
MCV: 89.8 fL (ref 80.0–100.0)
Monocytes Absolute: 0.6 10*3/uL (ref 0.1–1.0)
Monocytes Relative: 8 %
Neutro Abs: 3.7 10*3/uL (ref 1.7–7.7)
Neutrophils Relative %: 52 %
Platelets: 260 10*3/uL (ref 150–400)
RBC: 4.51 MIL/uL (ref 3.87–5.11)
RDW: 12.8 % (ref 11.5–15.5)
WBC: 7 10*3/uL (ref 4.0–10.5)
nRBC: 0 % (ref 0.0–0.2)

## 2018-07-13 LAB — URINALYSIS, MICROSCOPIC (REFLEX)

## 2018-07-13 LAB — URINALYSIS, ROUTINE W REFLEX MICROSCOPIC
Bilirubin Urine: NEGATIVE
Glucose, UA: NEGATIVE mg/dL
Ketones, ur: NEGATIVE mg/dL
Nitrite: NEGATIVE
Protein, ur: NEGATIVE mg/dL
Specific Gravity, Urine: 1.01 (ref 1.005–1.030)
pH: 6 (ref 5.0–8.0)

## 2018-07-13 LAB — CBG MONITORING, ED: Glucose-Capillary: 84 mg/dL (ref 70–99)

## 2018-07-13 MED ORDER — KETOROLAC TROMETHAMINE 15 MG/ML IJ SOLN
15.0000 mg | Freq: Once | INTRAMUSCULAR | Status: AC
  Administered 2018-07-13: 15 mg via INTRAMUSCULAR
  Filled 2018-07-13: qty 1

## 2018-07-13 MED ORDER — DIPHENHYDRAMINE HCL 50 MG/ML IJ SOLN
25.0000 mg | Freq: Once | INTRAMUSCULAR | Status: AC
  Administered 2018-07-13: 25 mg via INTRAVENOUS
  Filled 2018-07-13: qty 1

## 2018-07-13 MED ORDER — DEXAMETHASONE SODIUM PHOSPHATE 10 MG/ML IJ SOLN
10.0000 mg | Freq: Once | INTRAMUSCULAR | Status: AC
  Administered 2018-07-13: 10 mg via INTRAVENOUS
  Filled 2018-07-13: qty 1

## 2018-07-13 MED ORDER — PROCHLORPERAZINE EDISYLATE 10 MG/2ML IJ SOLN
10.0000 mg | Freq: Once | INTRAMUSCULAR | Status: AC
  Administered 2018-07-13: 10 mg via INTRAVENOUS
  Filled 2018-07-13: qty 2

## 2018-07-13 MED ORDER — SODIUM CHLORIDE 0.9 % IV BOLUS
1000.0000 mL | Freq: Once | INTRAVENOUS | Status: AC
  Administered 2018-07-13: 1000 mL via INTRAVENOUS

## 2018-07-13 NOTE — Discharge Instructions (Addendum)
Today you were evaluated at the emergency department for a headache. ° °1. Medications: continue usual home medications °2. Treatment: rest, drink plenty of fluids, if headache persists take 800mmg ibuprofen with caffeine °3. Follow Up: Please followup with your primary doctor in 3 days and neurology within 1 week for discussion of your diagnoses and further evaluation after today's visit; if you do not have a primary care doctor use the resource guide provided to find one; Please return to the ER for double vision, speech difficulty, gait disturbance, persistent vomiting or other concerns. ° °Headache: ° °You are having a headache. No specific cause was found today for your headache. It may have been a migraine or other cause of headache. Stress, anxiety, fatigue, and depression are common triggers for headaches. Your headache today does not appear to be life-threatening or require hospitalization, but often the exact cause of headaches is not determined in the emergency department. Therefore, followup with your doctor is very important to find out what may have caused your headache, and whether or not you need any further diagnostic testing or treatment. Sometimes headaches can appear benign but then more serious symptoms can develop which should prompt an immediate reevaluation by your doctor or the emergency department. ° °Hydration: Have a goal of about a half liter of water every couple hours to stay well hydrated.  ° °Sleep: Please be sure to get plenty of sleep with a goal of 8 hours per night. Having a regular bed time and bedtime routine can help with this. ° °Screens: Reduce the amount of time you are in front of screens.  Take about a 5-10-minute break every hour or every couple hours to give your eyes rest.  Do not use screens in dark rooms.  Glasses with a blue light filter may also help reduce eye fatigue. ° °Stress: Take steps to reduce stress as much as possible.  ° °Seek immediate medical attention  if: ° °You develop possible problems with medications prescribed. °The medications don't resolve your headache, if it recurs, or if you have multiple episodes of vomiting or can't take fluids by mouth °You have a change from the usual headache. °If you developed a sudden severe headache or confusion, become poorly responsive or faint, developed a fever above 100.4 or problems breathing, have a change in speech, vision, swallowing or understanding, or developed new weakness, numbness, tingling, incoordination or have a seizure. ° ° ° °

## 2018-07-13 NOTE — ED Triage Notes (Signed)
Pt  c/o h/a x 1 week

## 2018-07-13 NOTE — ED Provider Notes (Signed)
[facility] EMERGENCY DEPARTMENT Provider Note   CSN: [id number] Arrival date & time: 07/13/18  1305    History   Chief Complaint Chief Complaint  Patient presents with  . Headache    HPI Cynthia Cohen is a 53 y.o. female with history of diabetes, hypertension presents emergency department today with chief complaint of headache x1 week.  Patient says she has a history of migraines and states this feels similar.  She has been taking Excedrin migraine without symptom relief.  She has been seen at urgent care twice, at the first visit 1 week ago she had an ear infection and took antibiotics.  She went again yesterday and was told she might have a sinus infection, she was prescribed Augmentin but has not yet taken it.  She describes her headache as throbbing pain located in her forehead and radiates to the back of her neck.  She rates the pain 7 out of 10 in severity.  The headache has progressively worsened since onset.  She denies sudden onset or this being the worst headache of her life.  Patient also admits to feeling generalized weakness since the headache started.  She states she has a history of diabetes but has not been on any medication for several years as she lost a lot of weight and was able to control her sugars with diet and exercise.  Denies fever, shortness of breath, cough, sick contacts. Also denies fever, syncope, head trauma, photophobia, phonophobia,  N/V, visual changes, stiff neck, neck pain, rash, seizure, jaw claudication. History provided by patient with additional history obtained from chart review.      Past Medical History:  Diagnosis Date  . Anxiety   . Arthritis    L knee   . Asthma    rare exposure to smoke or other irritatnt   . Back pain   . Clostridium difficile diarrhea   . Depression   . Diabetes mellitus without complication (HCC)    in combination with hyperinsulinnemia   . GERD (gastroesophageal reflux disease)   . Hyperinsulinemia    . Hypertension   . Hypothyroidism   . Neuromuscular disorder (HCC)    tourette syndrome, followed by neurology,neuropathy   . Thyroid disease     Patient Active Problem List   Diagnosis Date Noted  . 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 ABDOMINAL SURGERY    . APPENDECTOMY    . BLADDER SURGERY     bladder tumor removed- 1997,bladder pacemaker - not active at the present time   . BOWEL RESECTION    . CARDIAC CATHETERIZATION     done for stress related event, in [place] , [place]- told that every thing was ok, punctured femoral artery- treated /w Plasma   . CERVICAL ABLATION    . CHOLECYSTECTOMY    . EXCISIONAL TOTAL KNEE ARTHROPLASTY Left 12/09/2013   Procedure: POLYEXCHANGE WITH SYNOVECTOMY;  Surgeon: Dannielle Huh, MD;  Location: [facility] OR;  Service: Orthopedics;  Laterality: Left;  Marland Kitchen GASTRIC BYPASS    . HERNIA REPAIR    . ILEOSTOMY REVISION    . REVISION TOTAL KNEE ARTHROPLASTY Left 12/09/2013   dr Sherlean Foot  . TONSILLECTOMY    . TOTAL KNEE ARTHROPLASTY Left 2014   at [facility] History   No obstetric history on file.      Home Medications    Prior to Admission medications   Medication Sig Start Date  End Date Taking? Authorizing Provider  albuterol (PROVENTIL HFA;VENTOLIN HFA) 108 (90 BASE) MCG/ACT inhaler Inhale 1 puff into the lungs every 6 (six) hours as needed for wheezing.     [provider]  aspirin EC 325 MG tablet Take 1 tablet (325 mg total) by mouth daily. 12/10/13   Altamese Cabal, PA-C  busPIRone (BUSPAR) 10 MG tablet Take 10 mg by mouth 3 (three) times daily.    [provider]  calcium carbonate (OS-CAL) 600 MG TABS tablet Take 600 mg by mouth daily with breakfast.    [provider]  Cholecalciferol (VITAMIN D) 2000 UNITS CAPS Take 1 capsule by mouth daily.     [provider]  clonazePAM (KLONOPIN) 1 MG tablet Take 0.5 mg by mouth every 8 (eight) hours.     [provider]  cyclobenzaprine (FLEXERIL) 5 MG tablet Take 1 tablet (5 mg total) by mouth 3 (three) times daily as needed. 07/30/17   Charlynne Pander, MD  DULoxetine (CYMBALTA) 60 MG capsule Take 60 mg by mouth daily before breakfast.     [provider]  fluticasone (FLONASE) 50 MCG/ACT nasal spray Place 2 sprays into the nose daily.  06/06/12   [provider]  gabapentin (NEURONTIN) 300 MG capsule Take 300 mg by mouth See admin instructions. Take  in the morning and  at bedtime    [provider]  haloperidol (HALDOL) 0.5 MG tablet Take 0.5 mg by mouth every 8 (eight) hours as needed for agitation.  04/27/12   [provider]  HYDROcodone-acetaminophen (NORCO) 10-325 MG per tablet Take 1-2 tablets by mouth every 4 (four) hours as needed (breakthrough pain). 12/10/13   Altamese Cabal, PA-C  lamoTRIgine (LAMICTAL) 200 MG tablet Take 400 mg by mouth at bedtime.    [provider]  levothyroxine (SYNTHROID, LEVOTHROID) 50 MCG tablet Take 50 mcg by mouth daily before breakfast.    [provider]  lisinopril (PRINIVIL,ZESTRIL) 40 MG tablet Take 40 mg by mouth daily.    [provider]  metFORMIN (GLUCOPHAGE-XR) 500 MG 24 hr tablet Take 500 mg by mouth every evening.  07/29/13   [provider]  methocarbamol (ROBAXIN) 500 MG tablet Take 1-2 tablets (500-1,000 mg total) by mouth 4 (four) times daily. 12/10/13   Altamese Cabal, PA-C  montelukast (SINGULAIR) 10 MG tablet Take 10 mg by mouth daily before breakfast.     [provider]  morphine (MSIR) 15 MG tablet Take 1 tablet (15 mg total) by mouth every 4 (four) hours as needed for severe pain. 12/10/13   Altamese Cabal, PA-C  Multiple Vitamin (MULTIVITAMIN WITH MINERALS) TABS tablet Take 1 tablet by mouth daily.    [provider]  nitrofurantoin, macrocrystal-monohydrate, (MACROBID) 100 MG capsule Take 1 capsule (100 mg total) by mouth 2 (two) times daily.  X 7 days 12/07/13   Gilda Crease, MD  omeprazole (PRILOSEC) 20 MG capsule Take 20 mg by mouth 2 (two) times daily before a meal.     [provider]  Oxcarbazepine (TRILEPTAL) 300 MG tablet Take 150 mg by mouth 2 (two) times daily.    [provider]  potassium chloride SA (K-DUR,KLOR-CON) 20 MEQ tablet Take 1 tablet (20 mEq total) by mouth daily. 11/08/17   Roxy Horseman, PA-C  predniSONE (STERAPRED UNI-PAK) 10 MG tablet Take by mouth daily. As directed on pack - 6 day taper pack 12/07/13   Pollina, Canary Brim, MD  traZODone (DESYREL) 150 MG tablet Take  75 mg by mouth at bedtime.     [provider]  vancomycin (VANCOCIN) 125 MG capsule Take 1 capsule (125 mg total) by mouth 4 (four) times daily. 11/08/17   Roxy HorsemanBrowning, Robert, PA-C  verapamil (CALAN-SR) 120 MG CR tablet Take 120 mg by mouth at bedtime.  07/29/13 07/29/14  [provider]    Family History History reviewed. No pertinent family history.  Social History Social History   Tobacco Use  . Smoking status: Never Smoker  . Smokeless tobacco: Never Used  Substance Use Topics  . Alcohol use: No  . Drug use: No     Allergies   Nitrofurantoin; Metoclopramide; and Tape   Review of Systems Review of Systems  Constitutional: Negative for chills and fever.  HENT: Negative for congestion, ear discharge, ear pain, sinus pressure, sinus pain and sore throat.   Eyes: Negative for pain and redness.  Respiratory: Negative for cough and shortness of breath.   Cardiovascular: Negative for chest pain.  Gastrointestinal: Negative for abdominal pain, constipation, diarrhea, nausea and vomiting.  Genitourinary: Negative for dysuria and hematuria.  Musculoskeletal: Negative for back pain and neck pain.  Skin: Negative for wound.  Neurological: Positive for weakness and headaches. Negative for numbness.     Physical Exam Updated Vital Signs BP 124/78 (BP Location: Right Arm)   Pulse 66    Temp 98.3 F (36.8 C) (Oral)   Resp 16   Ht 5\' 3"  (1.6 m)   Wt 68 kg   SpO2 99%   BMI 26.57 kg/m   Physical Exam Vitals signs and nursing note reviewed.  Constitutional:      General: She is not in acute distress.    Appearance: She is not ill-appearing.  HENT:     Head: Normocephalic and atraumatic.     Comments: No sinus or temporal tenderness.    Right Ear: Tympanic membrane and external ear normal.     Left Ear: Tympanic membrane and external ear normal.     Nose: Nose normal.     Mouth/Throat:     Mouth: Mucous membranes are moist.     Pharynx: Oropharynx is clear.  Eyes:     General: No scleral icterus.       Right eye: No discharge.        Left eye: No discharge.     Extraocular Movements: Extraocular movements intact.     Conjunctiva/sclera: Conjunctivae normal.     Pupils: Pupils are equal, round, and reactive to light.  Neck:     Musculoskeletal: Normal range of motion.     Vascular: No JVD.  Cardiovascular:     Rate and Rhythm: Normal rate and regular rhythm.     Pulses: Normal pulses.          Radial pulses are 2+ on the right side and 2+ on the left side.     Heart sounds: Normal heart sounds.  Pulmonary:     Comments: Lungs clear to auscultation in all fields. Symmetric chest rise. No wheezing, rales, or rhonchi. Abdominal:     Comments: Abdomen is soft, non-distended, and non-tender in all quadrants. No rigidity, no guarding. No peritoneal signs.  Musculoskeletal: Normal range of motion.  Skin:    General: Skin is warm and dry.     Capillary Refill: Capillary refill takes less than 2 seconds.  Neurological:     Mental Status: She is oriented to person, place, and time.     GCS: GCS eye subscore is 4.  GCS verbal subscore is 5. GCS motor subscore is 6.     Comments: Mental Status:  Alert, oriented, thought content appropriate, able to give a coherent history. Speech fluent without evidence of aphasia. Able to follow 2 step commands without difficulty.   Cranial Nerves:  II:  Peripheral visual fields grossly normal, pupils equal, round, reactive to light III,IV, VI: ptosis not present, extra-ocular motions intact bilaterally  V,VII: smile symmetric, facial light touch sensation equal VIII: hearing grossly normal to voice  X: uvula elevates symmetrically  XI: bilateral shoulder shrug symmetric and strong XII: midline tongue extension without fassiculations Motor:  Normal tone. 5/5 in upper and lower extremities bilaterally including strong and equal grip strength and dorsiflexion/plantar flexion Sensory: Pinprick and light touch normal in all extremities.  Deep Tendon Reflexes: 2+ and symmetric in the biceps and patella Cerebellar: normal finger-to-nose with bilateral upper extremities Gait: normal gait and balance CV: distal pulses palpable throughout   Psychiatric:        Behavior: Behavior normal.      ED Treatments / Results  Labs (all labs ordered are listed, but only abnormal results are displayed) Labs Reviewed  BASIC METABOLIC PANEL - Abnormal; Notable for the following components:      Result Value   Potassium 3.3 (*)    All other components within normal limits  CBC WITH DIFFERENTIAL/PLATELET - Abnormal; Notable for the following components:   Eosinophils Absolute 0.8 (*)    All other components within normal limits  URINALYSIS, ROUTINE W REFLEX MICROSCOPIC - Abnormal; Notable for the following components:   Hgb urine dipstick SMALL (*)    Leukocytes,Ua TRACE (*)    All other components within normal limits  URINALYSIS, MICROSCOPIC (REFLEX) - Abnormal; Notable for the following components:   Bacteria, UA RARE (*)    All other components within normal limits  CBG MONITORING, ED    EKG None  Radiology No results found.  Procedures Procedures (including critical care time)  Medications Ordered in ED Medications  sodium chloride 0.9 % bolus 1,000 mL (0 mLs Intravenous Stopped 07/13/18 1544)  dexamethasone  (DECADRON) injection 10 mg (10 mg Intravenous Given 07/13/18 1448)  diphenhydrAMINE (BENADRYL) injection 25 mg (25 mg Intravenous Given 07/13/18 1446)  prochlorperazine (COMPAZINE) injection 10 mg (10 mg Intravenous Given 07/13/18 1451)  ketorolac (TORADOL) 15 MG/ML injection 15 mg (15 mg Intramuscular Given 07/13/18 1635)     Initial Impression / Assessment and Plan / ED Course  I have reviewed the triage vital signs and the nursing notes.  Pertinent labs & imaging results that were available during my care of the patient were reviewed by me and considered in my medical decision making (see chart for details).  53 yo female presents with headache and weakness x1 week. Pt HA treated with IV decadron, compazine, benadryl, IM toradol and improved while in ED.  Presentation is like pts typical HA and non concerning for [facility], ICH, Meningitis, or temporal arteritis. Pt is afebrile with no focal neuro deficits, nuchal rigidity, or change in vision. CBC and BMP unremarkable. UA without signs of obvious infection. Pt is to follow up with PCP to discuss prophylactic medication. Pt verbalizes understanding and is agreeable with plan to dc.   This note was prepared using Dragon voice recognition software and may include unintentional dictation errors due to the inherent limitations of voice recognition software.  Final Clinical Impressions(s) / ED Diagnoses   Final diagnoses:  Acute nonintractable headache, unspecified headache type  ED Discharge Orders    None       Kathyrn Lass 07/13/18 1714    Maia Plan, MD 07/13/18 [phone]

## 2018-07-19 ENCOUNTER — Other Ambulatory Visit: Payer: Self-pay

## 2018-07-19 ENCOUNTER — Emergency Department (HOSPITAL_COMMUNITY)
Admission: EM | Admit: 2018-07-19 | Discharge: 2018-07-19 | Disposition: A | Discharge status: Home / Self Care | Payer: PRIVATE HEALTH INSURANCE | Attending: Emergency Medicine | Admitting: Emergency Medicine

## 2018-07-19 ENCOUNTER — Emergency Department (HOSPITAL_COMMUNITY): Payer: PRIVATE HEALTH INSURANCE

## 2018-07-19 DIAGNOSIS — I10 Essential (primary) hypertension: Secondary | ICD-10-CM | POA: Diagnosis not present

## 2018-07-19 DIAGNOSIS — N3001 Acute cystitis with hematuria: Secondary | ICD-10-CM

## 2018-07-19 DIAGNOSIS — Z7982 Long term (current) use of aspirin: Secondary | ICD-10-CM | POA: Insufficient documentation

## 2018-07-19 DIAGNOSIS — Z7984 Long term (current) use of oral hypoglycemic drugs: Secondary | ICD-10-CM | POA: Insufficient documentation

## 2018-07-19 DIAGNOSIS — E038 Other specified hypothyroidism: Secondary | ICD-10-CM | POA: Insufficient documentation

## 2018-07-19 DIAGNOSIS — Z96652 Presence of left artificial knee joint: Secondary | ICD-10-CM | POA: Insufficient documentation

## 2018-07-19 DIAGNOSIS — Z79899 Other long term (current) drug therapy: Secondary | ICD-10-CM | POA: Diagnosis not present

## 2018-07-19 DIAGNOSIS — E119 Type 2 diabetes mellitus without complications: Secondary | ICD-10-CM | POA: Insufficient documentation

## 2018-07-19 DIAGNOSIS — R51 Headache: Secondary | ICD-10-CM | POA: Diagnosis present

## 2018-07-19 LAB — COMPREHENSIVE METABOLIC PANEL
ALT: 17 U/L (ref 0–44)
AST: 19 U/L (ref 15–41)
Albumin: 3.4 g/dL — ABNORMAL LOW (ref 3.5–5.0)
Alkaline Phosphatase: 90 U/L (ref 38–126)
Anion gap: 7 (ref 5–15)
BUN: 15 mg/dL (ref 6–20)
CO2: 25 mmol/L (ref 22–32)
Calcium: 9 mg/dL (ref 8.9–10.3)
Chloride: 108 mmol/L (ref 98–111)
Creatinine, Ser: 0.71 mg/dL (ref 0.44–1.00)
GFR calc Af Amer: >60 mL/min (ref >60)
GFR calc non Af Amer: >60 mL/min (ref >60)
Glucose, Bld: 82 mg/dL (ref 70–99)
Potassium: 3.6 mmol/L (ref 3.5–5.1)
Sodium: 140 mmol/L (ref 135–145)
Total Bilirubin: 0.4 mg/dL (ref 0.3–1.2)
Total Protein: 6.1 g/dL — ABNORMAL LOW (ref 6.5–8.1)

## 2018-07-19 LAB — CBC WITH DIFFERENTIAL/PLATELET
Abs Immature Granulocytes: 0.06 10*3/uL (ref 0.00–0.07)
Basophils Absolute: 0.1 10*3/uL (ref 0.0–0.1)
Basophils Relative: 1 %
Eosinophils Absolute: 1.1 10*3/uL — ABNORMAL HIGH (ref 0.0–0.5)
Eosinophils Relative: 10 %
HCT: 40.2 % (ref 36.0–46.0)
Hemoglobin: 12.8 g/dL (ref 12.0–15.0)
Immature Granulocytes: 1 %
Lymphocytes Relative: 25 %
Lymphs Abs: 2.8 10*3/uL (ref 0.7–4.0)
MCH: 27.8 pg (ref 26.0–34.0)
MCHC: 31.8 g/dL (ref 30.0–36.0)
MCV: 87.4 fL (ref 80.0–100.0)
Monocytes Absolute: 0.7 10*3/uL (ref 0.1–1.0)
Monocytes Relative: 7 %
Neutro Abs: 6.6 10*3/uL (ref 1.7–7.7)
Neutrophils Relative %: 56 %
Platelets: 265 10*3/uL (ref 150–400)
RBC: 4.6 MIL/uL (ref 3.87–5.11)
RDW: 12.7 % (ref 11.5–15.5)
WBC: 11.4 10*3/uL — ABNORMAL HIGH (ref 4.0–10.5)
nRBC: 0 % (ref 0.0–0.2)

## 2018-07-19 LAB — URINALYSIS, ROUTINE W REFLEX MICROSCOPIC
Bilirubin Urine: NEGATIVE
Glucose, UA: NEGATIVE mg/dL
Ketones, ur: NEGATIVE mg/dL
Nitrite: NEGATIVE
Protein, ur: NEGATIVE mg/dL
RBC / HPF: >50 RBC/hpf — ABNORMAL HIGH (ref 0–5)
Specific Gravity, Urine: 1.006 (ref 1.005–1.030)
pH: 7 (ref 5.0–8.0)

## 2018-07-19 MED ORDER — SODIUM CHLORIDE 0.9 % IV BOLUS
1000.0000 mL | Freq: Once | INTRAVENOUS | Status: AC
  Administered 2018-07-19: 1000 mL via INTRAVENOUS

## 2018-07-19 MED ORDER — BUTALBITAL-APAP-CAFFEINE 50-325-40 MG PO TABS: 1.0000 | ORAL_TABLET | Freq: Four times a day (QID) | ORAL | 0 refills | Status: DC | PRN

## 2018-07-19 MED ORDER — SODIUM CHLORIDE 0.9% FLUSH: 3.0000 mL | Freq: Once | INTRAVENOUS | Status: DC

## 2018-07-19 MED ORDER — CEPHALEXIN 500 MG PO CAPS: ORAL_CAPSULE | ORAL | 0 refills | Status: DC

## 2018-07-19 NOTE — ED Triage Notes (Signed)
Pt c/o HA, body aches and fatigue. Was tested for Covid x 2 days ago, but does not have the results yet. Pt states she is here because she is unable to sleep.

## 2018-07-19 NOTE — ED Notes (Signed)
Negative COVID

## 2018-07-19 NOTE — ED Notes (Addendum)
Pt transported to CT ?

## 2018-07-19 NOTE — Discharge Instructions (Addendum)
It appears that you likely have a urinary tract infection.  We will treat you with an antibiotic. May be the cause of your vague viral-like symptoms of body aches, headache and fatigue.  Please take the full prescription of antibiotics.  Follow closely with your primary care physician.  The remainder of your work-up shows no emergent or acute abnormalities.  If you do not currently have a primary care physician there is an 800-number in the discharge paperwork you may call to help use get set up for a primary care doctor.

## 2018-07-19 NOTE — ED Provider Notes (Signed)
[facility] Provider Note   CSN: [id number] Arrival date & time: 07/19/18  0347     History   Chief Complaint Chief Complaint  Patient presents with   Headache    HPI Cynthia Modenanita Pilkenton is a 53 y.o. female with a past medical history of anxiety thyroid disease, diet-controlled diabetes (last A1c 5.5).  Patient presents with complaint of vague viral-like symptoms of daily global headache, body aches, fatigue.  She had previous had a bit of a sore throat which resolved in a very mild cough.  She has not had fevers.  She is very concerned about coronavirus.  She states that she works in [employer] and 1 of the supervisors has had multiple exposures and has been symptomatic but has tested negative several times for COVID.  She had a coronavirus test at [facility] which I am able to view on EMR and was negative.  Denies tick bites recent foreign travel, nausea, vomiting, diarrhea.  His urinary symptoms.  Regarding her headache she denies severe neck stiffness, photophobia, phonophobia, sudden onset with thunderclap severity.  Her mother did have ruptured aneurysm however it was thought to be due to heavy smoking.  She denies rash.  She is on Topamax for neurologic disorder.  She has been taking Tylenol with intermittent relief of her symptoms.     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      Past Surgical History:  Procedure Laterality Date   ABDOMINAL SURGERY     APPENDECTOMY     BLADDER SURGERY     bladder tumor removed- 1997,bladder pacemaker - not active at the present time    BOWEL RESECTION     CARDIAC CATHETERIZATION     done for stress related event[place] told that every thing was ok, punctured femoral artery- treated /w Plasma    CERVICAL ABLATION     CHOLECYSTECTOMY     EXCISIONAL TOTAL KNEE ARTHROPLASTY Left 12/09/2013   Procedure: POLYEXCHANGE WITH SYNOVECTOMY;  Surgeon: Dannielle HuhSteve Lucey, MD;  Location: [facility] OR;  Service: Orthopedics;  Laterality: Left;   GASTRIC BYPASS     HERNIA REPAIR     ILEOSTOMY REVISION     REVISION TOTAL KNEE ARTHROPLASTY Left 12/09/2013   dr Sherlean Footlucey   TONSILLECTOMY     [facility]      OB History   No obstetric history on file.      Home Medications    Prior to Admission medications   Medication Sig Start Date End Date Taking? Authorizing Provider  albuterol (PROVENTIL HFA;VENTOLIN HFA) 108 (90 BASE) MCG/ACT inhaler Inhale 1 puff into the lungs every 6 (six) hours as needed for wheezing.     [provider]  aspirin EC 325 MG tablet Take  1 tablet (325 mg total) by mouth daily. 12/10/13   Altamese CabalJones, Maurice, PA-C  busPIRone (BUSPAR) 10 MG tablet Take 10 mg by mouth 3 (three) times daily.    [provider]  calcium carbonate (OS-CAL) 600 MG TABS tablet Take 600 mg by mouth daily with breakfast.    [provider]  Cholecalciferol (VITAMIN D) 2000 UNITS CAPS Take 1 capsule by mouth daily.     [provider]  clonazePAM (KLONOPIN) 1 MG tablet Take 0.5 mg by mouth every 8 (eight) hours.     [provider]  cyclobenzaprine (FLEXERIL) 5 MG tablet Take 1 tablet (5 mg total) by mouth 3 (three) times daily as needed. 07/30/17   Charlynne PanderYao, David Hsienta, MD  DULoxetine (CYMBALTA) 60 MG capsule Take 60 mg by mouth daily before breakfast.      [provider]  fluticasone (FLONASE) 50 MCG/ACT nasal spray Place 2 sprays into the nose daily.  06/06/12   [provider]  gabapentin (NEURONTIN) 300 MG capsule Take 300 mg by mouth See admin instructions. Take 300mg  in the morning and 600mg  at bedtime    [provider]  haloperidol (HALDOL) 0.5 MG tablet Take 0.5 mg by mouth every 8 (eight) hours as needed for agitation.  04/27/12   [provider]  HYDROcodone-acetaminophen (NORCO) 10-325 MG per tablet Take 1-2 tablets by mouth every 4 (four) hours as needed (breakthrough pain). 12/10/13   Altamese CabalJones, Maurice, PA-C  lamoTRIgine (LAMICTAL) 200 MG tablet Take 400 mg by mouth at bedtime.    [provider]  levothyroxine (SYNTHROID, LEVOTHROID) 50 MCG tablet Take 50 mcg by mouth daily before breakfast.    [provider]  lisinopril (PRINIVIL,ZESTRIL) 40 MG tablet Take 40 mg by mouth daily.    [provider]  metFORMIN (GLUCOPHAGE-XR) 500 MG 24 hr tablet Take 500 mg by mouth every evening.  07/29/13   [provider]  methocarbamol (ROBAXIN) 500 MG tablet Take 1-2 tablets (500-1,000 mg total) by mouth 4 (four) times daily. 12/10/13   Altamese CabalJones, Maurice, PA-C  montelukast (SINGULAIR) 10 MG tablet Take 10 mg by mouth daily before breakfast.     [provider]  morphine (MSIR) 15 MG tablet Take 1 tablet (15 mg total) by mouth every 4 (four) hours as needed for severe pain. 12/10/13   Altamese CabalJones, Maurice, PA-C  Multiple Vitamin (MULTIVITAMIN WITH MINERALS) TABS tablet Take 1 tablet by mouth daily.    [provider]  nitrofurantoin, macrocrystal-monohydrate, (MACROBID) 100 MG capsule Take 1 capsule (100 mg total) by mouth 2 (two) times daily. X 7 days 12/07/13   Gilda CreasePollina, Christopher J, MD  omeprazole (PRILOSEC) 20 MG capsule Take 20 mg by mouth 2 (two) times daily before a meal.     [provider]  Oxcarbazepine (TRILEPTAL) 300 MG tablet Take 150 mg by mouth 2 (two)  times daily.    [provider]  potassium chloride SA (K-DUR,KLOR-CON) 20 MEQ tablet Take 1 tablet (20 mEq total) by mouth daily. 11/08/17   Roxy HorsemanBrowning, Robert, PA-C  predniSONE (STERAPRED UNI-PAK) 10 MG tablet Take by mouth daily. As directed on pack - 6 day taper pack 12/07/13   Pollina, Canary Brimhristopher J, MD  traZODone (DESYREL) 150 MG tablet Take 75 mg by mouth at bedtime.     [provider]  vancomycin (VANCOCIN) 125 MG capsule Take 1 capsule (125 mg total) by mouth 4 (four) times daily. 11/08/17   Roxy HorsemanBrowning, Robert, PA-C  verapamil (CALAN-SR) 120 MG CR  tablet Take 120 mg by mouth at bedtime.  07/29/13 07/29/14  [provider]    Family History No family history on file.  Social History Social History   Tobacco Use   Smoking status: Never Smoker   Smokeless tobacco: Never Used  Substance Use Topics   Alcohol use: No   Drug use: No     Allergies   Nitrofurantoin, Metoclopramide, and Tape   Review of Systems Review of Systems  Ten systems reviewed and are negative for acute change, except as noted in the HPI.   Physical Exam Updated Vital Signs BP 123/82    Pulse 64    Temp 97.8 F (36.6 C) (Oral)    Resp 15    Ht 5\' 3"  (1.6 m)    Wt 68 kg    SpO2 100%    BMI 26.57 kg/m   Physical Exam Vitals signs and nursing note reviewed.  Constitutional:      General: She is not in acute distress.    Appearance: She is well-developed. She is not diaphoretic.  HENT:     Head: Normocephalic and atraumatic.  Eyes:     General: No scleral icterus.    Conjunctiva/sclera: Conjunctivae normal.     Pupils: Pupils are equal, round, and reactive to light.     Comments: No horizontal, vertical or rotational nystagmus  Neck:     Musculoskeletal: Normal range of motion and neck supple.     Comments: Full active and passive ROM without pain No midline or paraspinal tenderness No nuchal rigidity or meningeal signs Cardiovascular:     Rate and Rhythm: Normal rate  and regular rhythm.  Pulmonary:     Effort: Pulmonary effort is normal. No respiratory distress.     Breath sounds: Normal breath sounds. No wheezing or rales.  Abdominal:     General: Bowel sounds are normal.     Palpations: Abdomen is soft.     Tenderness: There is no abdominal tenderness. There is no guarding or rebound.  Musculoskeletal: Normal range of motion.  Lymphadenopathy:     Cervical: No cervical adenopathy.  Skin:    General: Skin is warm and dry.     Findings: No rash.  Neurological:     Mental Status: She is alert and oriented to person, place, and time.     Cranial Nerves: No cranial nerve deficit.     Motor: No abnormal muscle tone.     Coordination: Coordination normal.     Deep Tendon Reflexes: Reflexes are normal and symmetric.     Comments: Mental Status:  Alert, oriented, thought content appropriate. Speech fluent without evidence of aphasia. Able to follow 2 step commands without difficulty.  Cranial Nerves:  II:  Peripheral visual fields grossly normal, pupils equal, round, reactive to light III,IV, VI: ptosis not present, extra-ocular motions intact bilaterally  V,VII: smile symmetric, facial light touch sensation equal VIII: hearing grossly normal bilaterally  IX,X: midline uvula rise  XI: bilateral shoulder shrug equal and strong XII: midline tongue extension  Motor:  5/5 in upper and lower extremities bilaterally including strong and equal grip strength and dorsiflexion/plantar flexion Sensory: Pinprick and light touch normal in all extremities.  Deep Tendon Reflexes: 2+ and symmetric  Cerebellar: normal finger-to-nose with bilateral upper extremities Gait: normal gait and balance CV: distal pulses palpable throughout   Psychiatric:        Behavior: Behavior normal.        Thought Content: Thought content normal.  Judgment: Judgment normal.      ED Treatments / Results  Labs (all labs ordered are listed, but only abnormal results are  displayed) Labs Reviewed  COMPREHENSIVE METABOLIC PANEL - Abnormal; Notable for the following components:      Result Value   Total Protein 6.1 (*)    Albumin 3.4 (*)    All other components within normal limits  CBC WITH DIFFERENTIAL/PLATELET - Abnormal; Notable for the following components:   WBC 11.4 (*)    Eosinophils Absolute 1.1 (*)    All other components within normal limits  URINALYSIS, ROUTINE W REFLEX MICROSCOPIC    EKG    Radiology No results found.  Procedures Procedures (including critical care time)  Medications Ordered in ED Medications  sodium chloride flush (NS) 0.9 % injection 3 mL (3 mLs Intravenous Not Given 07/19/18 0701)  sodium chloride 0.9 % bolus 1,000 mL (1,000 mLs Intravenous New Bag/Given 07/19/18 [id number])     Initial Impression / Assessment and Plan / ED Course  I have reviewed the triage vital signs and the nursing notes.  Pertinent labs & imaging results that were available during my care of the patient were reviewed by me and considered in my medical decision making (see chart for details).  Clinical Course as of Jul 20 1521  Thu Jul 19, 2018  0845 Sodium: 140 [AH]  0845 WBC(!): 11.4 [AH]  0845 Patient with eosinophilic leukocytosis   Eosinophils Absolute(!): 1.1 [AH]    Clinical Course User Index [AH] Arthor CaptainHarris, Kolin Erdahl, PA-C       MV:HQIONGECC:fatigue, headache VS:  Vitals:   07/19/18 0745 07/19/18 0800 07/19/18 0920 07/19/18 0939  BP: 123/82 114/84 (!) 148/75   Pulse: 64 (!) 58 (!) 59 61  Resp:  16    Temp:      TempSrc:      SpO2: 100% 99% 100% 100%  Weight:      Height:       XB:[id number]:History is gathered by patient  and EMR. DDX:sxs vague. Recent - COVID-19 Emergent considerations for headache include subarachnoid hemorrhage, meningitis, temporal arteritis, glaucoma, cerebral ischemia, carotid/vertebral dissection, intracranial tumor, Venous sinus thrombosis, carbon monoxide poisoning, acute or chronic subdural hemorrhage.  Other  considerations include: Migraine, Cluster headache, Hypertension, Caffeine, alcohol, or drug withdrawal, Pseudotumor cerebri, Arteriovenous malformation, Head injury, Neurocysticercosis, Post-lumbar puncture, Preeclampsia, Tension headache, Sinusitis, Cervical arthritis, Refractive error causing strain, Dental abscess, Otitis media, Temporomandibular joint syndrome, Depression, Somatoform disorder (eg, somatization) Trigeminal neuralgia, Glossopharyngeal neuralgia. EOsinophilic leukocytosis differential includes helminth infection, other parasitic infection, chronic eosinophilic allergies and asthma Labs: I reviewed the labs which show likely UTI, mild hypoproteinemia, eosinophilic leukocytosis Imaging: I personally reviewed the images (CT head) which show(s) no acute abnormalities EKG:N/A MDM: With vague viral symptoms.  Because mild, no concern for subarachnoid hemorrhage, meningitis, no evidence of mass, no neurologic deficits.  Patient does appear to have a urinary tract infection will be treated with antibiotics.  I have low suspicion for other emergent cause of her symptoms. Patient disposition: Discharge Patient condition: Good. The patient appears reasonably screened and/or stabilized for discharge and I doubt any other medical condition or other [facility] requiring further screening, evaluation, or treatment in the ED at this time prior to discharge. I have discussed lab and/or imaging findings with the patient and answered all questions/concerns to the best of my ability. I have discussed return precautions and OP follow up.      Final Clinical Impressions(s) / ED Diagnoses   Final diagnoses:  Acute cystitis with hematuria    ED Discharge Orders    None       Arthor Captain, PA-C 07/21/18 1533    Cathren Laine, MD 07/23/18 [phone]

## 2018-09-06 ENCOUNTER — Emergency Department (HOSPITAL_BASED_OUTPATIENT_CLINIC_OR_DEPARTMENT_OTHER)
Admission: EM | Admit: 2018-09-06 | Discharge: 2018-09-06 | Disposition: A | Discharge status: Home / Self Care | Payer: PRIVATE HEALTH INSURANCE | Attending: Emergency Medicine | Admitting: Emergency Medicine

## 2018-09-06 ENCOUNTER — Emergency Department (HOSPITAL_BASED_OUTPATIENT_CLINIC_OR_DEPARTMENT_OTHER): Payer: PRIVATE HEALTH INSURANCE

## 2018-09-06 ENCOUNTER — Encounter (HOSPITAL_BASED_OUTPATIENT_CLINIC_OR_DEPARTMENT_OTHER): Payer: Self-pay

## 2018-09-06 ENCOUNTER — Other Ambulatory Visit: Payer: Self-pay

## 2018-09-06 DIAGNOSIS — Z79899 Other long term (current) drug therapy: Secondary | ICD-10-CM | POA: Insufficient documentation

## 2018-09-06 DIAGNOSIS — J45909 Unspecified asthma, uncomplicated: Secondary | ICD-10-CM | POA: Insufficient documentation

## 2018-09-06 DIAGNOSIS — E039 Hypothyroidism, unspecified: Secondary | ICD-10-CM | POA: Diagnosis not present

## 2018-09-06 DIAGNOSIS — I10 Essential (primary) hypertension: Secondary | ICD-10-CM | POA: Diagnosis not present

## 2018-09-06 DIAGNOSIS — E119 Type 2 diabetes mellitus without complications: Secondary | ICD-10-CM | POA: Diagnosis not present

## 2018-09-06 DIAGNOSIS — M545 Low back pain, unspecified: Secondary | ICD-10-CM

## 2018-09-06 DIAGNOSIS — Z96652 Presence of left artificial knee joint: Secondary | ICD-10-CM | POA: Diagnosis not present

## 2018-09-06 MED ORDER — GABAPENTIN 100 MG PO CAPS: 200.0000 mg | ORAL_CAPSULE | Freq: Every day | ORAL | 0 refills | Status: DC | PRN

## 2018-09-06 MED ORDER — CYCLOBENZAPRINE HCL 5 MG PO TABS: 5.0000 mg | ORAL_TABLET | Freq: Three times a day (TID) | ORAL | 0 refills | Status: DC | PRN

## 2018-09-06 MED ORDER — GABAPENTIN 300 MG PO CAPS
400.0000 mg | ORAL_CAPSULE | Freq: Once | ORAL | Status: AC
  Administered 2018-09-06: 16:00:00 400 mg via ORAL
  Filled 2018-09-06: qty 1

## 2018-09-06 MED ORDER — ACETAMINOPHEN 500 MG PO TABS
1000.0000 mg | ORAL_TABLET | Freq: Once | ORAL | Status: AC
  Administered 2018-09-06: 1000 mg via ORAL
  Filled 2018-09-06: qty 2

## 2018-09-06 NOTE — ED Provider Notes (Signed)
[facility] EMERGENCY DEPARTMENT Provider Note   CSN: [id number] Arrival date & time: 09/06/18  1328    History   Chief Complaint Chief Complaint  Patient presents with  . Back Pain    HPI Cynthia Modenanita Craun is a 53 y.o. female.     Patient states she was having lower back pain last night, that was worse this morning.   the pain is in the lumbar region and extending bilaterally.  She has not had any pain, paresthesia, weakness, numbness of the legs, saddle anesthesia, incontinence.  She has not been exerting herself more than usual lately, she did not have any trauma or falls, does not remember hurting her back.  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  She has a history of ruptured disks and spinal stenosis.  Patient states it is better when she lies down or sitting in chair.  She describes it as an intense constant ache.  Patient takes 400 mg twice a day of gabapentin for neuropathy and Tylenol as needed.  She has a history of foot drop for which she saw a neurologist, but does not know what caused it.  Patient has a history of knee replacement, hernia repair that resulted in nicked bowel requiring an ileostomy and subsequent surgeries.  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     Past Medical History:  Diagnosis Date  . Anxiety   . Arthritis    L knee   . Asthma    rare exposure to smoke or other irritatnt   . Back pain   . Clostridium difficile diarrhea   . Depression   . Diabetes mellitus without complication (HCC)    in combination with hyperinsulinnemia   . GERD (gastroesophageal reflux disease)   . Hyperinsulinemia   . Hypertension   . Hypothyroidism   . Neuromuscular disorder (HCC)    tourette syndrome, followed by neurology,neuropathy   . Thyroid disease     Patient Active Problem List   Diagnosis Date Noted  . 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 ABDOMINAL SURGERY    . APPENDECTOMY    . BLADDER SURGERY     bladder tumor removed- 1997,bladder pacemaker - not active at the present time   . BOWEL RESECTION    . CARDIAC CATHETERIZATION     [place] stress related [place] told that every thing was ok, punctured femoral artery- treated /w Plasma   . CERVICAL ABLATION    . CHOLECYSTECTOMY    . EXCISIONAL TOTAL KNEE ARTHROPLASTY Left 12/09/2013   Procedure: POLYEXCHANGE WITH SYNOVECTOMY;  Surgeon: Dannielle HuhSteve Lucey, MD;  Location: [facility] OR;  Service: Orthopedics;  Laterality: Left;  Marland Kitchen. GASTRIC BYPASS    . HERNIA REPAIR    . ILEOSTOMY REVISION    . REVISION TOTAL KNEE ARTHROPLASTY Left 12/09/2013   dr Sherlean Footlucey  . TONSILLECTOMY    . TOTAL KNEE [facility]THR[facility]LASTY Left 2014   [facility]      OB History   No obstetric history on file.      Home Medications    Prior to Admission medications   Medication Sig Start Date End Date Taking? Authorizing Provider  aspirin EC 325 MG tablet Take 1 tablet (325 mg total) by mouth daily. Patient not taking: Reported on 07/19/2018 12/10/13   Altamese CabalJones, Maurice, PA-C  busPIRone (BUSPAR) 30 MG tablet Take 30 mg by mouth 2 (two) times a day. 05/18/18   [provider]  butalbital-acetaminophen-caffeine (FIORICET) 50-325-40 MG tablet Take 1-2 tablets by mouth every 6 (six) hours as needed for headache or migraine (max of 6 tabs /day). 07/19/18 07/19/19  Arthor CaptainHarris, Abigail, PA-C  cephALEXin (KEFLEX) 500 MG capsule 2 caps po bid x 7 days 07/19/18   Arthor CaptainHarris, Abigail, PA-C  cholecalciferol (VITAMIN D3) 25 MCG (1000 UT) tablet Take 1,000 Units by mouth daily.     [provider]  clonazePAM (KLONOPIN) 1 MG tablet Take 1 mg by mouth daily as needed for anxiety.     [provider]  cyclobenzaprine (FLEXERIL) 5 MG tablet Take 1 tablet (5 mg total) by mouth 3 (three) times daily as needed for muscle spasms. 09/06/18   Charlynne PanderYao, David Hsienta, MD  DULoxetine  (CYMBALTA) 60 MG capsule Take 60 mg by mouth daily before breakfast.     [provider]  ferrous sulfate 325 (65 FE) MG tablet Take 325 mg by mouth daily with breakfast.    [provider]  fluticasone (FLONASE) 50 MCG/ACT nasal spray Place 2 sprays into the nose daily.  06/06/12   [provider]  gabapentin (NEURONTIN) 100 MG capsule Take 2 capsules (200 mg total) by mouth daily as needed. 09/06/18   Charlynne PanderYao, David Hsienta, MD  gabapentin (NEURONTIN) 400 MG capsule Take 400 mg by mouth 2 (two) times a day. 06/13/18   [provider]  HYDROcodone-acetaminophen (NORCO) 10-325 MG per tablet Take 1-2 tablets by mouth every 4 (four) hours as needed (breakthrough pain). Patient not taking: Reported on 07/19/2018 12/10/13   Altamese CabalJones, Maurice, PA-C  lamoTRIgine (LAMICTAL) 200 MG tablet Take 200 mg by mouth at bedtime.     [provider]  methocarbamol (ROBAXIN) 500 MG tablet Take 1-2 tablets (500-1,000 mg total) by mouth 4 (four) times daily. Patient not taking: Reported on 07/19/2018 12/10/13   Altamese CabalJones, Maurice, PA-C  methylphenidate (RITALIN) 10 MG tablet Take 10 mg by mouth daily. 05/04/18   [provider]  morphine (MSIR) 15 MG tablet Take 1 tablet (15 mg total) by mouth every 4 (four) hours as needed for severe pain. Patient not taking: Reported on 07/19/2018 12/10/13   Altamese CabalJones, Maurice, PA-C  omeprazole (PRILOSEC) 20 MG capsule Take 20 mg by mouth 2 (two) times daily before a meal.    [provider]  potassium chloride SA (K-DUR,KLOR-CON) 20 MEQ tablet Take 1 tablet (20 mEq total) by mouth daily. Patient not taking: Reported on 07/19/2018 11/08/17   Roxy HorsemanBrowning, Robert, PA-C  topiramate (TOPAMAX) 100 MG tablet Take 100 mg by mouth 2 (two) times a day. 06/22/16   [provider]    Family History No family history on file.  Social History Social History   Tobacco Use  . Smoking status: Never Smoker  . Smokeless tobacco: Never Used  Substance  Use Topics  . Alcohol use: No  . Drug use: No     Allergies   Nitrofurantoin, Metoclopramide, and Tape   Review of Systems Review of Systems  Constitutional: Negative for chills and fever.  HENT: Negative for sneezing and sore throat.   Eyes: Negative for redness.  Respiratory: Negative for cough, choking, chest tightness and shortness of breath.   Cardiovascular: Negative for chest pain and leg swelling.  Gastrointestinal: Negative for abdominal distention, abdominal pain, constipation, diarrhea, nausea and vomiting.  Genitourinary: Negative for difficulty urinating and dysuria.  Musculoskeletal: Positive  for back pain.  Skin: Negative for rash.  Neurological: Negative for syncope, light-headedness and headaches.     Physical Exam Updated Vital Signs BP 126/75 (BP Location: Right Arm)   Pulse (!) 58   Temp 98.2 F (36.8 C) (Oral)   Resp 16   Ht 5\' 3"  (1.6 m)   Wt 70.3 kg   SpO2 100%   BMI 27.46 kg/m   Physical Exam Vitals signs reviewed.  Constitutional:      General: She is not in acute distress.    Appearance: Normal appearance. She is not ill-appearing.  HENT:     Head: Normocephalic and atraumatic.     Nose: Nose normal. No congestion or rhinorrhea.     Mouth/Throat:     Mouth: Mucous membranes are moist.     Pharynx: Oropharynx is clear. No oropharyngeal exudate.  Eyes:     Extraocular Movements: Extraocular movements intact.     Conjunctiva/sclera: Conjunctivae normal.     Pupils: Pupils are equal, round, and reactive to light.  Neck:     Musculoskeletal: Normal range of motion. No neck rigidity or muscular tenderness.  Cardiovascular:     Rate and Rhythm: Normal rate and regular rhythm.     Pulses: Normal pulses.     Heart sounds: Normal heart sounds. No murmur. No friction rub.  Pulmonary:     Effort: Pulmonary effort is normal. No respiratory distress.     Breath sounds: Normal breath sounds. No wheezing or rhonchi.  Abdominal:     General:  Abdomen is flat. There is no distension.     Palpations: Abdomen is soft.     Tenderness: There is no abdominal tenderness.  Musculoskeletal:        General: Tenderness present. No deformity or signs of injury.     Right lower leg: No edema.     Left lower leg: No edema.     Comments: Mild tenderness to palpation of the paraspinal area in the lumbar region.  Final range of motion is slightly limited due to pain.  Normal gait.  Lymphadenopathy:     Cervical: No cervical adenopathy.  Skin:    General: Skin is dry.  Neurological:     General: No focal deficit present.     Mental Status: She is alert and oriented to person, place, and time.     Cranial Nerves: No cranial nerve deficit.     Gait: Gait normal.  Psychiatric:        Mood and Affect: Mood normal.        Behavior: Behavior normal.      ED Treatments / Results  Labs (all labs ordered are listed, but only abnormal results are displayed) Labs Reviewed - No data to display  EKG None  Radiology Dg Lumbar Spine Complete  Result Date: 09/06/2018 CLINICAL DATA:  Acute low back pain for 1 day.  Initial encounter. EXAM: LUMBAR SPINE - COMPLETE 4+ VIEW COMPARISON:  07/30/2017 radiograph FINDINGS: No acute fracture or subluxation. Mild multilevel degenerative disc disease again noted. No spondylolysis or focal bony lesions noted. Stimulator device overlying the RIGHT pelvis again noted. IMPRESSION: 1. No acute abnormality 2. Mild multilevel degenerative disc disease. Electronically Signed   By: Margarette Canada M.D.   On: 09/06/2018 16:21    Procedures Procedures (including critical care time)  Medications Ordered in ED Medications  acetaminophen (TYLENOL) tablet 1,000 mg (1,000 mg Oral Given 09/06/18 1551)  gabapentin (NEURONTIN) capsule 400 mg (400 mg Oral Given 09/06/18  1551)     Initial Impression / Assessment and Plan / ED Course  I have reviewed the triage vital signs and the nursing notes.  Pertinent labs & imaging  results that were available during my care of the patient were reviewed by me and considered in my medical decision making (see chart for details).        Patient is a 53 year old female with a past medical history of degenerative disc disease who presents with a 1 day history of worsening lumbar back pain bilaterally.  The patient is experiencing any symptoms of her exam, anesthesia, incontinence, numbness.  Just having worsening pain.  Patient already takes gabapentin twice a day and Tylenol as needed for pain.  No recent traumas or increase in activity that would have caused a muscle strain.  Pain is likely due to a worsening of her previous degenerative disc disease.  Lumbar x-ray did not show any fractures.  Patient was given another dose of her gabapentin and Tylenol.  Patient was discharged home with prescription for Flexeril.  Patient advised to follow-up with her PCP or neurologist for further work-up.  Final Clinical Impressions(s) / ED Diagnoses   Final diagnoses:  Acute midline low back pain without sciatica    ED Discharge Orders         Ordered    gabapentin (NEURONTIN) 100 MG capsule  Daily PRN     09/06/18 1640    cyclobenzaprine (FLEXERIL) 5 MG tablet  3 times daily PRN     09/06/18 1640    cyclobenzaprine (FLEXERIL) 5 MG tablet  3 times daily PRN,   Status:  Discontinued     09/06/18 1640           Sandre Kittylson, Oriah Leinweber K, MD 09/07/18 1441    Charlynne PanderYao, David Hsienta, MD 09/10/18 1047

## 2018-09-06 NOTE — Discharge Instructions (Addendum)
Continue gabapentin. You may take extra 200 mg in the afternoon if you have severe pain   Take flexeril as needed for muscle spasms. Do NOT take your klonopin with it   See your doctor. Consider following up with spine doctor   Return to ER if you have worse back pain, numbness, weakness

## 2018-09-06 NOTE — ED Triage Notes (Signed)
Pt c/o mid back pain started last night-denies injury-hx of backpain-NAD-steady gait

## 2018-09-06 NOTE — ED Notes (Signed)
Patient transported to X-ray 

## 2018-10-13 ENCOUNTER — Emergency Department (HOSPITAL_COMMUNITY)
Admission: EM | Admit: 2018-10-13 | Discharge: 2018-10-13 | Disposition: A | Discharge status: Home / Self Care | Payer: PRIVATE HEALTH INSURANCE | Attending: Emergency Medicine | Admitting: Emergency Medicine

## 2018-10-13 ENCOUNTER — Other Ambulatory Visit: Payer: Self-pay

## 2018-10-13 ENCOUNTER — Emergency Department (HOSPITAL_COMMUNITY): Payer: PRIVATE HEALTH INSURANCE

## 2018-10-13 DIAGNOSIS — B373 Candidiasis of vulva and vagina: Secondary | ICD-10-CM | POA: Insufficient documentation

## 2018-10-13 DIAGNOSIS — R109 Unspecified abdominal pain: Secondary | ICD-10-CM

## 2018-10-13 DIAGNOSIS — I10 Essential (primary) hypertension: Secondary | ICD-10-CM | POA: Insufficient documentation

## 2018-10-13 DIAGNOSIS — E039 Hypothyroidism, unspecified: Secondary | ICD-10-CM | POA: Diagnosis not present

## 2018-10-13 DIAGNOSIS — N1330 Unspecified hydronephrosis: Secondary | ICD-10-CM | POA: Diagnosis not present

## 2018-10-13 DIAGNOSIS — J45909 Unspecified asthma, uncomplicated: Secondary | ICD-10-CM | POA: Insufficient documentation

## 2018-10-13 DIAGNOSIS — N200 Calculus of kidney: Secondary | ICD-10-CM | POA: Diagnosis not present

## 2018-10-13 DIAGNOSIS — Z79899 Other long term (current) drug therapy: Secondary | ICD-10-CM | POA: Diagnosis not present

## 2018-10-13 DIAGNOSIS — B379 Candidiasis, unspecified: Secondary | ICD-10-CM

## 2018-10-13 LAB — URINALYSIS, ROUTINE W REFLEX MICROSCOPIC
Bilirubin Urine: NEGATIVE
Glucose, UA: NEGATIVE mg/dL
Ketones, ur: NEGATIVE mg/dL
Nitrite: NEGATIVE
Protein, ur: 30 mg/dL — AB
RBC / HPF: >50 RBC/hpf — ABNORMAL HIGH (ref 0–5)
Specific Gravity, Urine: 1.016 (ref 1.005–1.030)
pH: 5 (ref 5.0–8.0)

## 2018-10-13 LAB — CBC WITH DIFFERENTIAL/PLATELET
Abs Immature Granulocytes: 0.06 10*3/uL (ref 0.00–0.07)
Basophils Absolute: 0.1 10*3/uL (ref 0.0–0.1)
Basophils Relative: 0 %
Eosinophils Absolute: 0.7 10*3/uL — ABNORMAL HIGH (ref 0.0–0.5)
Eosinophils Relative: 6 %
HCT: 43.2 % (ref 36.0–46.0)
Hemoglobin: 13.4 g/dL (ref 12.0–15.0)
Immature Granulocytes: 1 %
Lymphocytes Relative: 17 %
Lymphs Abs: 2 10*3/uL (ref 0.7–4.0)
MCH: 27.8 pg (ref 26.0–34.0)
MCHC: 31 g/dL (ref 30.0–36.0)
MCV: 89.6 fL (ref 80.0–100.0)
Monocytes Absolute: 0.6 10*3/uL (ref 0.1–1.0)
Monocytes Relative: 5 %
Neutro Abs: 8 10*3/uL — ABNORMAL HIGH (ref 1.7–7.7)
Neutrophils Relative %: 71 %
Platelets: 236 10*3/uL (ref 150–400)
RBC: 4.82 MIL/uL (ref 3.87–5.11)
RDW: 12.5 % (ref 11.5–15.5)
WBC: 11.5 10*3/uL — ABNORMAL HIGH (ref 4.0–10.5)
nRBC: 0 % (ref 0.0–0.2)

## 2018-10-13 LAB — BASIC METABOLIC PANEL
Anion gap: 9 (ref 5–15)
BUN: 15 mg/dL (ref 6–20)
CO2: 23 mmol/L (ref 22–32)
Calcium: 8.9 mg/dL (ref 8.9–10.3)
Chloride: 111 mmol/L (ref 98–111)
Creatinine, Ser: 0.78 mg/dL (ref 0.44–1.00)
GFR calc Af Amer: >60 mL/min (ref >60)
GFR calc non Af Amer: >60 mL/min (ref >60)
Glucose, Bld: 113 mg/dL — ABNORMAL HIGH (ref 70–99)
Potassium: 3.7 mmol/L (ref 3.5–5.1)
Sodium: 143 mmol/L (ref 135–145)

## 2018-10-13 MED ORDER — ONDANSETRON HCL 4 MG/2ML IJ SOLN
4.0000 mg | Freq: Once | INTRAMUSCULAR | Status: AC
  Administered 2018-10-13: 4 mg via INTRAVENOUS
  Filled 2018-10-13: qty 2

## 2018-10-13 MED ORDER — KETOROLAC TROMETHAMINE 30 MG/ML IJ SOLN
30.0000 mg | Freq: Once | INTRAMUSCULAR | Status: AC
  Administered 2018-10-13: 30 mg via INTRAVENOUS
  Filled 2018-10-13: qty 1

## 2018-10-13 MED ORDER — OXYCODONE-ACETAMINOPHEN 5-325 MG PO TABS: 1.0000 | ORAL_TABLET | ORAL | 0 refills | Status: DC | PRN

## 2018-10-13 MED ORDER — SODIUM CHLORIDE 0.9 % IV BOLUS
1000.0000 mL | Freq: Once | INTRAVENOUS | Status: AC
  Administered 2018-10-13: 1000 mL via INTRAVENOUS

## 2018-10-13 NOTE — ED Notes (Signed)
An After Visit Summary was printed and given to the patient. Discharge instructions given and no further questions at this time.  

## 2018-10-13 NOTE — Discharge Instructions (Addendum)
You were seen in the ED today for right sided flank pain Your CT scan did show a 7 mm kidney stone on the right side with some obstruction.  Please pick up the Flomax that you were prescribed by your PCP. I have also prescribed a short course of pain medication to take as needed.  Please continue taking the Diflucan that you were prescribed.  Follow up with [facility] - please call them on Tuesday to schedule an appointment.  Return to the ED immediately for any worsening pain, fever > 100.4, difficulty urinating.

## 2018-10-13 NOTE — ED Triage Notes (Signed)
Pt BIB EMS from home. Pt c/o R flank pain since 0700 today. Pt seen with PCP yesterday for hematuria. Pt denies hematuria today. Pt has hx of abdominal surgeries and diabetes.  133/91  CBG 93

## 2018-10-13 NOTE — ED Notes (Signed)
Pt states her baseline heart rate is 45-55 bpm.

## 2018-10-13 NOTE — ED Provider Notes (Signed)
[facility]-EMERGENCY DEPT Provider Note   CSN: [id number] Arrival date & time: 10/13/18  1033     History   Chief Complaint Chief Complaint  Patient presents with  . Flank Pain    HPI Cynthia Cohen is a 53 y.o. female with PMHx HTN, hypothyroidism, diabetes, chronic back pain who presents to the ED today complaining of gradual onset, intermittent, right flank pain that began about 1 week ago but worsened this morning.  Patient reports that she was seen at an urgent care about 1 week ago after noticing that her urine was pink in color.  She reports that she was started on Cipro at that time and the urine was sent for culture.  She was called approximately 2 days later and told that the urine did not grow anything and she should follow-up with her PCP with concern for kidney stones.  Patient saw her PCP yesterday and was prescribed Flomax for her symptoms.  She reports that she had not had a chance to pick them up yet.  When she woke up this morning she had such severe pain in her right flank prompting her to call EMS.  Per nursing staff patient was given 100 mcg of fentanyl in route.  She reports that her pain has significantly subsided.  Denies fever, chills, nausea, vomiting, dysuria, frequency, vaginal discharge, pelvic pain, any other associated symptoms.  Patient denies history of kidney stones in the past.  Surgical history includes appendectomy.       Past Medical History:  Diagnosis Date  . Anxiety   . Arthritis    L knee   . Asthma    rare exposure to smoke or other irritatnt   . Back pain   . Clostridium difficile diarrhea   . Depression   . Diabetes mellitus without complication (HCC)    in combination with hyperinsulinnemia   . GERD (gastroesophageal reflux disease)   . Hyperinsulinemia   . Hypertension   . Hypothyroidism   . Neuromuscular disorder (HCC)    tourette syndrome, followed by neurology,neuropathy   . Thyroid disease     Patient  Active Problem List   Diagnosis Date Noted  . 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 ABDOMINAL SURGERY    . APPENDECTOMY    . BLADDER SURGERY     bladder tumor removed- 1997,bladder pacemaker - not active at the present time   . BOWEL RESECTION    . CARDIAC CATHETERIZATION     done for stress related event, in [place] , [place]- told that every thing was ok, punctured femoral artery- treated /w Plasma   . CERVICAL ABLATION    . CHOLECYSTECTOMY    . EXCISIONAL TOTAL KNEE ARTHROPLASTY Left 12/09/2013   Procedure: POLYEXCHANGE WITH SYNOVECTOMY;  Surgeon: Dannielle Huh, MD;  Location: [facility] OR;  Service: Orthopedics;  Laterality: Left;  Marland Kitchen GASTRIC BYPASS    . HERNIA REPAIR    . ILEOSTOMY REVISION    . REVISION TOTAL KNEE ARTHROPLASTY Left 12/09/2013   dr Sherlean Foot  . TONSILLECTOMY    . TOTAL KNEE ARTHROPLASTY Left 2014   at [facility] History   No obstetric history on file.      Home Medications    Prior to Admission medications   Medication Sig Start Date End Date Taking? Authorizing Provider  busPIRone (BUSPAR) 30 MG tablet Take 30 mg by mouth 2 (two) times a day.  05/18/18  Yes [provider]  butalbital-acetaminophen-caffeine (FIORICET) 50-325-40 MG tablet Take 1-2 tablets by mouth every 6 (six) hours as needed for headache or migraine (max of 6 tabs /day). 07/19/18 07/19/19 Yes Harris, Abigail, PA-C  cholecalciferol (VITAMIN D3) 25 MCG (1000 UT) tablet Take 1,000 Units by mouth daily.    Yes [provider]  clonazePAM (KLONOPIN) 1 MG tablet Take 1 mg by mouth daily as needed for anxiety.    Yes [provider]  DULoxetine (CYMBALTA) 60 MG capsule Take 60 mg by mouth daily before breakfast.    Yes [provider]  ferrous sulfate 325 (65 FE) MG tablet Take 325 mg by mouth daily with breakfast.   Yes [provider]  fluticasone (FLONASE) 50 MCG/ACT nasal spray Place 2 sprays into the  nose daily.  06/06/12  Yes [provider]  gabapentin (NEURONTIN) 400 MG capsule Take 400 mg by mouth 2 (two) times a day. 06/13/18  Yes [provider]  lamoTRIgine (LAMICTAL) 200 MG tablet Take 200 mg by mouth at bedtime.    Yes [provider]  methylphenidate (RITALIN) 10 MG tablet Take 10 mg by mouth daily. 05/04/18  Yes [provider]  omeprazole (PRILOSEC) 20 MG capsule Take 20 mg by mouth 2 (two) times daily before a meal.   Yes [provider]  topiramate (TOPAMAX) 100 MG tablet Take 100 mg by mouth 2 (two) times a day. 06/22/16  Yes [provider]  aspirin EC 325 MG tablet Take 1 tablet (325 mg total) by mouth daily. Patient not taking: Reported on 07/19/2018 12/10/13   Altamese Cabal, PA-C  cephALEXin [facility]) 500 MG capsule 2 caps po bid x 7 days 07/19/18   Arthor Captain, PA-C  cyclobenzaprine (FLEXERIL) 5 MG tablet Take 1 tablet (5 mg total) by mouth 3 (three) times daily as needed for muscle spasms. 09/06/18   Charlynne Pander, MD  gabapentin (NEURONTIN) 100 MG capsule Take 2 capsules (200 mg total) by mouth daily as needed. 09/06/18   Charlynne Pander, MD  HYDROcodone-acetaminophen [facility]) 10-325 MG per tablet Take 1-2 tablets by mouth every 4 (four) hours as needed (breakthrough pain). Patient not taking: Reported on 07/19/2018 12/10/13   Altamese Cabal, PA-C  methocarbamol (ROBAXIN) 500 MG tablet Take 1-2 tablets (500-1,000 mg total) by mouth 4 (four) times daily. Patient not taking: Reported on 07/19/2018 12/10/13   Altamese Cabal, PA-C  morphine (MSIR) 15 MG tablet Take 1 tablet (15 mg total) by mouth every 4 (four) hours as needed for severe pain. Patient not taking: Reported on 07/19/2018 12/10/13   Altamese Cabal, PA-C  oxyCODONE-acetaminophen (PERCOCET/ROXICET) 5-325 MG tablet Take 1 tablet by mouth every 4 (four) hours as needed for severe pain. 10/13/18   Jade Burkard, PA-C  potassium chloride SA (K-DUR,KLOR-CON) 20 MEQ tablet  Take 1 tablet (20 mEq total) by mouth daily. Patient not taking: Reported on 07/19/2018 11/08/17   Roxy Horseman, PA-C    Family History No family history on file.  Social History Social History   Tobacco Use  . Smoking status: Never Smoker  . Smokeless tobacco: Never Used  Substance Use Topics  . Alcohol use: No  . Drug use: No     Allergies   Nitrofurantoin, Metoclopramide, and Tape   Review of Systems Review of Systems  Constitutional: Negative for chills and fever.  HENT: Negative for congestion.   Eyes: Negative for visual disturbance.  Respiratory: Negative for shortness of breath.   Cardiovascular:  Negative for chest pain.  Gastrointestinal: Positive for abdominal pain. Negative for blood in stool, diarrhea, nausea and vomiting.  Genitourinary: Positive for flank pain and hematuria. Negative for dysuria.  Musculoskeletal: Negative for myalgias.  Skin: Negative for rash.  Neurological: Negative for headaches.     Physical Exam Updated Vital Signs BP (!) 150/74 (BP Location: Right Arm)   Pulse (!) 50   Temp 98.1 F (36.7 C) (Oral)   Resp 17   SpO2 100%   Physical Exam Vitals signs and nursing note reviewed.  Constitutional:      Appearance: She is not ill-appearing.  HENT:     Head: Normocephalic and atraumatic.  Eyes:     Conjunctiva/sclera: Conjunctivae normal.  Neck:     Musculoskeletal: Neck supple.  Cardiovascular:     Rate and Rhythm: Normal rate and regular rhythm.  Pulmonary:     Effort: Pulmonary effort is normal.     Breath sounds: Normal breath sounds.  Abdominal:     Palpations: Abdomen is soft.     Tenderness: There is abdominal tenderness. There is right CVA tenderness. There is no left CVA tenderness, guarding or rebound.     Comments: Soft, tenderness to right CVA as well as RLQ; neg mcburneys, +BS throughout, no r/g/r, neg murphy's,   Skin:    General: Skin is warm and dry.  Neurological:     Mental Status: She is alert.       ED Treatments / Results  Labs (all labs ordered are listed, but only abnormal results are displayed) Labs Reviewed  BASIC METABOLIC PANEL - Abnormal; Notable for the following components:      Result Value   Glucose, Bld 113 (*)    All other components within normal limits  CBC WITH DIFFERENTIAL/PLATELET - Abnormal; Notable for the following components:   WBC 11.5 (*)    Neutro Abs 8.0 (*)    Eosinophils Absolute 0.7 (*)    All other components within normal limits  URINALYSIS, ROUTINE W REFLEX MICROSCOPIC - Abnormal; Notable for the following components:   APPearance CLOUDY (*)    Hgb urine dipstick LARGE (*)    Protein, ur 30 (*)    Leukocytes,Ua SMALL (*)    RBC / HPF >50 (*)    Bacteria, UA RARE (*)    All other components within normal limits  URINE CULTURE    EKG None  Radiology Ct Renal Stone Study  Result Date: 10/13/2018 CLINICAL DATA:  Onset severe right flank and groin pain today. Gross hematuria. EXAM: CT ABDOMEN AND PELVIS WITHOUT CONTRAST TECHNIQUE: Multidetector CT imaging of the abdomen and pelvis was performed following the standard protocol without IV contrast. COMPARISON:  None. FINDINGS: Lower chest: Mild dependent atelectasis in the left lung base. Lung bases otherwise clear. No pleural or pericardial effusion. Hepatobiliary: No focal liver abnormality is seen. Status post cholecystectomy. No biliary dilatation. Pancreas: Unremarkable. No pancreatic ductal dilatation or surrounding inflammatory changes. Spleen: Normal in size without focal abnormality. Adrenals/Urinary Tract: There is mild-to-moderate right hydronephrosis due to a 0.7 cm distal right ureteral stone. No left hydronephrosis or ureteral stone. Punctate nonobstructing stone lower pole right kidney is seen. The patient has multiple left renal stones. Most are punctate in size but there is a 1 cm stone in the mid to lower pole. Urinary bladder is unremarkable. Adrenal glands appear normal.  Stomach/Bowel: The patient is status post gastric bypass and right hemicolectomy. No bowel obstruction or inflammatory process is identified. Vascular/Lymphatic: No significant  vascular findings are present. No enlarged abdominal or pelvic lymph nodes. Reproductive: Right ovarian cyst measuring 1.3 cm craniocaudal by 1.8 cm AP by 1.3 cm transverse is noted. Uterus and left adnexa appear normal. Other: None. Musculoskeletal: No acute or focal bony abnormality. IMPRESSION: Mild to moderate right hydronephrosis due to a 0.7 cm distal right ureteral stone. Punctate nonobstructing stone lower pole right kidney. The patient has multiple left renal stones. Electronically Signed   By: Drusilla Kannerhomas  Dalessio M.D.   On: 10/13/2018 12:08    Procedures Procedures (including critical care time)  Medications Ordered in ED Medications  sodium chloride 0.9 % bolus 1,000 mL (1,000 mLs Intravenous Bolus from Bag 10/13/18 1128)  ondansetron (ZOFRAN) injection 4 mg (4 mg Intravenous Given 10/13/18 1151)  ketorolac (TORADOL) 30 MG/ML injection 30 mg (30 mg Intravenous Given 10/13/18 1151)     Initial Impression / Assessment and Plan / ED Course  I have reviewed the triage vital signs and the nursing notes.  Pertinent labs & imaging results that were available during my care of the patient were reviewed by me and considered in my medical decision making (see chart for details).    53 year old female who presents with complaints of right flank pain that worsened this morning.  Was being treated outpatient with Cipro for questionable UTI although it did not grow anything on the culture.  Was seen by PCP yesterday and given Flomax although did not have a chance to pick it up.  No previous history of kidney stones.  Past surgical history includes appendectomy.  Patient does have right CVA tenderness on exam as well as right lower quadrant.  Obtain baseline blood work as well as CT renal stone study today.  Will give IV fluids at this  time.  Held off on additional pain medication given 100 mcg of fentanyl in route.   Nursing staff informed that pt is requesting something else for pain and nausea - will give toradol and zofran.   Leukocytosis present at 11.5 although compared to previous it is unchanged. Hgb stable. No electrolyte abnormalities. Creatinine within normal limits. Urinalysis does have hgb as well as 21-50 WBCs per HPF and leuks. No nitrites.   CT scan shows 7 mm stone on right with moderate hydronephrosis. In the setting of infected urine and hydro will consult urology for further recommendations.   CBC Latest Ref Rng & Units 10/13/2018 07/19/2018 07/13/2018  WBC 4.0 - 10.5 K/uL 11.5(H) 11.4(H) 7.0  Hemoglobin 12.0 - 15.0 g/dL 16.113.4 09.612.8 04.512.5  Hematocrit 36.0 - 46.0 % 43.2 40.2 40.5  Platelets 150 - 400 K/uL 236 265 260   Discussed case with Dr. Laverle PatterBorden with Urology - recommends pt follow up outpatient. He believes the WBCs may be from the yeast that is also seen in the urine - patient has diflucan at home that was prescribed after taking the abx last week. She reports she typically gets yeast infections with abx. Dr. Laverle PatterBorden does not recommend additional abx at this time. Will send urine for culture.   Patient already has flomax at the pharmacy awaiting her to pick it up. Will prescribe very short course of pain medication. Pt advised to follow up with Urology on Tuesday. Strict return precautions discussed including worsening pain, fever, difficulty urinating. She is in agreement with plan and stable for discharge home.   This note was prepared using Dragon voice recognition software and may include unintentional dictation errors due to the inherent limitations of voice recognition software.  Final Clinical Impressions(s) / ED Diagnoses   Final diagnoses:  Flank pain  Kidney stone  Hydronephrosis, unspecified hydronephrosis type  Yeast infection    ED Discharge Orders         Ordered     oxyCODONE-acetaminophen (PERCOCET/ROXICET) 5-325 MG tablet  Every 4 hours PRN     10/13/18 1255           Tanda RockersVenter, Cayenne Breault, PA-C 10/13/18 1258    Milagros Lollykstra, Richard S, MD 10/15/18 [phone]

## 2018-10-15 LAB — URINE CULTURE

## 2018-10-24 ENCOUNTER — Emergency Department (HOSPITAL_COMMUNITY): Payer: PRIVATE HEALTH INSURANCE

## 2018-10-24 ENCOUNTER — Emergency Department (HOSPITAL_COMMUNITY)
Admission: EM | Admit: 2018-10-24 | Discharge: 2018-10-24 | Disposition: A | Discharge status: Home / Self Care | Payer: PRIVATE HEALTH INSURANCE | Attending: Emergency Medicine | Admitting: Emergency Medicine

## 2018-10-24 ENCOUNTER — Other Ambulatory Visit: Payer: Self-pay

## 2018-10-24 ENCOUNTER — Encounter (HOSPITAL_COMMUNITY): Payer: Self-pay | Admitting: Family Medicine

## 2018-10-24 DIAGNOSIS — E119 Type 2 diabetes mellitus without complications: Secondary | ICD-10-CM | POA: Diagnosis not present

## 2018-10-24 DIAGNOSIS — J45909 Unspecified asthma, uncomplicated: Secondary | ICD-10-CM | POA: Insufficient documentation

## 2018-10-24 DIAGNOSIS — N3000 Acute cystitis without hematuria: Secondary | ICD-10-CM | POA: Diagnosis not present

## 2018-10-24 DIAGNOSIS — N23 Unspecified renal colic: Secondary | ICD-10-CM | POA: Insufficient documentation

## 2018-10-24 DIAGNOSIS — R109 Unspecified abdominal pain: Secondary | ICD-10-CM | POA: Diagnosis present

## 2018-10-24 DIAGNOSIS — I10 Essential (primary) hypertension: Secondary | ICD-10-CM | POA: Insufficient documentation

## 2018-10-24 DIAGNOSIS — E039 Hypothyroidism, unspecified: Secondary | ICD-10-CM | POA: Insufficient documentation

## 2018-10-24 DIAGNOSIS — Z96652 Presence of left artificial knee joint: Secondary | ICD-10-CM | POA: Diagnosis not present

## 2018-10-24 DIAGNOSIS — Z79899 Other long term (current) drug therapy: Secondary | ICD-10-CM | POA: Insufficient documentation

## 2018-10-24 LAB — URINALYSIS, ROUTINE W REFLEX MICROSCOPIC
Bacteria, UA: NONE SEEN
Bilirubin Urine: NEGATIVE
Glucose, UA: NEGATIVE mg/dL
Ketones, ur: NEGATIVE mg/dL
Nitrite: NEGATIVE
Protein, ur: 100 mg/dL — AB
Specific Gravity, Urine: 1.017 (ref 1.005–1.030)
pH: 5 (ref 5.0–8.0)

## 2018-10-24 LAB — BASIC METABOLIC PANEL
Anion gap: 9 (ref 5–15)
BUN: 17 mg/dL (ref 6–20)
CO2: 23 mmol/L (ref 22–32)
Calcium: 8.9 mg/dL (ref 8.9–10.3)
Chloride: 108 mmol/L (ref 98–111)
Creatinine, Ser: 0.73 mg/dL (ref 0.44–1.00)
GFR calc Af Amer: >60 mL/min (ref >60)
GFR calc non Af Amer: >60 mL/min (ref >60)
Glucose, Bld: 88 mg/dL (ref 70–99)
Potassium: 3.5 mmol/L (ref 3.5–5.1)
Sodium: 140 mmol/L (ref 135–145)

## 2018-10-24 LAB — CBC
HCT: 40.3 % (ref 36.0–46.0)
Hemoglobin: 12.6 g/dL (ref 12.0–15.0)
MCH: 28.3 pg (ref 26.0–34.0)
MCHC: 31.3 g/dL (ref 30.0–36.0)
MCV: 90.6 fL (ref 80.0–100.0)
Platelets: 255 10*3/uL (ref 150–400)
RBC: 4.45 MIL/uL (ref 3.87–5.11)
RDW: 12.7 % (ref 11.5–15.5)
WBC: 8.5 10*3/uL (ref 4.0–10.5)
nRBC: 0 % (ref 0.0–0.2)

## 2018-10-24 MED ORDER — FLUCONAZOLE 150 MG PO TABS
150.0000 mg | ORAL_TABLET | Freq: Once | ORAL | Status: AC
  Administered 2018-10-24: 150 mg via ORAL
  Filled 2018-10-24: qty 1

## 2018-10-24 MED ORDER — SODIUM CHLORIDE 0.9 % IV SOLN
1.0000 g | Freq: Once | INTRAVENOUS | Status: AC
  Administered 2018-10-24: 10:00:00 1 g via INTRAVENOUS
  Filled 2018-10-24: qty 10

## 2018-10-24 MED ORDER — SODIUM CHLORIDE 0.9 % IV BOLUS
1000.0000 mL | Freq: Once | INTRAVENOUS | Status: AC
  Administered 2018-10-24: 1000 mL via INTRAVENOUS

## 2018-10-24 MED ORDER — ONDANSETRON HCL 4 MG/2ML IJ SOLN
4.0000 mg | Freq: Once | INTRAMUSCULAR | Status: AC
  Administered 2018-10-24: 4 mg via INTRAVENOUS
  Filled 2018-10-24: qty 2

## 2018-10-24 MED ORDER — CEPHALEXIN 500 MG PO CAPS: 500.0000 mg | ORAL_CAPSULE | Freq: Three times a day (TID) | ORAL | 0 refills | Status: DC

## 2018-10-24 MED ORDER — KETOROLAC TROMETHAMINE 30 MG/ML IJ SOLN
30.0000 mg | Freq: Once | INTRAMUSCULAR | Status: AC
  Administered 2018-10-24: 30 mg via INTRAVENOUS
  Filled 2018-10-24: qty 1

## 2018-10-24 MED ORDER — HYDROMORPHONE HCL 1 MG/ML IJ SOLN
1.0000 mg | Freq: Once | INTRAMUSCULAR | Status: AC
  Administered 2018-10-24: 1 mg via INTRAVENOUS
  Filled 2018-10-24: qty 1

## 2018-10-24 NOTE — ED Provider Notes (Signed)
[facility] DEPT Provider Note   CSN: [id number] Arrival date & time: 10/24/18  0409     History   Chief Complaint Chief Complaint  Patient presents with  . Flank Pain    HPI Courtland Coppa is a 53 y.o. female.     Patient presents to the emergency department for evaluation of right flank pain.  Patient has been having ongoing issues with right flank pain secondary to ureterolithiasis.  She was seen in the ER 10 days ago and seen to have a 7 mm distal ureteral stone.  Patient reports that early this morning she had a sudden increase in her pain.  She is experiencing constant and severe right flank pain.     Past Medical History:  Diagnosis Date  . Anxiety   . Arthritis    L knee   . Asthma    rare exposure to smoke or other irritatnt   . Back pain   . Clostridium difficile diarrhea   . Depression   . Diabetes mellitus without complication ([place])    in combination with hyperinsulinnemia   . GERD (gastroesophageal reflux disease)   . Hyperinsulinemia   . Hypertension   . Hypothyroidism   . Neuromuscular disorder ([place])    tourette syndrome, followed by neurology,neuropathy   . Thyroid disease     Patient Active Problem List   Diagnosis Date Noted  . 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 ABDOMINAL SURGERY    . APPENDECTOMY    . BLADDER SURGERY     bladder tumor removed- 1997,bladder pacemaker - not active at the present time   . BOWEL RESECTION    . CARDIAC CATHETERIZATION     done for stress related event, in [place] , [place]- told that every thing was ok, punctured femoral artery- treated /w Plasma   . CERVICAL ABLATION    . CHOLECYSTECTOMY    . EXCISIONAL TOTAL KNEE ARTHROPLASTY Left 12/09/2013   Procedure: POLYEXCHANGE WITH SYNOVECTOMY;  Surgeon: Vickey Huger, MD;  Location: [facility];  Service: Orthopedics;  Laterality: Left;  Marland Kitchen GASTRIC BYPASS    . HERNIA REPAIR    .  ILEOSTOMY REVISION    . REVISION TOTAL KNEE ARTHROPLASTY Left 12/09/2013   dr Ronnie Derby  . TONSILLECTOMY    . TOTAL KNEE ARTHROPLASTY Left 2014   at [facility] History   No obstetric history on file.      Home Medications    Prior to Admission medications   Medication Sig Start Date End Date Taking? Authorizing Provider  aspirin EC 325 MG tablet Take 1 tablet (325 mg total) by mouth daily. Patient not taking: Reported on 07/19/2018 12/10/13   Carlynn Spry, PA-C  busPIRone (BUSPAR) 30 MG tablet Take 30 mg by mouth 2 (two) times a day. 05/18/18   [provider]  butalbital-acetaminophen-caffeine (FIORICET) 50-325-40 MG tablet Take 1-2 tablets by mouth every 6 (six) hours as needed for headache or migraine (max of 6 tabs /day). 07/19/18 07/19/19  Margarita Mail, PA-C  cephALEXin (KEFLEX) 500 MG capsule 2 caps po bid x 7 days Patient not taking: Reported on 10/13/2018 07/19/18   Margarita Mail, PA-C  cholecalciferol (VITAMIN D3) 25 MCG (1000 UT) tablet Take 1,000 Units by mouth daily.     [provider]  clonazePAM (KLONOPIN) 1 MG tablet Take 1 mg by mouth daily as needed for anxiety.     [provider]  cyclobenzaprine (FLEXERIL) 5 MG tablet Take 1 tablet (5 mg total) by mouth 3 (three) times daily as needed for muscle spasms. Patient not taking: Reported on 10/13/2018 09/06/18   Charlynne PanderYao, David Hsienta, MD  DULoxetine (CYMBALTA) 60 MG capsule Take 60 mg by mouth daily before breakfast.     [provider]  ferrous sulfate 325 (65 FE) MG tablet Take 325 mg by mouth daily with breakfast.    [provider]  fluticasone (FLONASE) 50 MCG/ACT nasal spray Place 2 sprays into the nose daily.  06/06/12   [provider]  gabapentin (NEURONTIN) 100 MG capsule Take 2 capsules (200 mg total) by mouth daily as needed. Patient not taking: Reported on 10/13/2018 09/06/18   Charlynne PanderYao, David Hsienta, MD  gabapentin (NEURONTIN) 400 MG capsule Take 400 mg by mouth 2  (two) times a day. 06/13/18   [provider]  HYDROcodone-acetaminophen (NORCO) 10-325 MG per tablet Take 1-2 tablets by mouth every 4 (four) hours as needed (breakthrough pain). Patient not taking: Reported on 07/19/2018 12/10/13   Altamese CabalJones, Maurice, PA-C  lamoTRIgine (LAMICTAL) 200 MG tablet Take 200 mg by mouth at bedtime.     [provider]  methocarbamol (ROBAXIN) 500 MG tablet Take 1-2 tablets (500-1,000 mg total) by mouth 4 (four) times daily. Patient not taking: Reported on 07/19/2018 12/10/13   Altamese CabalJones, Maurice, PA-C  methylphenidate (RITALIN) 10 MG tablet Take 10 mg by mouth daily. 05/04/18   [provider]  morphine (MSIR) 15 MG tablet Take 1 tablet (15 mg total) by mouth every 4 (four) hours as needed for severe pain. Patient not taking: Reported on 07/19/2018 12/10/13   Altamese CabalJones, Maurice, PA-C  omeprazole (PRILOSEC) 20 MG capsule Take 20 mg by mouth 2 (two) times daily before a meal.    [provider]  oxyCODONE-acetaminophen (PERCOCET/ROXICET) 5-325 MG tablet Take 1 tablet by mouth every 4 (four) hours as needed for severe pain. 10/13/18   Venter, Margaux, PA-C  potassium chloride SA (K-DUR,KLOR-CON) 20 MEQ tablet Take 1 tablet (20 mEq total) by mouth daily. Patient not taking: Reported on 07/19/2018 11/08/17   Roxy HorsemanBrowning, Robert, PA-C  tamsulosin (FLOMAX) 0.4 MG CAPS capsule Take 0.4 mg by mouth daily. 10/12/18   [provider]  topiramate (TOPAMAX) 100 MG tablet Take 100 mg by mouth 2 (two) times a day. 06/22/16   [provider]    Family History History reviewed. No pertinent family history.  Social History Social History   Tobacco Use  . Smoking status: Never Smoker  . Smokeless tobacco: Never Used  Substance Use Topics  . Alcohol use: No  . Drug use: No     Allergies   Nitrofurantoin, Metoclopramide, and Tape   Review of Systems Review of Systems  Genitourinary: Positive for flank pain.  All other systems reviewed and are  negative.    Physical Exam Updated Vital Signs BP 114/68 (BP Location: Left Arm)   Pulse 61   Temp 98.4 F (36.9 C) (Oral)   Resp 14   SpO2 99%   Physical Exam Vitals signs and nursing note reviewed.  Constitutional:      General: She is not in acute distress.    Appearance: Normal appearance. She is well-developed.  HENT:     Head: Normocephalic and atraumatic.     Right Ear: Hearing normal.     Left Ear: Hearing normal.     Nose: Nose normal.  Eyes:     Conjunctiva/sclera: Conjunctivae normal.  Pupils: Pupils are equal, round, and reactive to light.  Neck:     Musculoskeletal: Normal range of motion and neck supple.  Cardiovascular:     Rate and Rhythm: Regular rhythm.     Heart sounds: S1 normal and S2 normal. No murmur. No friction rub. No gallop.   Pulmonary:     Effort: Pulmonary effort is normal. No respiratory distress.     Breath sounds: Normal breath sounds.  Chest:     Chest wall: No tenderness.  Abdominal:     General: Bowel sounds are normal.     Palpations: Abdomen is soft.     Tenderness: There is no abdominal tenderness. There is no guarding or rebound. Negative signs include Murphy's sign and McBurney's sign.     Hernia: No hernia is present.  Musculoskeletal: Normal range of motion.  Skin:    General: Skin is warm and dry.     Findings: No rash.  Neurological:     Mental Status: She is alert and oriented to person, place, and time.     GCS: GCS eye subscore is 4. GCS verbal subscore is 5. GCS motor subscore is 6.     Cranial Nerves: No cranial nerve deficit.     Sensory: No sensory deficit.     Coordination: Coordination normal.  Psychiatric:        Speech: Speech normal.        Behavior: Behavior normal.        Thought Content: Thought content normal.      ED Treatments / Results  Labs (all labs ordered are listed, but only abnormal results are displayed) Labs Reviewed  URINALYSIS, ROUTINE W REFLEX MICROSCOPIC - Abnormal; Notable  for the following components:      Result Value   APPearance HAZY (*)    Hgb urine dipstick SMALL (*)    Protein, ur 100 (*)    Leukocytes,Ua TRACE (*)    All other components within normal limits  CBC  BASIC METABOLIC PANEL    EKG None  Radiology No results found.  Procedures Procedures (including critical care time)  Medications Ordered in ED Medications  HYDROmorphone (DILAUDID) injection 1 mg (has no administration in time range)  ondansetron (ZOFRAN) injection 4 mg (has no administration in time range)     Initial Impression / Assessment and Plan / ED Course  I have reviewed the triage vital signs and the nursing notes.  Pertinent labs & imaging results that were available during my care of the patient were reviewed by me and considered in my medical decision making (see chart for details).        Patient presents for evaluation of right flank pain.  She has had 10 days of pain secondary to a right-sided kidney stone.  Patient has a distal ureteral stone that is 7 mm in diameter.  It has not passed in the 10 days since it was initially discovered.  She has, however, have been seen by urology and is being scheduled for a procedure to remove the stone.  Will provide analgesia here in the ER.  Signed out to oncoming ER physician to follow progression.  If not improved, will need to consult urology.  Final Clinical Impressions(s) / ED Diagnoses   Final diagnoses:  Ureteral colic    ED Discharge Orders    None       Gilda Crease, MD 10/24/18 [phone]

## 2018-10-24 NOTE — ED Provider Notes (Signed)
  Physical Exam  BP 105/66   Pulse 64   Temp 98.4 F (36.9 C) (Oral)   Resp 16   Ht 5\' 2"  (1.575 m)   Wt 70.3 kg   SpO2 95%   BMI 28.35 kg/m   Physical Exam  ED Course/Procedures     Procedures  MDM  Care assumed at 7 am. Patient recently seen in the ED and had CT that showed 7 mm R ureteral stone and L intra renal stones. Has been on oxycodone and flomax but had worsening pain yesterday. Sign out pending labs, UA, pain control  8 am Still in pain despite dilaudid. UA ? UTI. Labs unremarkable. Ordered xray and US renal   11:38 AM US showed mod R hydro. Pain controlled with dilaudid, toradol. I talked to Dr. Milus Height from urology. He states that patient was seen at [facility]. If pain under control, patient can see her urologist and schedule for laser surgery. Pain improved. Has oxycodone and flomax at home. UA ? UTI, given rocephin and will dc home with keflex. Urine culture sent      Drenda Freeze, MD 10/24/18 1141

## 2018-10-24 NOTE — Discharge Instructions (Addendum)
Continue taking your oxycodone and flomax as prescribed   Take keflex three times daily for a week.   Please call your urologist office for appointment this week   Return to ER if you have fever, severe pain, vomiting, flank pain

## 2018-10-24 NOTE — ED Triage Notes (Signed)
Patient is from home and transported via [facility]. Patient is complaining of increased right flank pain. She was seen on 10/13/2018, diagnosed with kidney stones, and hydronephrosis. She informed EMS that she didn't pick up her pain medication. Also, complains of feeling razors when urinating.

## 2018-10-25 LAB — URINE CULTURE: Culture: <10000 — AB

## 2019-01-16 ENCOUNTER — Ambulatory Visit: Payer: Medicare Other | Admitting: Allergy

## 2019-02-25 ENCOUNTER — Ambulatory Visit: Payer: PRIVATE HEALTH INSURANCE | Admitting: Allergy

## 2019-02-25 ENCOUNTER — Other Ambulatory Visit: Payer: Self-pay

## 2019-02-25 ENCOUNTER — Encounter: Payer: Self-pay | Admitting: Allergy

## 2019-02-25 VITALS — BP 140/92 | HR 66 | Temp 98.7°F | Resp 16 | Ht 62.0 in | Wt 166.0 lb

## 2019-02-25 DIAGNOSIS — D7219 Other eosinophilia: Secondary | ICD-10-CM | POA: Diagnosis not present

## 2019-02-25 DIAGNOSIS — T781XXD Other adverse food reactions, not elsewhere classified, subsequent encounter: Secondary | ICD-10-CM | POA: Diagnosis not present

## 2019-02-25 DIAGNOSIS — Z8709 Personal history of other diseases of the respiratory system: Secondary | ICD-10-CM | POA: Diagnosis not present

## 2019-02-25 DIAGNOSIS — T781XXA Other adverse food reactions, not elsewhere classified, initial encounter: Secondary | ICD-10-CM | POA: Insufficient documentation

## 2019-02-25 DIAGNOSIS — J310 Chronic rhinitis: Secondary | ICD-10-CM

## 2019-02-25 NOTE — Patient Instructions (Addendum)
Today's skin testing showed: Negative to environmental allergies and foods.   Today's breathing test was normal.  Monitor symptoms.   Monitor GI symptoms.  . Get bloodwork:  o We are ordering labs, so please allow 1-2 weeks for the results to come back. o With the newly implemented Cures Act, the labs might be visible to you at the same time that they become visible to me. However, I will not address the results until all of the results are back, so please be patient.  o In the meantime, continue recommendations in your patient instructions, including avoidance measures (if applicable), until you hear from me.  Follow up in 2 months or sooner if needed.

## 2019-02-25 NOTE — Progress Notes (Signed)
New Patient Note  RE: Cynthia Cohen MRN: [id number] DOB: [birth year] Date of Office Visit: 02/25/2019  Referring provider: Lara Mulch, MD Primary care provider: Patient, No Pcp Per  Chief Complaint: Food Intolerance (conserned about milk, soy, wheat. she does not eat meat. has periods of constipation and diarrhea and thinks a food allergy may be the issue. she had elevated eosinophils with labs. )  History of Present Illness: I had the pleasure of seeing Cynthia Cohen for initial evaluation at the [facility] [place] [facility] on 02/27/2019. She is a 54 y.o. female, who is referred here by PCP for the evaluation of eosinophilia and food allergies.  Food: Patient is concerned about milk, soy and wheat allergy/intolerance. She has been vegetarian since her 31s.  Dairy products cause reflux, abdominal pains and diarrhea. Patient does tolerate cheese products but has been avoiding milk for the past 5 years.  Patient does consume soy and wheat on a daily basis and is concerned if they are contributing to her GI issues and fatigue.  She is having issues with lose stools, constipation, reflux. This possibly worsened after she had some complications with abdominal hernia and fistula. She had an ileostomy in the past as well. This happened about 3-4 years ago.    Patient has not seen a GI physician for many years. Patient had gastric bypass in 2007.   Past work up includes: none. Dietary History: patient has been eating other foods including cheese, eggs, peanut, treenuts, sesame, soy, wheat, fruits and vegetables.  She reports reading labels and avoiding straight milk and meats in diet completely.   Eosinophilia: Patient had elevated eosinophils of 700 on 10/24/2018 and 1100 on 07/19/2018.  No history of recent travel.  No recent stool tests.  2-3 months ago started on trintellix. No new meds in June.   Assessment and Plan: Cynthia Cohen is a 54 y.o. female with: Adverse food  reaction Patient concerned about food allergies especially to milk, soy and wheat. Noticing some reflux, abdominal pain and diarrhea. 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 No recent GI evaluation. Patient is a vegetarian.   Today's skin testing showed: Negative to foods.   Discussed with patient that her symptoms may be due to a variety of reasons including her history of GI surgeries which may make it more challenging for her gut system to process certain foods. I doubt they are IgE mediated food reactions at this time.  Advised her to monitor her GI symptoms. May warrant re-evaluation by GI in the future if persistent.  Peripheral eosinophilia Mild eosinophilia in the past- eosinophils of 700 on 10/24/2018 and 1100 on 07/19/2018. Denies changes in medications during that time.   Personal history of allergies can cause some elevation of eosinophils however given her other symptoms will order some bloodwork/stool studies to rule out other etiologies.  Chronic rhinitis Mild rhinitis symptoms. Skin testing in 2012 was negative per patient report. She was on allergy injections in the 1990s for mold apparently.  Today's skin testing was negative to environmental allergies.   Return in about 2 months (around 04/25/2019).  Lab Orders     Ova and parasite examination     Comprehensive metabolic panel     CBC with Differential/Platelet     C-reactive protein     Sedimentation rate     Tryptase     ANCA Titers ( [facility])     B12  ANA w/Reflex     Strongyloides, Ab, IgG     Allergens w/Total IgE Area 2     IgE Food Basic w/Component Rfx     HIV Antibody (routine testing w rflx)  Other allergy screening: Asthma: no Rhino conjunctivitis: yes  Mild rhinitis symptoms.  Previous work up includes: skin testing in 2012 which was negative per patient report. Patient was on allergy injections for mold in the  1990s.  Previous ENT evaluation: no. Previous sinus imaging: no. History of nasal polyps: no. Medication allergy: yes Hymenoptera allergy: no Urticaria: no Eczema:no History of recurrent infections suggestive of immunodeficency: no  Diagnostics: Spirometry:  Tracings reviewed. Her effort: Good reproducible efforts. FVC: 3.45L FEV1: 3.09L, 125% predicted FEV1/FVC ratio: 79% Interpretation: Spirometry consistent with normal pattern.  Please see scanned spirometry results for details.  Skin Testing: Environmental allergy panel and select foods. Negative test to: as below.  Results discussed with patient/family. Airborne Adult Perc - 02/25/19 1500    Time Antigen Placed  0302    Allergen Manufacturer  Lavella Hammock    Location  Back    Number of Test  59    1. Control-Buffer 50% Glycerol  Negative    2. Control-Histamine 1 mg/ml  2+    3. Albumin saline  Negative    4. [place]fford  Negative    5. Guatemala  Negative    6. Johnson  Negative    7. Pine Hollow Blue  Negative    8. Meadow Fescue  Negative    9. Perennial Rye  Negative    10. Sweet Vernal  Negative    11. Timothy  Negative    12. Cocklebur  Negative    13. Burweed Marshelder  Negative    14. Ragweed, short  Negative    15. Ragweed, Giant  Negative    16. Plantain,  English  Negative    17. Lamb's Quarters  Negative    18. Sheep Sorrell  Negative    19. Rough Pigweed  Negative    20. Marsh Elder, Rough  Negative    21. Mugwort, Common  Negative    22. Ash mix  Negative    23. Birch mix  Negative    24. Beech American  Negative    25. Box, Elder  Negative    26. Cedar, red  Negative    27. Cottonwood, [place]  Negative    28. Elm mix  Negative    29. Hickory mix  Negative    30. Maple mix  Negative    31. Oak, [place] mix  Negative    32. Pecan Pollen  Negative    33. Pine mix  Negative    34. Sycamore Eastern  Negative    35. [place], Black Pollen  Negative    36. Alternaria alternata  Negative    37. Cladosporium  Herbarum  Negative    38. Aspergillus mix  Negative    39. Penicillium mix  Negative    40. Bipolaris sorokiniana (Helminthosporium)  Negative    41. Drechslera spicifera (Curvularia)  Negative    42. Mucor plumbeus  Negative    43. Fusarium moniliforme  Negative    44. Aureobasidium pullulans (pullulara)  Negative    45. Rhizopus oryzae  Negative    46. Botrytis cinera  Negative    47. Epicoccum nigrum  Negative    48. Phoma betae  Negative    49. Candida Albicans  Negative    50. Trichophyton mentagrophytes  Negative  51. Mite, D Farinae  5,000 AU/ml  Negative    52. Mite, D Pteronyssinus  5,000 AU/ml  Negative    53. Cat Hair 10,000 BAU/ml  Negative    54.  Dog Epithelia  Negative    55. Mixed Feathers  Negative    56. Horse Epithelia  Negative    57. Cockroach, German  Negative    58. Mouse  Negative    59. Tobacco Leaf  Negative     Food Adult Perc - 02/25/19 1500    Time Antigen Placed  9758    Allergen Manufacturer  Lavella Hammock    Location  Back    Number of allergen test  53    1. Peanut  Negative    2. Soybean  Negative    3. Wheat  Negative    4. Sesame  Negative    5. Milk, cow  Negative    6. Egg White, Chicken  Negative    7. Casein  Negative    10. Cashew  Negative    11. Pecan Food  Negative    12. [place]  Negative    13. Almond  Negative    14. Hazelnut  Negative    15. Bol[place] nut  Negative    16. Coconut  Negative    17. Pistachio  Negative    30. Barley  Negative    31. Oat   Negative    32. Rye   Negative    33. Hops  Negative    34. Rice  Negative    35. Cottonseed  Negative    36. Saccharomyces Cerevisiae   Negative    42. Tomato  Negative    43. White Potato  Negative    44. Sweet Potato  Negative    45. Pea, Green/English  Negative    46. Navy Bean  Negative    47. Mushrooms  Negative    48. Avocado  Negative    49. Onion  Negative    50. Cabbage  Negative    51. Carrots  Negative    52. Celery  Negative    53. Corn  Negative     54. Cucumber  Negative    55. Grape (White seedless)  Negative    56. Orange   Negative    57. Banana  Negative    58. Apple  Negative    59. Peach  Negative    60. Strawberry  Negative    61. Cantaloupe  Negative    62. Watermelon  Negative    63. Pineapple  Negative    64. Chocolate/Cacao bean  Negative    65. Karaya Gum  Negative    66. Acacia (Arabic Gum)  Negative    67. Cinnamon  Negative    68. Nutmeg  Negative    69. Ginger  Negative    70. Garlic  Negative    71. Pepper, black  Negative    72. Mustard  Negative       Past Medical History: Patient Active Problem List   Diagnosis Date Noted  . 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 Chronic rhinitis 02/25/2019  . Adverse food reaction 02/25/2019  . 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 Anxiety   . Arthritis    L knee   . Asthma    rare exposure to smoke or other irritatnt   . Back pain   . Clostridium difficile diarrhea   .  Depression   . Diabetes mellitus without complication ([place])    in combination with hyperinsulinnemia   . GERD (gastroesophageal reflux disease)   . Hyperinsulinemia   . Hypertension   . Hypothyroidism   . Neuromuscular disorder ([place])    tourette syndrome, followed by neurology,neuropathy   . Recurrent upper respiratory infection (URI)   . Thyroid disease    Past Surgical History: Past Surgical History:  Procedure Laterality Date  . ABDOMINAL SURGERY    . ADENOIDECTOMY    . APPENDECTOMY    . BLADDER SURGERY     bladder tumor removed- 1997,bladder pacemaker - not active at the present time   . BOWEL RESECTION    . CARDIAC CATHETERIZATION     done for stress related event, in [place] , [place]- told that every thing was ok, punctured femoral artery- treated /w Plasma   . CERVICAL ABLATION    . CHOLECYSTECTOMY    . EXCISIONAL TOTAL KNEE ARTHROPLASTY Left 12/09/2013   Procedure: POLYEXCHANGE WITH SYNOVECTOMY;  Surgeon: Vickey Huger, MD;   Location: [facility];  Service: Orthopedics;  Laterality: Left;  Marland Kitchen GASTRIC BYPASS    . HERNIA REPAIR    . ILEOSTOMY REVISION    . REVISION TOTAL KNEE ARTHROPLASTY Left 12/09/2013   dr Ronnie Derby  . TONSILLECTOMY    . TOTAL KNEE ARTHROPLASTY Left 2014   at [facility]    Medication List:  Current Outpatient Medications  Medication Sig Dispense Refill  . clonazePAM (KLONOPIN) 1 MG tablet Take 1 mg by mouth daily as needed for anxiety.     . ferrous sulfate 325 (65 FE) MG tablet Take 325 mg by mouth daily with breakfast.    . fluticasone (FLONASE) 50 MCG/ACT nasal spray Place 2 sprays into the nose daily.     Marland Kitchen gabapentin (NEURONTIN) 400 MG capsule Take 400 mg by mouth 2 (two) times a day.    Marland Kitchen glucagon 1 MG injection Inject as instructed on the kit as needed for severe symptomatic low blood sugars    . lamoTRIgine (LAMICTAL) 200 MG tablet Take 200 mg by mouth at bedtime.     . Methylphenidate HCl ER, XR, 15 MG CP24 Take by mouth.    . NYSTATIN powder APPLY AA BID    . ondansetron (ZOFRAN-ODT) 4 MG disintegrating tablet Take 8 mg by mouth 2 (two) times daily.    . pantoprazole (PROTONIX) 20 MG tablet Take 20 mg by mouth daily.    . risperiDONE (RISPERDAL) 1 MG tablet Take by mouth.    . TRINTELLIX 5 MG TABS tablet Take 5 mg by mouth daily.    . Vitamin D, Ergocalciferol, (DRISDOL) 1.25 MG (50000 UNIT) CAPS capsule Take 50,000 Units by mouth once a week.    . vortioxetine HBr (TRINTELLIX) 10 MG TABS tablet Take by mouth.     No current facility-administered medications for this visit.   Allergies: Allergies  Allergen Reactions  . Nitrofurantoin Rash  . Metoclopramide Other (See Comments)    jitters  . Tape Dermatitis and Rash    Localized adhesive reaction. Paper tape is okay.   Social History: Social History   Socioeconomic History  . Marital status: Married    Spouse name: Not on file  . Number of children: Not on file  . Years of education: Not on file  . Highest education level: Not  on file  Occupational History  . Not on file  Tobacco Use  . Smoking status: Passive Smoke Exposure - Never Smoker  .  Smokeless tobacco: Never Used  . Tobacco comment: in clients' home.   Substance and Sexual Activity  . Alcohol use: No  . Drug use: No  . Sexual activity: Not on file  Other Topics Concern  . Not on file  Social History Narrative  . Not on file   Social Determinants of Health   Financial Resource Strain:   . Difficulty of Paying Living Expenses: Not on file  Food Insecurity:   . Worried About Charity fundraiser in the Last Year: Not on file  . Ran Out of Food in the Last Year: Not on file  Transportation Needs:   . Lack of Transportation (Medical): Not on file  . Lack of Transportation (Non-Medical): Not on file  Physical Activity:   . Days of Exercise per Week: Not on file  . Minutes of Exercise per Session: Not on file  Stress:   . Feeling of Stress : Not on file  Social Connections:   . Frequency of Communication with Friends and Family: Not on file  . Frequency of Social Gatherings with Friends and Family: Not on file  . Attends Religious Services: Not on file  . Active Member of Clubs or Organizations: Not on file  . Attends Archivist Meetings: Not on file  . Marital Status: Not on file   Lives in a 56 year old home. 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 Diabetes Mother   . Hypertension Mother   . Allergic rhinitis Mother   . Aneurysm Mother   . Anxiety disorder Father   . Alcohol abuse Father   . Parkinson's disease Father    Problem                               Relation Asthma                                   No  Eczema                                No  Food allergy                           Mother  Allergic rhino conjunctivitis     Mother, father, sister  Review of Systems  Constitutional: Negative for appetite change, chills, fever and unexpected weight change.  HENT: Negative for congestion and rhinorrhea.   Eyes: Positive for itching.  Respiratory: Negative for cough, chest tightness, shortness of breath and wheezing.   Cardiovascular: Negative for chest pain.  Gastrointestinal: Positive for constipation and diarrhea. Negative for abdominal pain.  Genitourinary: Negative for difficulty urinating.  Skin: Negative for rash.  Neurological: Negative for headaches.   Objective: BP (!) 140/92 (BP Location: Left Arm, Patient Position: Sitting, Cuff Size: Normal)   Pulse 66   Temp 98.7 F (37.1 C) (Temporal)   Resp 16   Ht '5\' 2"'  (1.575 m)   Wt 166 lb (75.3 kg)   SpO2 99%   BMI 30.36 kg/m  Body  mass index is 30.36 kg/m. Physical Exam  Constitutional: She is oriented to person, place, and time. She appears well-developed and well-nourished.  HENT:  Head: Normocephalic and atraumatic.  Right Ear: External ear normal.  Left Ear: External ear normal.  Nose: Nose normal.  Mouth/Throat: Oropharynx is clear and moist.  Eyes: Conjunctivae and EOM are normal.  Cardiovascular: Normal rate, regular rhythm and normal heart sounds. Exam reveals no gallop and no friction rub.  No murmur heard. Pulmonary/Chest: Effort normal and breath sounds normal. She has no wheezes. She has no rales.  Abdominal: Soft.  Musculoskeletal:     Cervical back: Neck supple.  Neurological: She is alert and oriented to person, place, and time.  Skin: Skin is warm. No rash noted.  Psychiatric: She has a normal mood and affect. Her behavior is normal.  Nursing note and vitals reviewed.  The plan was reviewed with the patient/family, and all questions/concerned were addressed.  It was my pleasure to see Cynthia Cohen today and participate in her care. Please feel free to contact me with any  questions or concerns.  Sincerely,  Rexene Alberts, DO Allergy & Immunology  [facility] office: [phone] [facility] office: [place] office: [phone]

## 2019-02-27 ENCOUNTER — Telehealth: Payer: Self-pay

## 2019-02-27 DIAGNOSIS — D7219 Other eosinophilia: Secondary | ICD-10-CM

## 2019-02-27 LAB — COMPREHENSIVE METABOLIC PANEL
ALT: 14 IU/L (ref 0–32)
AST: 17 IU/L (ref 0–40)
Albumin/Globulin Ratio: 1.8 (ref 1.2–2.2)
Albumin: 4.4 g/dL (ref 3.8–4.9)
Alkaline Phosphatase: 117 IU/L (ref 39–117)
BUN/Creatinine Ratio: 11 (ref 9–23)
BUN: 7 mg/dL (ref 6–24)
Bilirubin Total: 0.3 mg/dL (ref 0.0–1.2)
CO2: 29 mmol/L (ref 20–29)
Calcium: 9.6 mg/dL (ref 8.7–10.2)
Chloride: 103 mmol/L (ref 96–106)
Creatinine, Ser: 0.62 mg/dL (ref 0.57–1.00)
GFR calc Af Amer: 117 mL/min/{1.73_m2} (ref >59)
GFR calc non Af Amer: 102 mL/min/{1.73_m2} (ref >59)
Globulin, Total: 2.4 g/dL (ref 1.5–4.5)
Glucose: 89 mg/dL (ref 65–99)
Potassium: 3.6 mmol/L (ref 3.5–5.2)
Sodium: 145 mmol/L — ABNORMAL HIGH (ref 134–144)
Total Protein: 6.8 g/dL (ref 6.0–8.5)

## 2019-02-27 LAB — ALLERGENS W/TOTAL IGE AREA 2
Alternaria Alternata IgE: <0.1 kU/L
Aspergillus Fumigatus IgE: <0.1 kU/L
Bermuda Grass IgE: <0.1 kU/L
Cat Dander IgE: <0.1 kU/L
Cedar, Mountain IgE: <0.1 kU/L
Cladosporium Herbarum IgE: <0.1 kU/L
Cockroach, German IgE: <0.1 kU/L
Common Silver Birch IgE: <0.1 kU/L
Cottonwood IgE: <0.1 kU/L
D Farinae IgE: <0.1 kU/L
D Pteronyssinus IgE: <0.1 kU/L
Dog Dander IgE: <0.1 kU/L
Elm, American IgE: <0.1 kU/L
IgE (Immunoglobulin E), Serum: 4 IU/mL — ABNORMAL LOW (ref 6–495)
Johnson Grass IgE: <0.1 kU/L
Maple/Box Elder IgE: <0.1 kU/L
Mouse Urine IgE: <0.1 kU/L
Oak, White IgE: <0.1 kU/L
Pecan, Hickory IgE: <0.1 kU/L
Penicillium Chrysogen IgE: <0.1 kU/L
Pigweed, Rough IgE: <0.1 kU/L
Ragweed, Short IgE: <0.1 kU/L
Sheep Sorrel IgE Qn: <0.1 kU/L
Timothy Grass IgE: <0.1 kU/L
White Mulberry IgE: <0.1 kU/L

## 2019-02-27 LAB — CBC WITH DIFFERENTIAL/PLATELET
Basophils Absolute: 0.1 10*3/uL (ref 0.0–0.2)
Basos: 1 %
EOS (ABSOLUTE): 0.5 10*3/uL — ABNORMAL HIGH (ref 0.0–0.4)
Eos: 8 %
Hematocrit: 40.9 % (ref 34.0–46.6)
Hemoglobin: 13.7 g/dL (ref 11.1–15.9)
Immature Grans (Abs): 0 10*3/uL (ref 0.0–0.1)
Immature Granulocytes: 0 %
Lymphocytes Absolute: 1.9 10*3/uL (ref 0.7–3.1)
Lymphs: 31 %
MCH: 28.7 pg (ref 26.6–33.0)
MCHC: 33.5 g/dL (ref 31.5–35.7)
MCV: 86 fL (ref 79–97)
Monocytes Absolute: 0.4 10*3/uL (ref 0.1–0.9)
Monocytes: 6 %
Neutrophils Absolute: 3.2 10*3/uL (ref 1.4–7.0)
Neutrophils: 54 %
Platelets: 245 10*3/uL (ref 150–450)
RBC: 4.78 x10E6/uL (ref 3.77–5.28)
RDW: 12.2 % (ref 11.7–15.4)
WBC: 6 10*3/uL (ref 3.4–10.8)

## 2019-02-27 LAB — TRYPTASE: Tryptase: 6 ug/L (ref 2.2–13.2)

## 2019-02-27 LAB — C-REACTIVE PROTEIN: CRP: 5 mg/L (ref 0–10)

## 2019-02-27 LAB — VITAMIN B12: Vitamin B-12: 880 pg/mL (ref 232–1245)

## 2019-02-27 LAB — IGE FOOD BASIC W/COMPONENT RFX
Codfish IgE: <0.1 kU/L
F001-IgE Egg White: <0.1 kU/L
F002-IgE Milk: <0.1 kU/L
Peanut, IgE: <0.1 kU/L
Soybean IgE: <0.1 kU/L
Wheat IgE: <0.1 kU/L

## 2019-02-27 LAB — SEDIMENTATION RATE: Sed Rate: 28 mm/hr (ref 0–40)

## 2019-02-27 LAB — ANA W/REFLEX: Anti Nuclear Antibody (ANA): NEGATIVE

## 2019-02-27 LAB — STRONGYLOIDES, AB, IGG: Strongyloides, Ab, IgG: NEGATIVE

## 2019-02-27 LAB — ANCA TITERS
Atypical pANCA: <1:20 {titer}
C-ANCA: <1:20 {titer}
P-ANCA: <1:20 {titer}

## 2019-02-27 LAB — HIV ANTIBODY (ROUTINE TESTING W REFLEX): HIV Screen 4th Generation wRfx: NONREACTIVE

## 2019-02-27 NOTE — Assessment & Plan Note (Signed)
Mild rhinitis symptoms. Skin testing in 2012 was negative per patient report. She was on allergy injections in the 1990s for mold apparently.  Today's skin testing was negative to environmental allergies.

## 2019-02-27 NOTE — Telephone Encounter (Signed)
Pt calling today asking about her labs that were sent to [facility].  Pt states that she asked for her labs to be sent to [facility] instead of [facility].  Pt states that her insurance will pay more if they were sent to [facility] versus [facility], its not that [facility] did not accept her insurance.  Per our phlebotomist, she did not have any issues entering her insurance information and it accepted it with no problem.  Pt would like for her Ova and Parasite lab to be printed so she herself can go to [facility].  Pt requested to talk to corporate, I gave her call to our manager Princella Ion so she could voice her concerns.

## 2019-02-27 NOTE — Assessment & Plan Note (Addendum)
Mild eosinophilia in the past- eosinophils of 700 on 10/24/2018 and 1100 on 07/19/2018. Denies changes in medications during that time.   Personal history of allergies can cause some elevation of eosinophils however given her other symptoms will order some bloodwork/stool studies to rule out other etiologies.

## 2019-02-27 NOTE — Assessment & Plan Note (Signed)
Patient concerned about food allergies especially to milk, soy and wheat. Noticing some reflux, abdominal pain and diarrhea. 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 No recent GI evaluation. Patient is a vegetarian.   Today's skin testing showed: Negative to foods.   Discussed with patient that her symptoms may be due to a variety of reasons including her history of GI surgeries which may make it more challenging for her gut system to process certain foods. I doubt they are IgE mediated food reactions at this time.  Advised her to monitor her GI symptoms. May warrant re-evaluation by GI in the future if persistent.

## 2019-02-27 NOTE — Telephone Encounter (Signed)
Orders placed for ova & parasites.

## 2019-02-28 NOTE — Addendum Note (Signed)
Addended by: Illene Bolus on: 02/28/2019 12:26 PM   Modules accepted: Orders

## 2019-02-28 NOTE — Telephone Encounter (Signed)
Lab order for Ova and parasite faxed to [facility].  Faxed to [phone] to the [facility]. Practice Administrator, Anibal Henderson has been notified of conversation with patient regarding labs drawn on 02/24/2018 in office by [facility].  Cynthia Cohen will notify Loraine Leriche with [facility]. Per conversation with patient she will get future labs done at [facility] unless she has a change in insurance allowing coverage with [facility].   Patient aware lab charges will be reviewed.  Patient informed to call the office if she has any concerns.

## 2019-04-29 ENCOUNTER — Ambulatory Visit: Payer: Medicare Other | Admitting: Allergy

## 2019-05-24 ENCOUNTER — Encounter (HOSPITAL_COMMUNITY): Payer: Self-pay

## 2019-05-24 ENCOUNTER — Ambulatory Visit (HOSPITAL_COMMUNITY)
Admission: EM | Admit: 2019-05-24 | Discharge: 2019-05-24 | Disposition: A | Discharge status: Home / Self Care | Payer: PRIVATE HEALTH INSURANCE | Attending: Internal Medicine | Admitting: Internal Medicine

## 2019-05-24 ENCOUNTER — Ambulatory Visit (INDEPENDENT_AMBULATORY_CARE_PROVIDER_SITE_OTHER): Payer: PRIVATE HEALTH INSURANCE

## 2019-05-24 ENCOUNTER — Other Ambulatory Visit: Payer: Self-pay

## 2019-05-24 DIAGNOSIS — J189 Pneumonia, unspecified organism: Secondary | ICD-10-CM

## 2019-05-24 MED ORDER — PREDNISONE 20 MG PO TABS: 20.0000 mg | ORAL_TABLET | Freq: Every day | ORAL | 0 refills | Status: DC

## 2019-05-24 MED ORDER — AMOXICILLIN 500 MG PO CAPS: 1000.0000 mg | ORAL_CAPSULE | Freq: Three times a day (TID) | ORAL | 0 refills | Status: AC

## 2019-05-24 MED ORDER — BENZONATATE 100 MG PO CAPS: 100.0000 mg | ORAL_CAPSULE | Freq: Three times a day (TID) | ORAL | 0 refills | Status: DC | PRN

## 2019-05-24 MED ORDER — DOXYCYCLINE HYCLATE 100 MG PO CAPS: 100.0000 mg | ORAL_CAPSULE | Freq: Two times a day (BID) | ORAL | 0 refills | Status: AC

## 2019-05-24 MED ORDER — ALBUTEROL SULFATE HFA 108 (90 BASE) MCG/ACT IN AERS: 1.0000 | INHALATION_SPRAY | Freq: Four times a day (QID) | RESPIRATORY_TRACT | 0 refills | Status: AC | PRN

## 2019-05-24 MED ORDER — PREDNISONE 20 MG PO TABS: 20.0000 mg | ORAL_TABLET | Freq: Every day | ORAL | 0 refills | Status: AC

## 2019-05-24 NOTE — ED Triage Notes (Signed)
Pt c/o moist cough and chest congestionx2 wks. Pt denies SOB. Pt has non labored breathing. Skin color WNL. Lungs are clear.

## 2019-05-24 NOTE — ED Provider Notes (Addendum)
[name]    CSN: [id number] Arrival date & time: 05/24/19  1548      History   Chief Complaint Chief Complaint  Patient presents with  . Cough    HPI Cynthia Cohen is a 54 y.o. female with history of asthma comes to urgent care with complaints of nasal congestion, nonproductive cough of 2 weeks duration.  Patient says symptoms started insidiously and is been progressive. Patient's cough has been largely nonproductive but recently she feels that she has some sputum production but does not know what color it is.  She denies any fever or chills.  She has had some dizziness with standing earlier today.  No nausea or vomiting.  No diarrhea.  No sick contacts.  Patient is fully immunized against COVID-19.  Last immunization was within the last 2 months.   HPI  Past Medical History:  Diagnosis Date  . Anxiety   . Arthritis    L knee   . Asthma    rare exposure to smoke or other irritatnt   . Back pain   . Clostridium difficile diarrhea   . Depression   . Diabetes mellitus without complication ([place])    in combination with hyperinsulinnemia   . GERD (gastroesophageal reflux disease)   . Hyperinsulinemia   . Hypertension   . Hypothyroidism   . Neuromuscular disorder ([place])    tourette syndrome, followed by neurology,neuropathy   . Recurrent upper respiratory infection (URI)   . Thyroid disease     Patient Active Problem List   Diagnosis Date Noted  . 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 Chronic rhinitis 02/25/2019  . Adverse food reaction 02/25/2019  . 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 ABDOMINAL SURGERY    . ADENOIDECTOMY    . APPENDECTOMY    . BLADDER SURGERY     bladder tumor removed- 1997,bladder pacemaker - not active at the present time   . BOWEL RESECTION    . CARDIAC CATHETERIZATION     done for stress related event, in [place] , [place]- told that every thing was ok, punctured femoral  artery- treated /w Plasma   . CERVICAL ABLATION    . CHOLECYSTECTOMY    . EXCISIONAL TOTAL KNEE ARTHROPLASTY Left 12/09/2013   Procedure: POLYEXCHANGE WITH SYNOVECTOMY;  Surgeon: Vickey Huger, MD;  Location: [facility];  Service: Orthopedics;  Laterality: Left;  Marland Kitchen GASTRIC BYPASS    . HERNIA REPAIR    . ILEOSTOMY REVISION    . REVISION TOTAL KNEE ARTHROPLASTY Left 12/09/2013   dr Ronnie Derby  . TONSILLECTOMY    . TOTAL KNEE ARTHROPLASTY Left 2014   at [facility] History   No obstetric history on file.      Home Medications    Prior to Admission medications   Medication Sig Start Date End Date Taking? Authorizing Provider  clonazePAM (KLONOPIN) 1 MG tablet Take 1 mg by mouth daily as needed for anxiety.     [provider]  ferrous sulfate 325 (65 FE) MG tablet Take 325 mg by mouth daily with breakfast.    [provider]  fluticasone (FLONASE) 50 MCG/ACT nasal spray Place 2 sprays into the nose daily.  06/06/12   [provider]  gabapentin (NEURONTIN) 400 MG capsule Take 400 mg by mouth 2 (two) times a day. 06/13/18   [provider]  glucagon 1 MG injection Inject as instructed on the kit as needed  for severe symptomatic low blood sugars 09/18/14   [provider]  lamoTRIgine (LAMICTAL) 200 MG tablet Take 200 mg by mouth at bedtime.     [provider]  Methylphenidate HCl ER, XR, 15 MG CP24 Take by mouth.    [provider]  NYSTATIN powder APPLY AA BID 11/16/18   [provider]  ondansetron (ZOFRAN-ODT) 4 MG disintegrating tablet Take 8 mg by mouth 2 (two) times daily. 01/05/19   [provider]  pantoprazole (PROTONIX) 20 MG tablet Take 20 mg by mouth daily. 02/05/19   [provider]  risperiDONE (RISPERDAL) 1 MG tablet Take by mouth. 02/15/19 03/17/19  [provider]  TRINTELLIX 5 MG TABS tablet Take 20 mg by mouth daily.  12/18/18   [provider]  Vitamin D, Ergocalciferol,  (DRISDOL) 1.25 MG (50000 UNIT) CAPS capsule Take 50,000 Units by mouth once a week. 02/03/19   [provider]  vortioxetine HBr (TRINTELLIX) 10 MG TABS tablet Take by mouth. 01/21/19   [provider]    Family History Family History  Problem Relation Age of Onset  . Diabetes Mother   . Hypertension Mother   . Allergic rhinitis Mother   . Aneurysm Mother   . Anxiety disorder Father   . Alcohol abuse Father   . Parkinson's disease Father     Social History Social History   Tobacco Use  . Smoking status: Passive Smoke Exposure - Never Smoker  . Smokeless tobacco: Never Used  . Tobacco comment: in clients' home.   Substance Use Topics  . Alcohol use: No  . Drug use: No     Allergies   Nitrofurantoin, Metoclopramide, and Tape   Review of Systems Review of Systems  Constitutional: Negative for activity change, chills, fatigue and fever.  HENT: Positive for congestion. Negative for rhinorrhea, sinus pressure and sinus pain.   Respiratory: Positive for cough. Negative for chest tightness, shortness of breath and wheezing.   Cardiovascular: Negative for chest pain and palpitations.  Gastrointestinal: Negative for abdominal pain, diarrhea, nausea and vomiting.  Genitourinary: Negative.   Musculoskeletal: Negative.   Neurological: Positive for dizziness. Negative for light-headedness, numbness and headaches.  Psychiatric/Behavioral: Negative for confusion and decreased concentration.     Physical Exam Triage Vital Signs ED Triage Vitals  Enc Vitals Group     BP 05/24/19 1626 126/65     Pulse Rate 05/24/19 1626 (!) 54     Resp 05/24/19 1626 16     Temp 05/24/19 1626 98.4 F (36.9 C)     Temp Source 05/24/19 1626 Oral     SpO2 05/24/19 1626 98 %     Weight 05/24/19 1627 160 lb (72.6 kg)     Height 05/24/19 1627 '5\' 3"'  (1.6 m)     Head Circumference --      Peak Flow --      Pain Score 05/24/19 1627 5     Pain Loc --      Pain Edu? --      Excl.  in [place]? --    No data found.  Updated Vital Signs BP 126/65   Pulse (!) 54   Temp 98.4 F (36.9 C) (Oral)   Resp 16   Ht '5\' 3"'  (1.6 m)   Wt 72.6 kg   SpO2 98%   BMI 28.34 kg/m   Visual Acuity Right Eye Distance:   Left Eye Distance:   Bilateral Distance:    Right Eye Near:   Left  Eye Near:    Bilateral Near:     Physical Exam Vitals and nursing note reviewed.  Constitutional:      General: She is not in acute distress.    Appearance: Normal appearance. She is not ill-appearing.  Eyes:     Conjunctiva/sclera: Conjunctivae normal.  Cardiovascular:     Rate and Rhythm: Normal rate and regular rhythm.     Pulses: Normal pulses.     Heart sounds: Normal heart sounds.  Pulmonary:     Effort: Pulmonary effort is normal. No respiratory distress.     Breath sounds: Normal breath sounds. No stridor. No wheezing or rhonchi.  Abdominal:     General: Bowel sounds are normal. There is no distension.     Hernia: No hernia is present.  Skin:    General: Skin is warm and dry.     Capillary Refill: Capillary refill takes less than 2 seconds.  Neurological:     Mental Status: She is alert.      UC Treatments / Results  Labs (all labs ordered are listed, but only abnormal results are displayed) Labs Reviewed - No data to display  EKG   Radiology No results found.  Procedures Procedures (including critical care time)  Medications Ordered in UC Medications - No data to display  Initial Impression / Assessment and Plan / UC Course  I have reviewed the triage vital signs and the nursing notes.  Pertinent labs & imaging results that were available during my care of the patient were reviewed by me and considered in my medical decision making (see chart for details).     1.  Community-acquired pneumonia: Chest x-ray is negative for acute lung infiltrate.  This was independently reviewed by me. Prednisone 20 mg orally daily for 5 days Tessalon Perles as needed for  cough Return precautions given No indication for COVID-19 testing No indication for antibiotics.   Addendum added at 38: Official read of chest x-ray shows a vague left upper extremity opacity suggestive of pneumonia.  Amoxicillin 1 g 3 times daily x5 days and doxycycline 100 mg twice daily x5 days as well as albuterol inhaler called into the pharmacy in addition to the other medications prescribed below.  Spoke with the patient to inform her of the updated plan of care. Final Clinical Impressions(s) / UC Diagnoses   Final diagnoses:  None   Discharge Instructions   None    ED Prescriptions    None     PDMP not reviewed this encounter.   Chase Picket, MD 05/24/19 1712    Chase Picket, MD 05/24/19 1740

## 2019-05-25 ENCOUNTER — Telehealth (HOSPITAL_COMMUNITY): Payer: Self-pay

## 2019-05-25 NOTE — Telephone Encounter (Signed)
Pt called to verify medication she was prescribed was correct.

## 2020-04-12 ENCOUNTER — Encounter (HOSPITAL_COMMUNITY): Payer: Self-pay | Admitting: Emergency Medicine

## 2020-04-12 ENCOUNTER — Other Ambulatory Visit: Payer: Self-pay

## 2020-04-12 ENCOUNTER — Ambulatory Visit (INDEPENDENT_AMBULATORY_CARE_PROVIDER_SITE_OTHER): Payer: Worker's Compensation

## 2020-04-12 ENCOUNTER — Ambulatory Visit (HOSPITAL_COMMUNITY): Payer: PRIVATE HEALTH INSURANCE

## 2020-04-12 ENCOUNTER — Ambulatory Visit (HOSPITAL_COMMUNITY)
Admission: EM | Admit: 2020-04-12 | Discharge: 2020-04-12 | Disposition: A | Discharge status: Home / Self Care | Payer: Worker's Compensation | Attending: Physician Assistant | Admitting: Physician Assistant

## 2020-04-12 DIAGNOSIS — M25511 Pain in right shoulder: Secondary | ICD-10-CM | POA: Diagnosis not present

## 2020-04-12 DIAGNOSIS — W19XXXA Unspecified fall, initial encounter: Secondary | ICD-10-CM

## 2020-04-12 MED ORDER — METHYLPREDNISOLONE 4 MG PO TBPK: ORAL_TABLET | ORAL | 0 refills | Status: DC

## 2020-04-12 MED ORDER — MELOXICAM 7.5 MG PO TABS: 7.5000 mg | ORAL_TABLET | Freq: Every day | ORAL | 0 refills | Status: DC

## 2020-04-12 NOTE — ED Provider Notes (Signed)
[facility]    CSN: [id number] Arrival date & time: 04/12/20  1005      History   Chief Complaint Chief Complaint  Patient presents with  . Arm Injury    HPI Cynthia Cohen is a 55 y.o. female.   55 year old female comes in for 6 day history of right arm pain after injury. She was at work when she tripped over desk leg and fell onto her side with arm outstretched. Denies head injury, loss of consciousness. States due to work schedule, has not been able to get checked until now. Has had decreased ROM of the shoulder with worsening pain with movement. Denies numbness/tingling, loss of grip strength. RHD. Took a vicodin today with some relief. Tylenol without relief.      Past Medical History:  Diagnosis Date  . Anxiety   . Arthritis    L knee   . Asthma    rare exposure to smoke or other irritatnt   . Back pain   . Clostridium difficile diarrhea   . Depression   . Diabetes mellitus without complication ([place])    in combination with hyperinsulinnemia   . GERD (gastroesophageal reflux disease)   . Hyperinsulinemia   . Hypertension   . Hypothyroidism   . Neuromuscular disorder ([place])    tourette syndrome, followed by neurology,neuropathy   . Recurrent upper respiratory infection (URI)   . Thyroid disease     Patient Active Problem List   Diagnosis Date Noted  . 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 Chronic rhinitis 02/25/2019  . Adverse food reaction 02/25/2019  . 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 ABDOMINAL SURGERY    . ADENOIDECTOMY    . APPENDECTOMY    . BLADDER SURGERY     bladder tumor removed- 1997,bladder pacemaker - not active at the present time   . BOWEL RESECTION    . CARDIAC CATHETERIZATION     done for stress related event, in [place] , [place]- told that every thing was ok, punctured femoral artery- treated /w Plasma   . CERVICAL ABLATION    . CHOLECYSTECTOMY    . EXCISIONAL  TOTAL KNEE ARTHROPLASTY Left 12/09/2013   Procedure: POLYEXCHANGE WITH SYNOVECTOMY;  Surgeon: Vickey Huger, MD;  Location: [facility];  Service: Orthopedics;  Laterality: Left;  Marland Kitchen GASTRIC BYPASS    . HERNIA REPAIR    . ILEOSTOMY REVISION    . REVISION TOTAL KNEE ARTHROPLASTY Left 12/09/2013   dr Ronnie Derby  . TONSILLECTOMY    . TOTAL KNEE ARTHROPLASTY Left 2014   at [facility] History   No obstetric history on file.      Home Medications    Prior to Admission medications   Medication Sig Start Date End Date Taking? Authorizing Provider  methylPREDNISolone (MEDROL DOSEPAK) 4 MG TBPK tablet Follow pack directions 04/12/20  Yes Iren Whipp V, PA-C  albuterol (VENTOLIN HFA) 108 (90 Base) MCG/ACT inhaler Inhale 1-2 puffs into the lungs every 6 (six) hours as needed for wheezing or shortness of breath. 05/24/19   Lamptey, Myrene Galas, MD  benzonatate (TESSALON) 100 MG capsule Take 1 capsule (100 mg total) by mouth 3 (three) times daily as needed for cough. 05/24/19   Lamptey, Myrene Galas, MD  clonazePAM (KLONOPIN) 1 MG tablet Take 1 mg by mouth daily as needed for anxiety.     [provider]  gabapentin (NEURONTIN) 400 MG capsule Take 400  mg by mouth 2 (two) times a day. 06/13/18   [provider]  glucagon 1 MG injection Inject as instructed on the kit as needed for severe symptomatic low blood sugars 09/18/14   [provider]  lamoTRIgine (LAMICTAL) 200 MG tablet Take 200 mg by mouth at bedtime.     [provider]  Methylphenidate HCl ER, XR, 15 MG CP24 Take by mouth.    [provider]  NYSTATIN powder APPLY AA BID 11/16/18   [provider]  ondansetron (ZOFRAN-ODT) 4 MG disintegrating tablet Take 8 mg by mouth 2 (two) times daily. 01/05/19   [provider]  pantoprazole (PROTONIX) 20 MG tablet Take 20 mg by mouth daily. 02/05/19   [provider]  risperiDONE (RISPERDAL) 1 MG tablet Take by mouth. 02/15/19 03/17/19  [provider]  Vitamin D, Ergocalciferol, (DRISDOL) 1.25 MG (50000 UNIT) CAPS capsule Take 50,000 Units by mouth once a week. 02/03/19   [provider]  vortioxetine HBr (TRINTELLIX) 10 MG TABS tablet Take by mouth. 01/21/19   [provider]  ferrous sulfate 325 (65 FE) MG tablet Take 325 mg by mouth daily with breakfast.  05/24/19  [provider]  fluticasone (FLONASE) 50 MCG/ACT nasal spray Place 2 sprays into the nose daily.  06/06/12 05/24/19  [provider]    Family History Family History  Problem Relation Age of Onset  . Diabetes Mother   . Hypertension Mother   . Allergic rhinitis Mother   . Aneurysm Mother   . Anxiety disorder Father   . Alcohol abuse Father   . Parkinson's disease Father     Social History Social History   Tobacco Use  . Smoking status: Passive Smoke Exposure - Never Smoker  . Smokeless tobacco: Never Used  . Tobacco comment: in clients' home.   Vaping Use  . Vaping Use: Never used  Substance Use Topics  . Alcohol use: No  . Drug use: No     Allergies   Nitrofurantoin, Metoclopramide, and Tape   Review of Systems Review of Systems  Reason unable to perform ROS: See HPI as above.     Physical Exam Triage Vital Signs ED Triage Vitals  Enc Vitals Group     BP 04/12/20 1030 95/64     Pulse Rate 04/12/20 1030 (!) 51     Resp 04/12/20 1030 17     Temp 04/12/20 1030 97.8 F (36.6 C)     Temp Source 04/12/20 1030 Oral     SpO2 04/12/20 1030 97 %     Weight --      Height --      Head Circumference --      Peak Flow --      Pain Score 04/12/20 1028 4     Pain Loc --      Pain Edu? --      Excl. in [place]? --    No data found.  Updated Vital Signs BP 95/64 (BP Location: Left Arm)   Pulse (!) 51   Temp 97.8 F (36.6 C) (Oral)   Resp 17   SpO2 97%   Physical Exam Constitutional:      General: She is not in acute distress.    Appearance: She is well-developed and well-nourished. She is not diaphoretic.   HENT:     Head: Normocephalic and atraumatic.  Eyes:     Conjunctiva/sclera: Conjunctivae normal.     Pupils: Pupils are equal, round, and reactive  to light.  Cardiovascular:     Rate and Rhythm: Normal rate and regular rhythm.     Heart sounds: Normal heart sounds. No murmur heard. No friction rub. No gallop.   Pulmonary:     Effort: Pulmonary effort is normal. No accessory muscle usage or respiratory distress.     Breath sounds: Normal breath sounds. No stridor. No decreased breath sounds, wheezing, rhonchi or rales.  Musculoskeletal:     Comments: No obvious swelling, erythema, contusion seen.  No tenderness to palpation of spinous processes.  Mild tenderness to palpation along the right middle trapezius.  Tenderness to palpation of anterior shoulder, along AC joint.  Shoulder able to abduct to 90 degrees, otherwise full range of motion.  Strength and sensation intact distally.  Radial pulse 2+.  Skin:    General: Skin is warm and dry.  Neurological:     Mental Status: She is alert and oriented to person, place, and time.      UC Treatments / Results  Labs (all labs ordered are listed, but only abnormal results are displayed) Labs Reviewed - No data to display  EKG   Radiology DG Shoulder Right  Result Date: 04/12/2020 CLINICAL DATA:  Acute RIGHT shoulder pain following fall. Initial encounter. EXAM: RIGHT SHOULDER - 2+ VIEW COMPARISON:  05/24/2019 chest radiograph FINDINGS: No acute fracture, subluxation or dislocation. The joint spaces are unremarkable. Remote RIGHT rib fractures are again identified. IMPRESSION: No acute abnormality. Electronically Signed   By: Margarette Canada M.D.   On: 04/12/2020 11:10    Procedures Procedures (including critical care time)  Medications Ordered in UC Medications - No data to display  Initial Impression / Assessment and Plan / UC Course  I have reviewed the triage vital signs and the nursing notes.  Pertinent labs & imaging results  that were available during my care of the patient were reviewed by me and considered in my medical decision making (see chart for details).    Discussed x-ray results with patient.  At first plan to start Mobic, for which patient then stated would like to avoid due to history of gastric bypass.  Switch to Medrol pack as directed.  Continue Tylenol as needed.  Return precautions given.  Otherwise to follow-up with occ health for further management.  Final Clinical Impressions(s) / UC Diagnoses   Final diagnoses:  Acute pain of right shoulder   ED Prescriptions    Medication Sig Dispense Auth. Provider   meloxicam (MOBIC) 7.5 MG tablet  (Status: Discontinued) Take 1-2 tablets (7.5-15 mg total) by mouth daily for 10 days. 20 tablet Indiah Heyden V, PA-C   methylPREDNISolone (MEDROL DOSEPAK) 4 MG TBPK tablet Follow pack directions 21 each Tasia Catchings, Emberlyn Burlison V, PA-C     I have reviewed the PDMP during this encounter.   Ok Edwards, PA-C 04/12/20 1125

## 2020-04-12 NOTE — Discharge Instructions (Addendum)
Xray did not show any bony abnormalities at this time. Start Mobic. Do not take ibuprofen (motrin/advil)/ naproxen (aleve) while on mobic. Ice compress, rest. Follow up with occupational health for further evaluation and management needed.

## 2020-04-12 NOTE — ED Triage Notes (Signed)
Pt presents with right arm pain and low back pain xs 6 days. States tripped over desk leg at work and was unable to break fall and full to ground.

## 2020-04-25 IMAGING — CT CT HEAD WITHOUT CONTRAST
3 of 4 series · 13 of 47 positions shown, 15 images · non-contrast
Comparison: None.

CLINICAL DATA: Headache for 2 weeks

EXAM:
CT HEAD WITHOUT CONTRAST
TECHNIQUE: Contiguous axial images were obtained from the base of the skull
through the vertex without intravenous contrast.

[Series 3: head wo · axial · 0.45mm/px · z∈[-146,-16]mm · 7 of 36 slices shown, 9 images]
[im 5/36  brain]
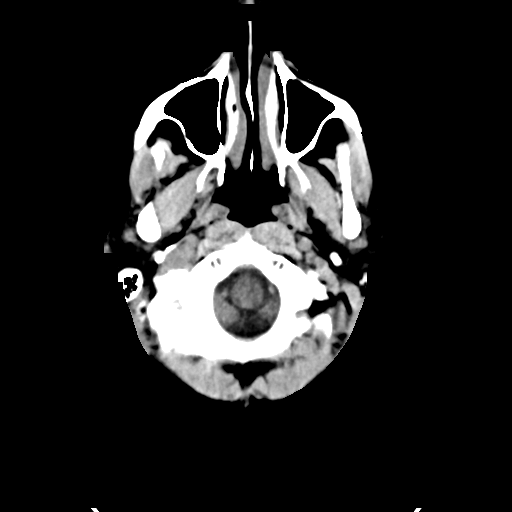
[im 5/36  bone]
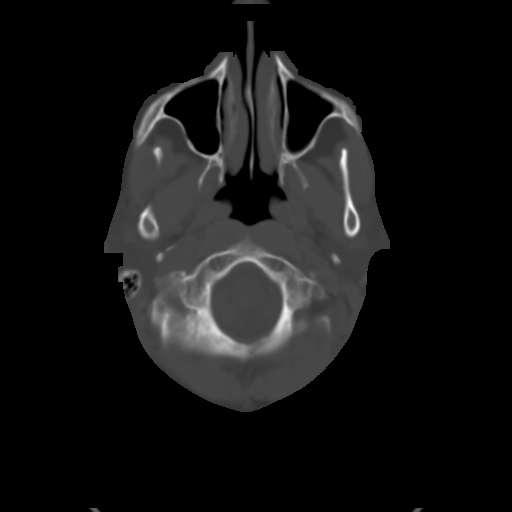
[im 9/36  brain]
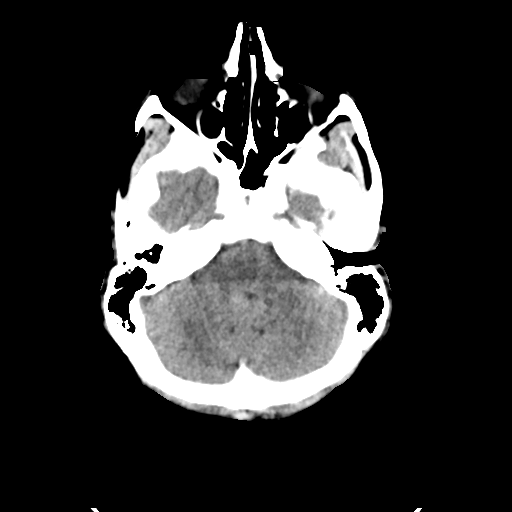
[im 14/36  brain]
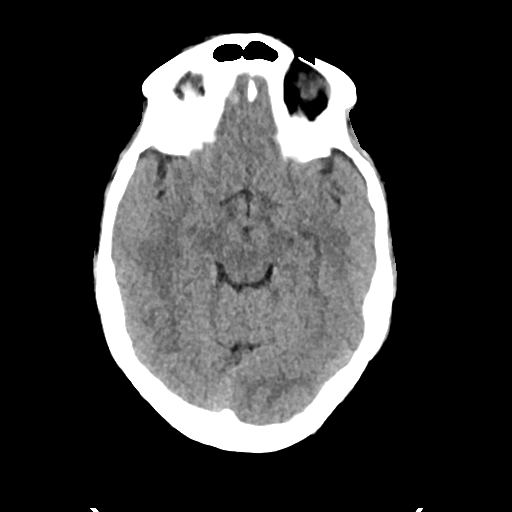
[im 18/36  brain]
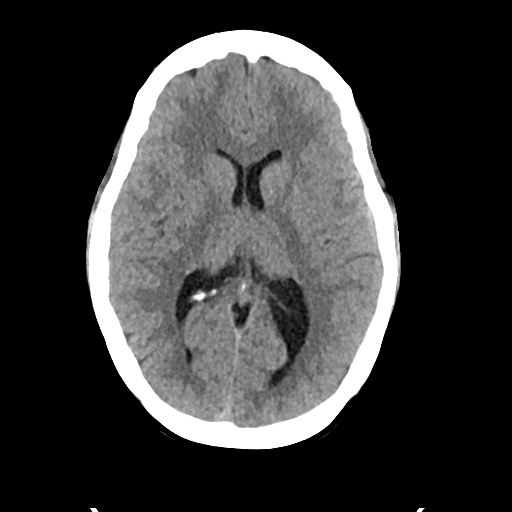
[im 22/36  brain]
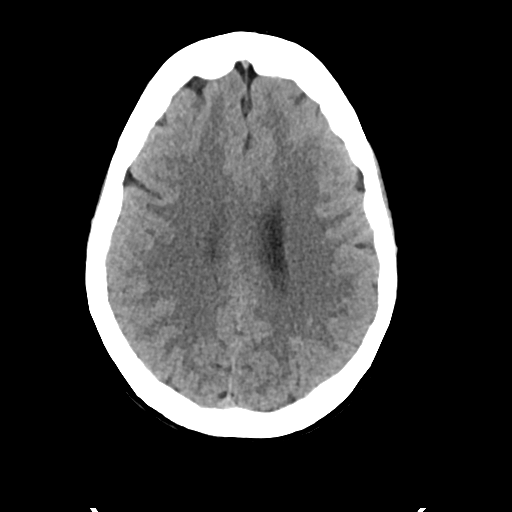
[im 22/36  bone]
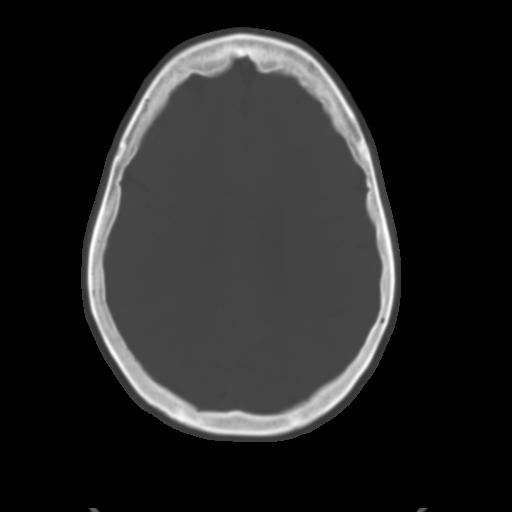
[im 27/36  brain]
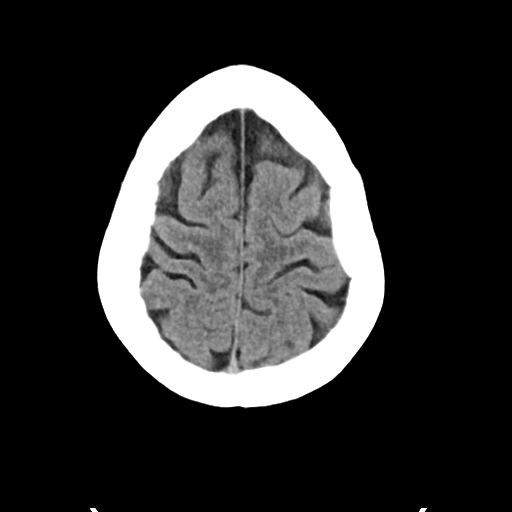
[im 31/36  brain]
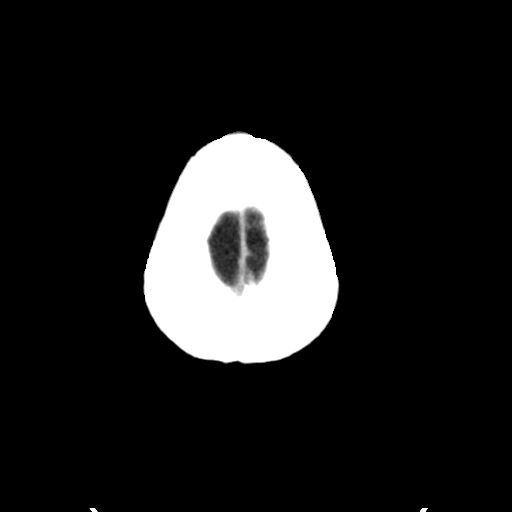

[Series 5: cor soft · coronal · 0.35mm/px · 3 of 75 slices shown]
[im 25/75  brain]
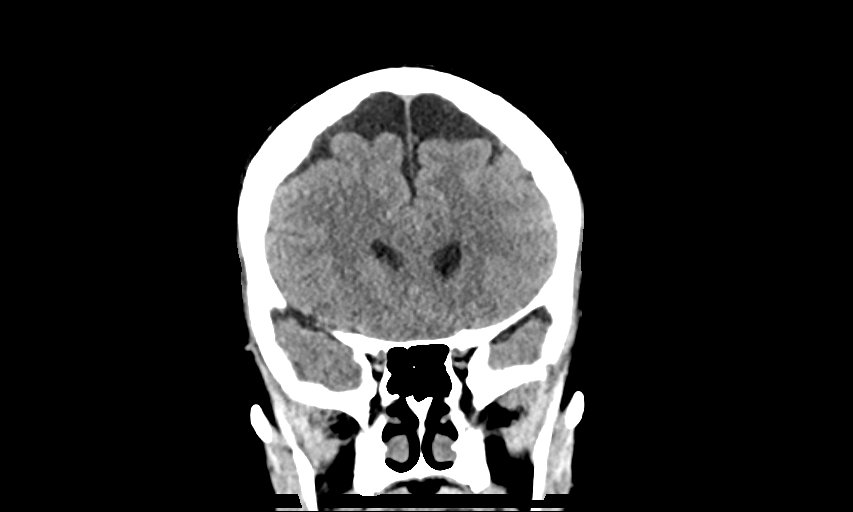
[im 33/75  brain]
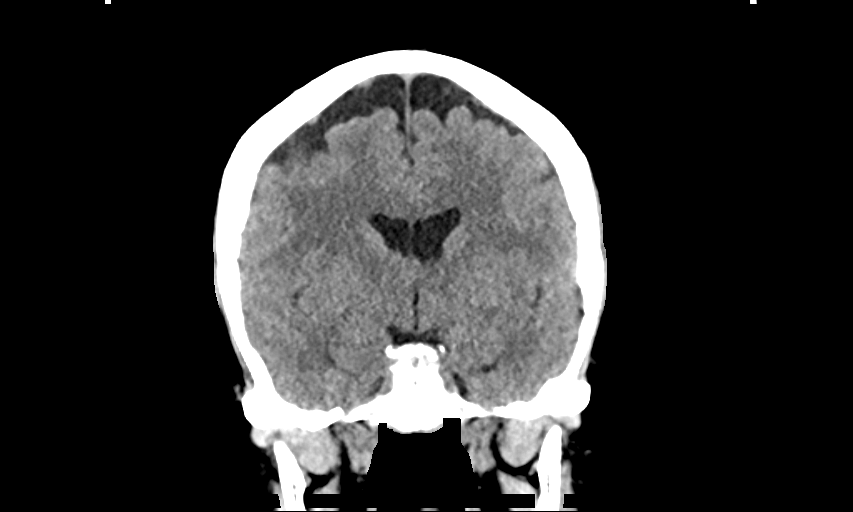
[im 42/75  brain]
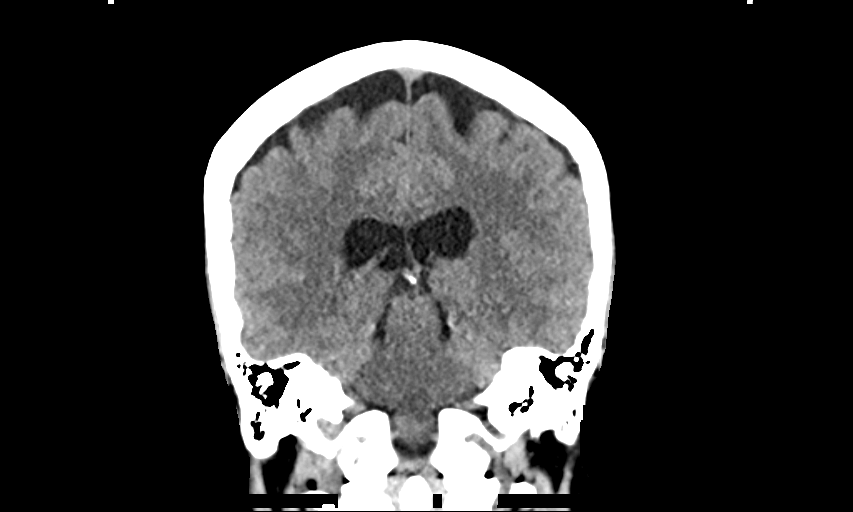

[Series 6: sag soft · sagittal · 0.35mm/px · 3 of 67 slices shown]
[im 23/67  brain]
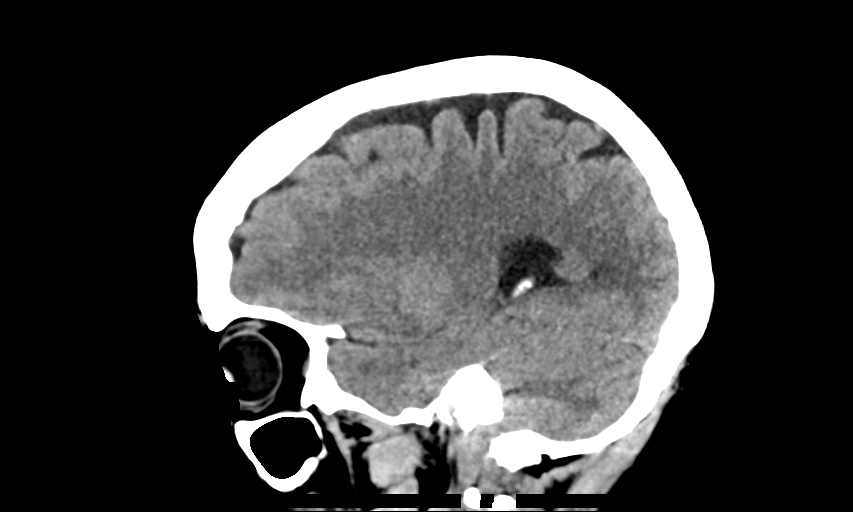
[im 34/67  brain]
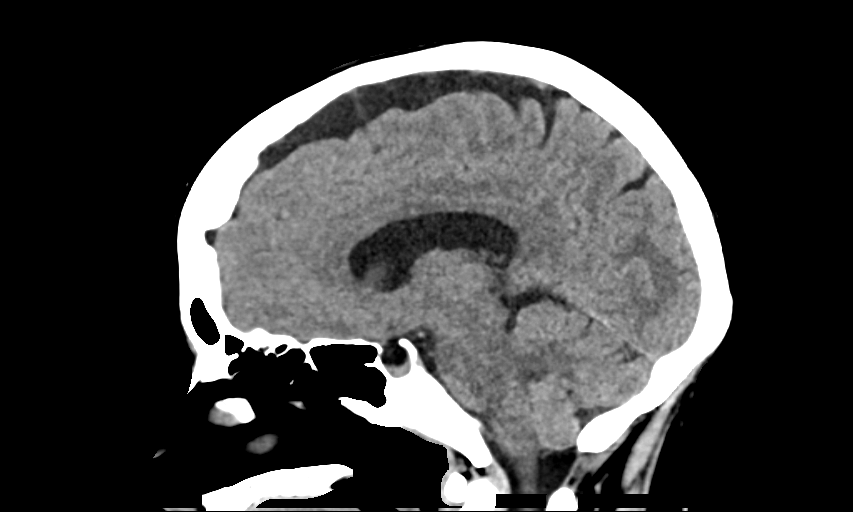
[im 45/67  brain]
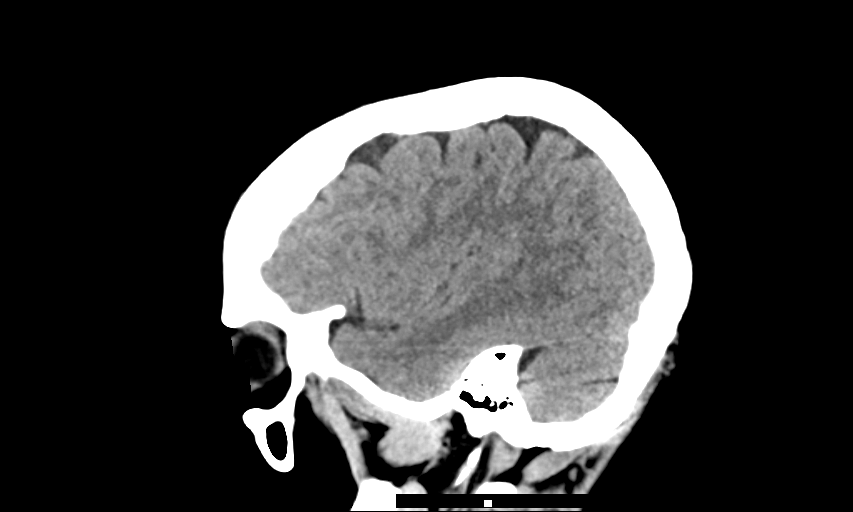

[13 of 47 positions shown; findings below may reference images not displayed]

FINDINGS: Brain: Ventricles are normal in size and configuration. There is
symmetric frontal atrophy bilaterally, mild. There is no
intracranial mass, hemorrhage, extra-axial fluid collection, or
midline shift. The brain parenchyma appears unremarkable. No acute
infarct is demonstrable.

Vascular: There is no hyperdense vessel. There is mild carotid
siphon calcification bilaterally.

Skull: The bony calvarium appears intact.

Sinuses/Orbits: There is mucosal thickening in several ethmoid air
cells. Paranasal sinuses otherwise clear. Orbits appear symmetric
bilaterally.

Other: Mastoid air cells are clear.
IMPRESSION: Mild frontal atrophy bilaterally. Ventricles normal in size and
configuration. Brain parenchyma appears unremarkable. No acute
infarct. No mass or hemorrhage.

Mild vascular calcification noted. Mucosal thickening noted in
several ethmoid air cells.

## 2020-07-26 ENCOUNTER — Emergency Department (HOSPITAL_COMMUNITY)
Admission: EM | Admit: 2020-07-26 | Discharge: 2020-07-26 | Disposition: A | Discharge status: Home / Self Care | Payer: BC Managed Care – PPO | Attending: Emergency Medicine | Admitting: Emergency Medicine

## 2020-07-26 ENCOUNTER — Encounter (HOSPITAL_COMMUNITY): Payer: Self-pay

## 2020-07-26 DIAGNOSIS — I10 Essential (primary) hypertension: Secondary | ICD-10-CM | POA: Insufficient documentation

## 2020-07-26 DIAGNOSIS — M542 Cervicalgia: Secondary | ICD-10-CM | POA: Insufficient documentation

## 2020-07-26 DIAGNOSIS — S46911A Strain of unspecified muscle, fascia and tendon at shoulder and upper arm level, right arm, initial encounter: Secondary | ICD-10-CM | POA: Diagnosis not present

## 2020-07-26 DIAGNOSIS — J45909 Unspecified asthma, uncomplicated: Secondary | ICD-10-CM | POA: Insufficient documentation

## 2020-07-26 DIAGNOSIS — Z96652 Presence of left artificial knee joint: Secondary | ICD-10-CM | POA: Insufficient documentation

## 2020-07-26 DIAGNOSIS — Z7951 Long term (current) use of inhaled steroids: Secondary | ICD-10-CM | POA: Diagnosis not present

## 2020-07-26 DIAGNOSIS — S4991XA Unspecified injury of right shoulder and upper arm, initial encounter: Secondary | ICD-10-CM | POA: Diagnosis present

## 2020-07-26 DIAGNOSIS — E119 Type 2 diabetes mellitus without complications: Secondary | ICD-10-CM | POA: Diagnosis not present

## 2020-07-26 DIAGNOSIS — Z7984 Long term (current) use of oral hypoglycemic drugs: Secondary | ICD-10-CM | POA: Insufficient documentation

## 2020-07-26 DIAGNOSIS — E039 Hypothyroidism, unspecified: Secondary | ICD-10-CM | POA: Diagnosis not present

## 2020-07-26 DIAGNOSIS — W1830XA Fall on same level, unspecified, initial encounter: Secondary | ICD-10-CM | POA: Insufficient documentation

## 2020-07-26 DIAGNOSIS — Z7722 Contact with and (suspected) exposure to environmental tobacco smoke (acute) (chronic): Secondary | ICD-10-CM | POA: Diagnosis not present

## 2020-07-26 MED ORDER — LIDOCAINE 5 % EX PTCH: 1.0000 | MEDICATED_PATCH | CUTANEOUS | 0 refills | Status: DC

## 2020-07-26 NOTE — ED Provider Notes (Signed)
[facility] DEPT Provider Note   CSN: [id number] Arrival date & time: 07/26/20  1204     History Chief Complaint  Patient presents with   Arm Pain    Cynthia Cohen is a 55 y.o. female.  55 year old female presents with complaint of ongoing right shoulder pain.  Patient states that she fell 3 months ago landing on her right side fully extended, went to urgent care at that time and had an x-ray of her shoulder, diagnosed with a sprain and treated with Medrol Dosepak.  Patient reports ongoing pain in her right shoulder.  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  No new falls or injuries.  No improvement with topical Voltaren and tizanidine.  Has not followed up with orthopedics.  No other complaints or concerns today.      Past Medical History:  Diagnosis Date   Anxiety    Arthritis    L knee    Asthma    rare exposure to smoke or other irritatnt    Back pain    Clostridium difficile diarrhea    Depression    Diabetes mellitus without complication ([place])    in combination with hyperinsulinnemia    GERD (gastroesophageal reflux disease)    Hyperinsulinemia    Hypertension    Hypothyroidism    Neuromuscular disorder ([place])    tourette syndrome, followed by neurology,neuropathy    Recurrent upper respiratory infection (URI)    Thyroid disease     Patient Active Problem List   Diagnosis Date Noted   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   Chronic rhinitis 02/25/2019   Adverse food reaction 02/25/2019   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    Past Surgical History:  Procedure Laterality Date   ABDOMINAL SURGERY     ADENOIDECTOMY     APPENDECTOMY     BLADDER SURGERY     bladder tumor removed- 1997,bladder pacemaker - not active at the present time    BOWEL RESECTION     CARDIAC CATHETERIZATION      done for stress related event, in [place] , [place]- told that every thing was ok, punctured femoral artery- treated /w Plasma    CERVICAL ABLATION     CHOLECYSTECTOMY     EXCISIONAL TOTAL KNEE ARTHROPLASTY Left 12/09/2013   Procedure: POLYEXCHANGE WITH SYNOVECTOMY;  Surgeon: Vickey Huger, MD;  Location: [facility];  Service: Orthopedics;  Laterality: Left;   GASTRIC BYPASS     HERNIA REPAIR     ILEOSTOMY REVISION     REVISION TOTAL KNEE ARTHROPLASTY Left 12/09/2013   dr Ronnie Derby   TONSILLECTOMY     TOTAL KNEE ARTHROPLASTY Left 2014   at [facility] History   No obstetric history on file.     Family History  Problem Relation Age of Onset   Diabetes Mother    Hypertension Mother    Allergic rhinitis Mother    Aneurysm Mother    Anxiety disorder Father    Alcohol abuse Father    Parkinson's disease Father     Social History   Tobacco Use   Smoking status: Passive Smoke Exposure - Never Smoker   Smokeless tobacco: Never   Tobacco comments:    in clients' home.   Vaping Use   Vaping Use: Never used  Substance Use Topics   Alcohol use: No  Drug use: No    Home Medications Prior to Admission medications   Medication Sig Start Date End Date Taking? Authorizing Provider  lidocaine (LIDODERM) 5 % Place 1 patch onto the skin daily. Remove & Discard patch within 12 hours or as directed by MD 07/26/20  Yes Tacy Learn, PA-C  albuterol (VENTOLIN HFA) 108 (90 Base) MCG/ACT inhaler Inhale 1-2 puffs into the lungs every 6 (six) hours as needed for wheezing or shortness of breath. 05/24/19   Lamptey, Myrene Galas, MD  benzonatate (TESSALON) 100 MG capsule Take 1 capsule (100 mg total) by mouth 3 (three) times daily as needed for cough. 05/24/19   Lamptey, Myrene Galas, MD  clonazePAM (KLONOPIN) 1 MG tablet Take 1 mg by mouth daily as needed for anxiety.     [provider]  gabapentin (NEURONTIN) 400 MG capsule Take 400 mg by mouth 2 (two) times a day. 06/13/18   [provider]  glucagon 1 MG injection Inject as instructed on the kit as needed for severe symptomatic low blood sugars 09/18/14   [provider]  lamoTRIgine (LAMICTAL) 200 MG tablet Take 200 mg by mouth at bedtime.     [provider]  Methylphenidate HCl ER, XR, 15 MG CP24 Take by mouth.    [provider]  methylPREDNISolone (MEDROL DOSEPAK) 4 MG TBPK tablet Follow pack directions 04/12/20   Tasia Catchings, Amy V, PA-C  NYSTATIN powder APPLY AA BID 11/16/18   [provider]  ondansetron (ZOFRAN-ODT) 4 MG disintegrating tablet Take 8 mg by mouth 2 (two) times daily. 01/05/19   [provider]  pantoprazole (PROTONIX) 20 MG tablet Take 20 mg by mouth daily. 02/05/19   [provider]  risperiDONE (RISPERDAL) 1 MG tablet Take by mouth. 02/15/19 03/17/19  [provider]  Vitamin D, Ergocalciferol, (DRISDOL) 1.25 MG (50000 UNIT) CAPS capsule Take 50,000 Units by mouth once a week. 02/03/19   [provider]  vortioxetine HBr (TRINTELLIX) 10 MG TABS tablet Take by mouth. 01/21/19   [provider]  ferrous sulfate 325 (65 FE) MG tablet Take 325 mg by mouth daily with breakfast.  05/24/19  [provider]  fluticasone (FLONASE) 50 MCG/ACT nasal spray Place 2 sprays into the nose daily.  06/06/12 05/24/19  [provider]    Allergies    Nitrofurantoin, Metoclopramide, and Tape  Review of Systems   Review of Systems  Constitutional:  Negative for fever.  Musculoskeletal:  Positive for arthralgias, myalgias and neck pain. Negative for joint swelling.  Skin:  Negative for rash and wound.  Allergic/Immunologic: Positive for immunocompromised state.  Neurological:  Positive for weakness. Negative for numbness.  Hematological:  Negative for adenopathy. Does not bruise/bleed easily.  Psychiatric/Behavioral:  Negative for confusion.   All other systems reviewed and are negative.  Physical Exam Updated Vital Signs BP 124/83 (BP  Location: Left Arm)   Pulse 75   Temp 98.6 F (37 C)   Resp 18   SpO2 99%   Physical Exam Vitals and nursing note reviewed.  Constitutional:      General: She is not in acute distress.    Appearance: She is well-developed. She is not diaphoretic.  HENT:     Head: Normocephalic and atraumatic.  Neck:   Cardiovascular:     Pulses: Normal pulses.  Pulmonary:     Effort: Pulmonary effort is normal.  Musculoskeletal:        General: Tenderness present. No swelling or deformity.  Cervical back: Normal range of motion. Tenderness present.     Comments: Pain with overhead ROM, no pain with internal/external rotation. TTP right trapezius area. Normal grip strength, strong radial pulse present.   Skin:    General: Skin is warm and dry.     Findings: No bruising, erythema or rash.  Neurological:     Mental Status: She is alert and oriented to person, place, and time.     Sensory: No sensory deficit.  Psychiatric:        Behavior: Behavior normal.    ED Results / Procedures / Treatments   Labs (all labs ordered are listed, but only abnormal results are displayed) Labs Reviewed - No data to display  EKG None  Radiology No results found.  Procedures Procedures   Medications Ordered in ED Medications - No data to display  ED Course  I have reviewed the triage vital signs and the nursing notes.  Pertinent labs & imaging results that were available during my care of the patient were reviewed by me and considered in my medical decision making (see chart for details).  Clinical Course as of 07/26/20 1249  Sun Jul 26, 7440  [id number] 55 year old female with ongoing right shoulder pain x3 months without acute injury.  Urgent care chart reviewed, x-ray reviewed, right shoulder unremarkable, remote right rib fractures.  Patient is questioning if a CT would be helpful as she cannot have an MRI.  Discussed importance of follow-up with orthopedics for likely PT, additional imaging if  necessary per Ortho.  We will try sling to rest, she does regularly use this arm, discussed very limited use of a sling to avoid developing adhesive capsulitis.  We will try Lidoderm patch and topical Voltaren with sling.  Patient to call and arrange Ortho follow-up. [LM]    Clinical Course User Index [LM] Roque Lias   MDM Rules/Calculators/A&P                           Final Clinical Impression(s) / ED Diagnoses Final diagnoses:  Strain of right shoulder, initial encounter    Rx / DC Orders ED Discharge Orders          Ordered    lidocaine (LIDODERM) 5 %  Every 24 hours        07/26/20 1242             Tacy Learn, PA-C 07/26/20 [place], [place], DO 07/26/20 1302

## 2020-07-26 NOTE — Discharge Instructions (Addendum)
Follow-up with orthopedics, call on Monday to schedule an appointment. Apply Lidoderm patch as discussed.  You can also apply Voltaren to the area. Limited use of the sling, remove periodically for gentle range of motion exercises.

## 2020-07-26 NOTE — ED Triage Notes (Addendum)
Pt arrived via walk in, c/o fall 3 months ago, was seen, no fracture, worsening pain the last couple days, shoulder down to wrist.

## 2021-08-30 ENCOUNTER — Other Ambulatory Visit: Payer: Self-pay

## 2021-08-30 ENCOUNTER — Encounter (HOSPITAL_COMMUNITY): Payer: Self-pay | Admitting: Emergency Medicine

## 2021-08-30 ENCOUNTER — Emergency Department (HOSPITAL_COMMUNITY)
Admission: EM | Admit: 2021-08-30 | Discharge: 2021-08-31 | Disposition: A | Discharge status: Home / Self Care | Payer: Medicare Other | Attending: Emergency Medicine | Admitting: Emergency Medicine

## 2021-08-30 DIAGNOSIS — M5442 Lumbago with sciatica, left side: Secondary | ICD-10-CM | POA: Insufficient documentation

## 2021-08-30 DIAGNOSIS — M5441 Lumbago with sciatica, right side: Secondary | ICD-10-CM | POA: Diagnosis not present

## 2021-08-30 DIAGNOSIS — M549 Dorsalgia, unspecified: Secondary | ICD-10-CM | POA: Diagnosis present

## 2021-08-30 DIAGNOSIS — R159 Full incontinence of feces: Secondary | ICD-10-CM | POA: Diagnosis not present

## 2021-08-30 DIAGNOSIS — I10 Essential (primary) hypertension: Secondary | ICD-10-CM | POA: Diagnosis not present

## 2021-08-30 DIAGNOSIS — G8929 Other chronic pain: Secondary | ICD-10-CM | POA: Diagnosis not present

## 2021-08-30 LAB — CBC WITH DIFFERENTIAL/PLATELET
Abs Immature Granulocytes: 0.02 10*3/uL (ref 0.00–0.07)
Basophils Absolute: 0.1 10*3/uL (ref 0.0–0.1)
Basophils Relative: 1 %
Eosinophils Absolute: 0.4 10*3/uL (ref 0.0–0.5)
Eosinophils Relative: 8 %
HCT: 39.8 % (ref 36.0–46.0)
Hemoglobin: 12.5 g/dL (ref 12.0–15.0)
Immature Granulocytes: 0 %
Lymphocytes Relative: 37 %
Lymphs Abs: 1.9 10*3/uL (ref 0.7–4.0)
MCH: 28.2 pg (ref 26.0–34.0)
MCHC: 31.4 g/dL (ref 30.0–36.0)
MCV: 89.6 fL (ref 80.0–100.0)
Monocytes Absolute: 0.3 10*3/uL (ref 0.1–1.0)
Monocytes Relative: 6 %
Neutro Abs: 2.5 10*3/uL (ref 1.7–7.7)
Neutrophils Relative %: 48 %
Platelets: 188 10*3/uL (ref 150–400)
RBC: 4.44 MIL/uL (ref 3.87–5.11)
RDW: 13 % (ref 11.5–15.5)
WBC: 5.2 10*3/uL (ref 4.0–10.5)
nRBC: 0 % (ref 0.0–0.2)

## 2021-08-30 LAB — BASIC METABOLIC PANEL
Anion gap: 4 — ABNORMAL LOW (ref 5–15)
BUN: 10 mg/dL (ref 6–20)
CO2: 26 mmol/L (ref 22–32)
Calcium: 9 mg/dL (ref 8.9–10.3)
Chloride: 112 mmol/L — ABNORMAL HIGH (ref 98–111)
Creatinine, Ser: 0.74 mg/dL (ref 0.44–1.00)
GFR, Estimated: >60 mL/min (ref >60)
Glucose, Bld: 110 mg/dL — ABNORMAL HIGH (ref 70–99)
Potassium: 4.4 mmol/L (ref 3.5–5.1)
Sodium: 142 mmol/L (ref 135–145)

## 2021-08-30 LAB — SEDIMENTATION RATE: Sed Rate: 1 mm/hr (ref 0–22)

## 2021-08-30 LAB — C-REACTIVE PROTEIN: CRP: 0.5 mg/dL (ref <1.0)

## 2021-08-30 MED ORDER — NITROGLYCERIN 1 MG/10 ML FOR IR/CATH LAB
INTRA_ARTERIAL | Status: AC
  Filled 2021-08-30: qty 10

## 2021-08-30 NOTE — ED Provider Triage Note (Signed)
Emergency Medicine Provider Triage Evaluation Note  Charline Hoskinson , a 56 y.o. female  was evaluated in triage.  Pt complains of back pain.  Chronic low back pain, no previous surgeries.  She is been having intermittent urinary incontinence for last 2 weeks.  Yesterday she had fecal incontinence and associated saddle anesthesia.  Denies any fevers or chills, ambulatory but having difficulties with her ADLs.  She has a bladder stimulator and this is not compatible with MRI.Marland Kitchen  Review of Systems  Per HPI  Physical Exam  BP 130/86   Pulse 66   Temp 98.5 F (36.9 C) (Oral)   Resp 16   Ht 5\' 3"  (1.6 m)   Wt 72 kg   SpO2 100%   BMI 28.12 kg/m  Gen:   Awake, no distress   Resp:  Normal effort  MSK:   Moves extremities without difficulty  Other:  Station light touch to lower extremities grossly intact.  Grip strength equal bilaterally, cranial nerves III through XII are grossly intact.  Able to raise both lower extremities but right lower extremity slightly weaker compared to left.  Dorsiflexion plantarflexion 5/5.  Rectal exam deferred.  Medical Decision Making  Medically screening exam initiated at 11:52 AM.  Appropriate orders placed.  Josue Kass was informed that the remainder of the evaluation will be completed by another provider, this initial triage assessment does not replace that evaluation, and the importance of remaining in the ED until their evaluation is complete.     Melina Modena, PA-C 08/30/21 1153

## 2021-08-30 NOTE — ED Triage Notes (Signed)
Patient with chronic back pain however noted bowel incontinence last night and on and off urinary incontinence. Aox4. Pt reports greater difficulty with performing ADLs, walking etc, moreso than usual.

## 2021-08-30 NOTE — ED Notes (Signed)
Pt given sandwich bag and water 

## 2021-08-30 NOTE — ED Provider Notes (Signed)
[facility] EMERGENCY DEPARTMENT Provider Note   CSN: [id number] Arrival date & time: 08/30/21  1104     History  Chief Complaint  Patient presents with   Back Pain   Encopresis    Cynthia Cohen is a 55 y.o. female.  Patient followed by [facility] internal medicine.  Patient with a history of chronic back pain.  Patient came in by POV.  Patient is under pain management.  Increased symptoms for the past 2 days to include some bowel incontinence on and off urinary incontinence increased weakness to the thigh area with pain radiating into both thighs.  Patient has baseline neuropathy with numbness to the bottom of the feet.  Now has some numbness to the top of both feet.  Patient has a longstanding bladder stimulator that was put in for bladder spasms many years ago therefore cannot have an MRI.  Past medical history significant for chronic back pain hypertension neuromuscular disorder diabetes.  Past surgical history significant for cholecystectomy gastric bypass bladder surgery bladder pacemaker not active at present.  Left total knee appendectomy.  Patient never smoked.       Home Medications Prior to Admission medications   Medication Sig Start Date End Date Taking? Authorizing Provider  albuterol (VENTOLIN HFA) 108 (90 Base) MCG/ACT inhaler Inhale 1-2 puffs into the lungs every 6 (six) hours as needed for wheezing or shortness of breath. 05/24/19   Lamptey, Myrene Galas, MD  benzonatate (TESSALON) 100 MG capsule Take 1 capsule (100 mg total) by mouth 3 (three) times daily as needed for cough. 05/24/19   Lamptey, Myrene Galas, MD  clonazePAM (KLONOPIN) 1 MG tablet Take 1 mg by mouth daily as needed for anxiety.     [provider]  gabapentin (NEURONTIN) 400 MG capsule Take 400 mg by mouth 2 (two) times a day. 06/13/18   [provider]  glucagon 1 MG injection Inject as instructed on the kit as needed for severe symptomatic low blood sugars 09/18/14    [provider]  lamoTRIgine (LAMICTAL) 200 MG tablet Take 200 mg by mouth at bedtime.     [provider]  lidocaine (LIDODERM) 5 % Place 1 patch onto the skin daily. Remove & Discard patch within 12 hours or as directed by MD 07/26/20   Tacy Learn, PA-C  Methylphenidate HCl ER, XR, 15 MG CP24 Take by mouth.    [provider]  methylPREDNISolone (MEDROL DOSEPAK) 4 MG TBPK tablet Follow pack directions 04/12/20   Tasia Catchings, Amy V, PA-C  NYSTATIN powder APPLY AA BID 11/16/18   [provider]  ondansetron (ZOFRAN-ODT) 4 MG disintegrating tablet Take 8 mg by mouth 2 (two) times daily. 01/05/19   [provider]  pantoprazole (PROTONIX) 20 MG tablet Take 20 mg by mouth daily. 02/05/19   [provider]  risperiDONE (RISPERDAL) 1 MG tablet Take by mouth. 02/15/19 03/17/19  [provider]  Vitamin D, Ergocalciferol, (DRISDOL) 1.25 MG (50000 UNIT) CAPS capsule Take 50,000 Units by mouth once a week. 02/03/19   [provider]  vortioxetine HBr (TRINTELLIX) 10 MG TABS tablet Take by mouth. 01/21/19   [provider]  ferrous sulfate 325 (65 FE) MG tablet Take 325 mg by mouth daily with breakfast.  05/24/19  [provider]  fluticasone (FLONASE) 50 MCG/ACT nasal spray Place 2 sprays into the nose daily.  06/06/12 05/24/19  [provider]      Allergies    Nitrofurantoin, Metoclopramide, and Tape  Review of Systems   Review of Systems  Constitutional:  Negative for chills and fever.  HENT:  Negative for ear pain and sore throat.   Eyes:  Negative for pain and visual disturbance.  Respiratory:  Negative for cough and shortness of breath.   Cardiovascular:  Negative for chest pain and palpitations.  Gastrointestinal:  Negative for abdominal pain and vomiting.  Genitourinary:  Negative for dysuria and hematuria.  Musculoskeletal:  Negative for arthralgias and back pain.  Skin:  Negative for color change and  rash.  Neurological:  Positive for weakness and numbness. Negative for seizures and syncope.  All other systems reviewed and are negative.   Physical Exam Updated Vital Signs BP 124/70   Pulse (!) 48   Temp 98.3 F (36.8 C) (Oral)   Resp 19   Ht 1.6 m ('5\' 3"' )   Wt 72 kg   SpO2 98%   BMI 28.12 kg/m  Physical Exam Vitals and nursing note reviewed.  Constitutional:      General: She is not in acute distress.    Appearance: Normal appearance. She is well-developed.  HENT:     Head: Normocephalic and atraumatic.  Eyes:     Extraocular Movements: Extraocular movements intact.     Conjunctiva/sclera: Conjunctivae normal.     Pupils: Pupils are equal, round, and reactive to light.  Cardiovascular:     Rate and Rhythm: Normal rate and regular rhythm.     Heart sounds: No murmur heard. Pulmonary:     Effort: Pulmonary effort is normal. No respiratory distress.     Breath sounds: Normal breath sounds.  Abdominal:     Palpations: Abdomen is soft.     Tenderness: There is no abdominal tenderness.  Genitourinary:    Comments: Sphincter stone is normal. Musculoskeletal:        General: No swelling.     Cervical back: Normal range of motion and neck supple.  Skin:    General: Skin is warm and dry.     Capillary Refill: Capillary refill takes less than 2 seconds.  Neurological:     Mental Status: She is alert and oriented to person, place, and time.     Sensory: Sensory deficit present.     Motor: Weakness present.     Comments: Weakness to both lower extremities more at the thigh level.  Toe strength dorsiflexion plantarflexion very strong.  Numbness to the top and bottom of the feet and across both thighs bilaterally.  Sphincter tone is normal.  Psychiatric:        Mood and Affect: Mood normal.     ED Results / Procedures / Treatments   Labs (all labs ordered are listed, but only abnormal results are displayed) Labs Reviewed  BASIC METABOLIC PANEL - Abnormal; Notable for  the following components:      Result Value   Chloride 112 (*)    Glucose, Bld 110 (*)    Anion gap 4 (*)    All other components within normal limits  CBC WITH DIFFERENTIAL/PLATELET  C-REACTIVE PROTEIN  SEDIMENTATION RATE    EKG None  Radiology No results found.  Procedures Procedures    Medications Ordered in ED Medications - No data to display  ED Course/ Medical Decision Making/ A&P                           Medical Decision Making Amount and/or Complexity of Data Reviewed Radiology: ordered.   Discussed  with on-call neurosurgery Dr. Annette Stable patient cannot have an MRI but he is recommending CT lumbar spine patient had a normal CT lumbar spine 2 years ago so it should be quite evident if there is any significant abnormalities.  If that is negative patient can be discharged home.  If there is any concerns raised on CT lumbar spine and recontact him.  Patient is already followed by pain management. Final Clinical Impression(s) / ED Diagnoses Final diagnoses:  Chronic bilateral low back pain with bilateral sciatica    Rx / DC Orders ED Discharge Orders     None         Fredia Sorrow, MD 08/30/21 2340

## 2021-08-31 ENCOUNTER — Emergency Department (HOSPITAL_COMMUNITY): Payer: Medicare Other

## 2021-08-31 MED ORDER — OXYCODONE-ACETAMINOPHEN 5-325 MG PO TABS
1.0000 | ORAL_TABLET | Freq: Once | ORAL | Status: AC
  Administered 2021-08-31: 1 via ORAL
  Filled 2021-08-31: qty 1

## 2021-08-31 NOTE — ED Notes (Signed)
Pt ambulated to and from bathroom without assistance 

## 2021-08-31 NOTE — ED Notes (Signed)
Patient verbalizes understanding of discharge instructions. Opportunity for questioning and answers were provided. Armband removed by staff, pt discharged from ED.  

## 2021-08-31 NOTE — ED Provider Notes (Signed)
Care assumed from Dr. Deretha Emory, patient with new incontinence and back pain pending CT lumbar spine (unable to have MRI scan because of implanted bladder stimulator).  CT scan shows no acute process, no evidence of cauda equina syndrome.  I have independently viewed the images, and agree with radiologist's interpretation.  Patient is discharged with instructions to follow-up with her pain management specialist, also referred to neurosurgery for second opinion.  Results for orders placed or performed during the hospital encounter of 08/30/21  CBC with Differential  Result Value Ref Range   WBC 5.2 4.0 - 10.5 K/uL   RBC 4.44 3.87 - 5.11 MIL/uL   Hemoglobin 12.5 12.0 - 15.0 g/dL   HCT 30.8 65.7 - 84.6 %   MCV 89.6 80.0 - 100.0 fL   MCH 28.2 26.0 - 34.0 pg   MCHC 31.4 30.0 - 36.0 g/dL   RDW 96.2 95.2 - 84.1 %   Platelets 188 150 - 400 K/uL   nRBC 0.0 0.0 - 0.2 %   Neutrophils Relative % 48 %   Neutro Abs 2.5 1.7 - 7.7 K/uL   Lymphocytes Relative 37 %   Lymphs Abs 1.9 0.7 - 4.0 K/uL   Monocytes Relative 6 %   Monocytes Absolute 0.3 0.1 - 1.0 K/uL   Eosinophils Relative 8 %   Eosinophils Absolute 0.4 0.0 - 0.5 K/uL   Basophils Relative 1 %   Basophils Absolute 0.1 0.0 - 0.1 K/uL   Immature Granulocytes 0 %   Abs Immature Granulocytes 0.02 0.00 - 0.07 K/uL  Basic metabolic panel  Result Value Ref Range   Sodium 142 135 - 145 mmol/L   Potassium 4.4 3.5 - 5.1 mmol/L   Chloride 112 (H) 98 - 111 mmol/L   CO2 26 22 - 32 mmol/L   Glucose, Bld 110 (H) 70 - 99 mg/dL   BUN 10 6 - 20 mg/dL   Creatinine, Ser 3.24 0.44 - 1.00 mg/dL   Calcium 9.0 8.9 - 40.1 mg/dL   GFR, Estimated >02 >72 mL/min   Anion gap 4 (L) 5 - 15  C-reactive protein  Result Value Ref Range   CRP 0.5 <1.0 mg/dL  Sedimentation rate  Result Value Ref Range   Sed Rate 1 0 - 22 mm/hr   CT Lumbar Spine Wo Contrast  Result Date: 08/31/2021 CLINICAL DATA:  Initial evaluation for low back pain, cauda equina syndrome  suspected. EXAM: CT LUMBAR SPINE WITHOUT CONTRAST TECHNIQUE: Multidetector CT imaging of the lumbar spine was performed without intravenous contrast administration. Multiplanar CT image reconstructions were also generated. RADIATION DOSE REDUCTION: This exam was performed according to the departmental dose-optimization program which includes automated exposure control, adjustment of the mA and/or kV according to patient size and/or use of iterative reconstruction technique. COMPARISON:  Radiograph from 09/06/2018. FINDINGS: Segmentation: Standard. Lowest well-formed disc space labeled the L5-S1 level. Alignment: Trace sigmoid scoliosis. 3 mm retrolisthesis of L2 on L3. Vertebrae: Vertebral body height maintained without acute or chronic fracture. Visualized sacrum and pelvis intact. No worrisome osseous lesions. Paraspinal and other soft tissues: Paraspinous soft tissues demonstrate no acute finding. Generator with electrodes partially visualize within the subcutaneous fat of the right lower back/gluteal region. Nonobstructive left renal nephrolithiasis. Postsurgical changes partially visualized about the stomach. Prior cholecystectomy noted. Disc levels: L1-2: Degenerative intervertebral disc space narrowing with disc bulge and disc desiccation. No spinal stenosis. Foramina remain patent. L2-3: Retrolisthesis. Mild diffuse disc bulge, slightly eccentric to the right. No spinal stenosis. Foramina remain patent. L3-4:  Mild diffuse disc bulge. Minimal facet hypertrophy. Resultant mild narrowing of the lateral recesses bilaterally. Central canal remains patent. No significant foraminal stenosis. L4-5: Mild disc bulge. Moderate facet hypertrophy. Resultant mild narrowing of the lateral recesses bilaterally. Central canal remains patent. Mild bilateral L4 foraminal stenosis. L5-S1: Diffuse disc bulge with endplate spurring. Moderate facet hypertrophy. Mild narrowing of the lateral recesses without significant spinal  stenosis. Mild bilateral L5 foraminal narrowing. IMPRESSION: 1. No acute abnormality within the lumbar spine. No high-grade spinal stenosis. 2. Mild disc bulging with facet hypertrophy at L3-4 through L5-S1 with resultant mild bilateral lateral recess stenosis. 3. Mild bilateral L4 and L5 foraminal narrowing related to disc bulge, endplate spurring, and facet hypertrophy. 4. Nonobstructive left renal nephrolithiasis. Electronically Signed   By: Rise Mu M.D.   On: 08/31/2021 01:54       Dione Booze, MD 08/31/21 0221

## 2021-08-31 NOTE — Discharge Instructions (Signed)
Your scan did not show any sign of injury to the spinal cord or cauda equina (nerves that leave the spinal cord and go through the lower part of your spine).  Please continue following up with your pain management specialist, but consider seeing the neurosurgeon for another opinion.  Return to the emergency department if you are having any problems.

## 2021-08-31 NOTE — ED Notes (Signed)
Per pt, hx bradycardia. HR normally low 40s.

## 2021-09-02 NOTE — Congregational Nurse Program (Signed)
  Dept: [phone]   Congregational Nurse Program Note  Date of Encounter: 09/02/2021  Past Medical History: Past Medical History:  Diagnosis Date   Anxiety    Arthritis    L knee    Asthma    rare exposure to smoke or other irritatnt    Back pain    Clostridium difficile diarrhea    Depression    Diabetes mellitus without complication (HCC)    in combination with hyperinsulinnemia    GERD (gastroesophageal reflux disease)    Hyperinsulinemia    Hypertension    Hypothyroidism    Neuromuscular disorder (HCC)    tourette syndrome, followed by neurology,neuropathy    Recurrent upper respiratory infection (URI)    Thyroid disease     Encounter Details:  CNP Questionnaire - 08/31/21 1645       Questionnaire   Do you give verbal consent to treat you today? 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 Says is being followed by her Internal Medicine Dr. In [place]. Will attempt resources. No other complaints. Follow-up prn.  Joya Gaskins, RN, BSN

## 2021-10-12 NOTE — Congregational Nurse Program (Signed)
  Dept: [phone]   Congregational Nurse Program Note  Date of Encounter: 10/12/2021  Past Medical History: Past Medical History:  Diagnosis Date   Anxiety    Arthritis    L knee    Asthma    rare exposure to smoke or other irritatnt    Back pain    Clostridium difficile diarrhea    Depression    Diabetes mellitus without complication ([place])    in combination with hyperinsulinnemia    GERD (gastroesophageal reflux disease)    Hyperinsulinemia    Hypertension    Hypothyroidism    Neuromuscular disorder ([place])    tourette syndrome, followed by neurology,neuropathy    Recurrent upper respiratory infection (URI)    Thyroid disease     Encounter Details:  CNP Questionnaire - 10/12/21 1550       Questionnaire   Do you give verbal consent to treat you today? Yes    Location Patient [place] or Organization    Patient Status Homeless    Insurance Medicare;Medicaid    Insurance Referral N/A    Medication N/A    Medical Provider Yes    Screening Referrals N/A    Medical Referral N/A    Medical Appointment Made N/A    Food Have Food Insecurities    Transportation N/A    Housing/Utilities No permanent housing    Chiropractor N/A    Intervention Advocate;Case Management            Met again today to review need for more wound supplies. Some supplies given last month. Will investigate future resource options.   Volney Presser, RN, BSN

## 2021-11-04 NOTE — Congregational Nurse Program (Signed)
  Dept: [phone]   Congregational Nurse Program Note  Date of Encounter: 11/04/2021  Past Medical History: Past Medical History:  Diagnosis Date   Anxiety    Arthritis    L knee    Asthma    rare exposure to smoke or other irritatnt    Back pain    Clostridium difficile diarrhea    Depression    Diabetes mellitus without complication ([place])    in combination with hyperinsulinnemia    GERD (gastroesophageal reflux disease)    Hyperinsulinemia    Hypertension    Hypothyroidism    Neuromuscular disorder ([place])    tourette syndrome, followed by neurology,neuropathy    Recurrent upper respiratory infection (URI)    Thyroid disease     Encounter Details:  CNP Questionnaire - 11/04/21 1023       Questionnaire   Do you give verbal consent to treat you today? Yes    Location Patient [place] or Organization    Patient Status Homeless    Insurance Medicare;Medicaid    Insurance Referral N/A    Medication N/A    Medical Provider Yes    Screening Referrals N/A    Medical Referral N/A    Medical Appointment Made N/A    Food Have Food Insecurities    Transportation N/A    Housing/Utilities No permanent housing    Chiropractor N/A    Intervention Advocate;Case Management    ED Visit Averted N/A    Life-Saving Intervention Made N/A             Spoke with Kenney Houseman, Social Worker at [facility]. Family will be moving out of facility soon since they were able to find a more permanent home. Gave pt. more wound supplies for her fistula on 10/21/21. Will close out case management soon.  Volney Presser, RN, BSN

## 2021-11-23 ENCOUNTER — Ambulatory Visit: Payer: Medicare Other | Admitting: Family Medicine

## 2021-12-29 ENCOUNTER — Ambulatory Visit
Admission: RE | Admit: 2021-12-29 | Discharge: 2021-12-29 | Disposition: A | Discharge status: Home / Self Care | Payer: Medicare Other | Source: Ambulatory Visit | Attending: Emergency Medicine | Admitting: Emergency Medicine

## 2021-12-29 VITALS — BP 125/67 | HR 60 | Temp 98.1°F | Resp 16

## 2021-12-29 DIAGNOSIS — N3000 Acute cystitis without hematuria: Secondary | ICD-10-CM | POA: Insufficient documentation

## 2021-12-29 LAB — POCT URINALYSIS DIP (MANUAL ENTRY)
Bilirubin, UA: NEGATIVE
Blood, UA: NEGATIVE
Glucose, UA: NEGATIVE mg/dL
Ketones, POC UA: NEGATIVE mg/dL
Leukocytes, UA: NEGATIVE
Nitrite, UA: NEGATIVE
Protein Ur, POC: NEGATIVE mg/dL
Spec Grav, UA: 1.01 (ref 1.010–1.025)
Urobilinogen, UA: 0.2 E.U./dL
pH, UA: 6 (ref 5.0–8.0)

## 2021-12-29 MED ORDER — FLUCONAZOLE 150 MG PO TABS: ORAL_TABLET | ORAL | 2 refills | Status: DC

## 2021-12-29 MED ORDER — SULFAMETHOXAZOLE-TRIMETHOPRIM 800-160 MG PO TABS: 1.0000 | ORAL_TABLET | Freq: Two times a day (BID) | ORAL | 0 refills | Status: AC

## 2021-12-29 NOTE — ED Provider Notes (Signed)
[facility]    CSN: [id number] Arrival date & time: 12/29/21  8938    HISTORY   Chief Complaint  Patient presents with   Urinary Frequency    Suspect UTI - Entered by patient   HPI Cynthia Cohen is a pleasant, 56 y.o. female who presents to urgent care today. Patient complains of dark-colored urine increased frequency of urination and increased urge to urinate for the past 2 days.  Patient denies fever, body aches, chills, abdominal pain, flank pain, abnormal vaginal discharge, vaginal irritation, urine malodor.  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  Patient states she has a history of frequent urinary tract infections in the past.  EMR reviewed briefly, going back to 2020, patient has had multiple urine cultures positive for small amounts of E. coli.  Patient states she is not taking any medications to alleviate her symptoms.  The history is provided by the patient.   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   Chronic rhinitis 02/25/2019   Adverse food reaction 02/25/2019   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   Past Surgical History:  Procedure Laterality Date   ABDOMINAL SURGERY      ADENOIDECTOMY     APPENDECTOMY     BLADDER SURGERY     bladder tumor removed- 1997,bladder pacemaker - not active at the present time    BOWEL RESECTION     CARDIAC CATHETERIZATION     done for stress related event, in [place] , [place]- told that every thing was ok, punctured femoral artery- treated /w Plasma    CERVICAL ABLATION     CHOLECYSTECTOMY     EXCISIONAL TOTAL KNEE ARTHROPLASTY Left 12/09/2013   Procedure: POLYEXCHANGE WITH SYNOVECTOMY;  Surgeon: Vickey Huger, MD;  Location: [facility];  Service: Orthopedics;  Laterality: Left;   GASTRIC BYPASS     HERNIA REPAIR     ILEOSTOMY REVISION     REVISION TOTAL KNEE ARTHROPLASTY Left 12/09/2013   dr Ronnie Derby   TONSILLECTOMY     TOTAL KNEE ARTHROPLASTY Left 2014   at [facility] History   No obstetric history on file.    Home Medications    Prior to Admission medications   Medication Sig Start Date End Date Taking? Authorizing Provider  fluconazole (DIFLUCAN) 150 MG tablet Take 1 tablet on first day of treatment for vaginal yeast infection.  Take second tablet 3 days after first tablet. 12/29/21  Yes Lynden Oxford Scales, PA-C  sulfamethoxazole-trimethoprim (BACTRIM DS) 800-160 MG tablet Take 1 tablet by mouth 2 (two) times daily for 5 days. 12/29/21 01/03/22 Yes  Lynden Oxford Scales, PA-C  albuterol (VENTOLIN HFA) 108 (90 Base) MCG/ACT inhaler Inhale 1-2 puffs into the lungs every 6 (six) hours as needed for wheezing or shortness of breath. 05/24/19   Lamptey, Myrene Galas, MD  clonazePAM (KLONOPIN) 1 MG tablet Take 1 mg by mouth daily as needed for anxiety.     [provider]  gabapentin (NEURONTIN) 400 MG capsule Take 400 mg by mouth 2 (two) times a day. 06/13/18   [provider]  glucagon 1 MG injection Inject as instructed on the kit as needed for severe symptomatic low blood sugars 09/18/14   [provider]  lamoTRIgine (LAMICTAL) 200 MG tablet Take 200 mg by mouth at bedtime.     [provider]   Methylphenidate HCl ER, XR, 15 MG CP24 Take by mouth.    [provider]  ondansetron (ZOFRAN-ODT) 4 MG disintegrating tablet Take 8 mg by mouth 2 (two) times daily. 01/05/19   [provider]  pantoprazole (PROTONIX) 20 MG tablet Take 20 mg by mouth daily. 02/05/19   [provider]  risperiDONE (RISPERDAL) 1 MG tablet Take by mouth. 02/15/19 03/17/19  [provider]  Vitamin D, Ergocalciferol, (DRISDOL) 1.25 MG (50000 UNIT) CAPS capsule Take 50,000 Units by mouth once a week. Patient not taking: Reported on 12/29/2021 02/03/19   [provider]  vortioxetine HBr (TRINTELLIX) 10 MG TABS tablet Take by mouth. 01/21/19   [provider]  ferrous sulfate 325 (65 FE) MG tablet Take 325 mg by mouth daily with breakfast.  05/24/19  [provider]  fluticasone (FLONASE) 50 MCG/ACT nasal spray Place 2 sprays into the nose daily.  06/06/12 05/24/19  [provider]    Family History Family History  Problem Relation Age of Onset   Diabetes Mother    Hypertension Mother    Allergic rhinitis Mother    Aneurysm Mother    Anxiety disorder Father    Alcohol abuse Father    Parkinson's disease Father    Social History Social History   Tobacco Use   Smoking status: Passive Smoke Exposure - Never Smoker   Smokeless tobacco: Never   Tobacco comments:    in clients' home.   Vaping Use   Vaping Use: Never used  Substance Use Topics   Alcohol use: No   Drug use: No   Allergies   Nitrofurantoin, Metoclopramide, and Tape  Review of Systems Review of Systems Pertinent findings revealed after performing a 14 point review of systems has been noted in the history of present illness.  Physical Exam Triage Vital Signs ED Triage Vitals  Enc Vitals Group     BP 12/04/20 0827 (!) 147/82     Pulse Rate 12/04/20 0827 72     Resp 12/04/20 0827 18     Temp 12/04/20 0827 98.3 F (36.8 C)     Temp Source 12/04/20 0827 Oral     SpO2  12/04/20 0827 98 %     Weight --      Height --      Head Circumference --      Peak Flow --      Pain Score 12/04/20 0826 5     Pain Loc --      Pain Edu? --      Excl. in [place]? --   No data found.  Updated Vital Signs BP 125/67   Pulse 60   Temp 98.1 F (36.7 C) (Oral)   Resp 16  SpO2 97%   Physical Exam Vitals and nursing note reviewed.  Constitutional:      General: She is not in acute distress.    Appearance: Normal appearance. She is not ill-appearing.  HENT:     Head: Normocephalic and atraumatic.  Eyes:     General: Lids are normal.        Right eye: No discharge.        Left eye: No discharge.     Extraocular Movements: Extraocular movements intact.     Conjunctiva/sclera: Conjunctivae normal.     Right eye: Right conjunctiva is not injected.     Left eye: Left conjunctiva is not injected.  Neck:     Trachea: Trachea and phonation normal.  Cardiovascular:     Rate and Rhythm: Normal rate and regular rhythm.     Pulses: Normal pulses.     Heart sounds: Normal heart sounds. No murmur heard.    No friction rub. No gallop.  Pulmonary:     Effort: Pulmonary effort is normal. No accessory muscle usage, prolonged expiration or respiratory distress.     Breath sounds: Normal breath sounds. No stridor, decreased air movement or transmitted upper airway sounds. No decreased breath sounds, wheezing, rhonchi or rales.  Chest:     Chest wall: No tenderness.  Abdominal:     General: Abdomen is flat. Bowel sounds are normal. There is no distension.     Palpations: Abdomen is soft.     Tenderness: There is no abdominal tenderness. There is no right CVA tenderness or left CVA tenderness.     Hernia: No hernia is present.  Musculoskeletal:        General: Normal range of motion.     Cervical back: Normal range of motion and neck supple. Normal range of motion.  Lymphadenopathy:     Cervical: No cervical adenopathy.  Skin:    General: Skin is warm and dry.     Findings:  No erythema or rash.  Neurological:     General: No focal deficit present.     Mental Status: She is alert and oriented to person, place, and time.  Psychiatric:        Mood and Affect: Mood normal.        Behavior: Behavior normal.     Visual Acuity Right Eye Distance:   Left Eye Distance:   Bilateral Distance:    Right Eye Near:   Left Eye Near:    Bilateral Near:     [facility] / Diagnostics / Procedures:   Clinical Course as of 12/29/21 [place]  Wed Dec 29, 2021  0953 POCT urinalysis dipstick [LM]    Clinical Course User Index [LM] Lynden Oxford Scales, PA-C    Radiology No results found.  Procedures Procedures (including critical care time) EKG  Pending results:  Labs Reviewed  URINE CULTURE  POCT URINALYSIS DIP (MANUAL ENTRY)    Medications Ordered in UC: Medications - No data to display  UC Diagnoses / Final Clinical Impressions(s)   I have reviewed the triage vital signs and the nursing notes.  Pertinent labs & imaging results that were available during my care of the patient were reviewed by me and considered in my medical decision making (see chart for details).    Final diagnoses:  Acute cystitis without hematuria   Patient was advised to begin antibiotics today due to having active symptoms of urinary tract infection.  Patient advised that they will be contacted with results and that adjustments to treatment will be provided as indicated based on the results.   Patient was advised of possibility that urine culture results may be negative if sample provided was obtained late in the day causing urine to be more diluted.  Patient was advised that if antibiotics were effective after the first 24 to 36 hours, despite negative urine culture result, it is recommended that they complete the full course as prescribed.   Diflucan prescribed for inevitable vaginal yeast infection caused by antibiotic therapy. Return precautions advised.  ED  Prescriptions     Medication Sig Dispense Auth. Provider   sulfamethoxazole-trimethoprim (BACTRIM DS) 800-160 MG tablet Take 1 tablet by mouth 2 (two) times daily for 5 days. 10 tablet Lynden Oxford Scales, PA-C   fluconazole (DIFLUCAN) 150 MG tablet Take 1 tablet on first day of treatment for vaginal yeast infection.  Take second tablet 3 days after first tablet. 2 tablet Lynden Oxford Scales, PA-C      PDMP not reviewed this encounter.  Disposition Upon Discharge:  Condition: stable for discharge home  Patient presented with concern for an acute illness with associated systemic symptoms and significant discomfort requiring urgent management. In my opinion, this is a condition that a prudent lay person (someone who possesses an average knowledge of health and medicine) may potentially expect to result in complications if not addressed urgently such as respiratory distress, impairment of bodily function or dysfunction of bodily organs.   As such, the patient has been evaluated and assessed, work-up was performed and treatment was provided in alignment with urgent care protocols and evidence based medicine.  Patient/parent/caregiver has been advised that the patient may require follow up for further testing and/or treatment if the symptoms continue in spite of treatment, as clinically indicated and appropriate.  Routine symptom specific, illness specific and/or disease specific instructions were discussed with the patient and/or caregiver at length.  Prevention strategies for avoiding STD exposure were also discussed.  The patient will follow up with their current PCP if and as advised. If the patient does not currently have a PCP we will assist them in obtaining one.   The patient may need specialty follow up if the symptoms continue, in spite of conservative treatment and management, for further workup, evaluation, consultation and treatment as clinically indicated and  appropriate.  Patient/parent/caregiver verbalized understanding and agreement of plan as discussed.  All questions were addressed during visit.  Please see discharge instructions below for further details of plan.  Discharge Instructions:   Discharge Instructions      The urinalysis that we performed in the clinic today was essentially normal.  I suspect that this is likely due to your being extremely well-hydrated at all times.  Urine culture will be performed per our protocol.  The result of the urine culture will be available in the next 3 to 5 days and will be posted to your MyChart account.  If there is an abnormal finding, you will be contacted by phone and advised of further treatment recommendations, if any.   You were advised to begin antibiotics today because you are having active symptoms of an acute lower urinary tract infection also known as cystitis.     Please pick up and begin taking your prescription for Bactrim DS (trimethoprim sulfamethoxazole) as soon as possible.  Please take all doses exactly as prescribed.  You can take this medication with or without food.  This medication is  safe to take with your other medications.   If you receive a phone call advising you that your urine culture is negative but you begin to feel better after taking antibiotics for 24 hours, please feel free to complete the full 5-day course of antibiotics as they were likely needed and the urine culture result was falsely negative.    If your culture is negative and you do not feel any better, please return for repeat evaluation of other possible causes of your symptoms.   Please consider abstaining from sexual intercourse while you are being treated for urinary tract infection.   Because we know that antibiotic treatment can often cause vaginal yeast infections, I have also provided you with a prescription for fluconazole (Diflucan).  Please take the first tablet the day you begin to experience  symptoms of vaginal yeast infection which include vaginal itching and thick white vaginal discharge.  Please take the second tablet three days after the first tablet to ensure resolution of the yeast infection   If you have not had complete resolution of your symptoms after completing treatment as prescribed, please return to urgent care for repeat evaluation or follow-up with your primary care provider.  Repeat urinalysis and urine culture may be indicated for more directed therapy.   Thank you for visiting urgent care today.  I appreciate the opportunity to participate in your care.     This office note has been dictated using Museum/gallery curator.  Unfortunately, this method of dictation can sometimes lead to typographical or grammatical errors.  I apologize for your inconvenience in advance if this occurs.  Please do not hesitate to reach out to me if clarification is needed.       Lynden Oxford Scales, [place] 12/29/21 [phone]

## 2021-12-29 NOTE — ED Triage Notes (Signed)
Pt reports urine frequency and dysuria x 2 days. Denies chills, fever, and abd pain. Reports HX of UTI. Reports urine is dark yellow.  OTC medication for symptoms: None

## 2021-12-29 NOTE — Discharge Instructions (Addendum)
The urinalysis that we performed in the clinic today was essentially normal.  I suspect that this is likely due to your being extremely well-hydrated at all times.  Urine culture will be performed per our protocol.  The result of the urine culture will be available in the next 3 to 5 days and will be posted to your MyChart account.  If there is an abnormal finding, you will be contacted by phone and advised of further treatment recommendations, if any.   You were advised to begin antibiotics today because you are having active symptoms of an acute lower urinary tract infection also known as cystitis.     Please pick up and begin taking your prescription for Bactrim DS (trimethoprim sulfamethoxazole) as soon as possible.  Please take all doses exactly as prescribed.  You can take this medication with or without food.  This medication is safe to take with your other medications.   If you receive a phone call advising you that your urine culture is negative but you begin to feel better after taking antibiotics for 24 hours, please feel free to complete the full 5-day course of antibiotics as they were likely needed and the urine culture result was falsely negative.    If your culture is negative and you do not feel any better, please return for repeat evaluation of other possible causes of your symptoms.   Please consider abstaining from sexual intercourse while you are being treated for urinary tract infection.   Because we know that antibiotic treatment can often cause vaginal yeast infections, I have also provided you with a prescription for fluconazole (Diflucan).  Please take the first tablet the day you begin to experience symptoms of vaginal yeast infection which include vaginal itching and thick white vaginal discharge.  Please take the second tablet three days after the first tablet to ensure resolution of the yeast infection   If you have not had complete resolution of your symptoms after  completing treatment as prescribed, please return to urgent care for repeat evaluation or follow-up with your primary care provider.  Repeat urinalysis and urine culture may be indicated for more directed therapy.   Thank you for visiting urgent care today.  I appreciate the opportunity to participate in your care.

## 2021-12-30 LAB — URINE CULTURE: Culture: NO GROWTH

## 2022-01-09 ENCOUNTER — Ambulatory Visit
Admission: RE | Admit: 2022-01-09 | Discharge: 2022-01-09 | Disposition: A | Discharge status: Home / Self Care | Payer: Medicare Other | Source: Ambulatory Visit

## 2022-01-09 VITALS — BP 137/83 | Temp 98.8°F | Resp 16

## 2022-01-09 DIAGNOSIS — B009 Herpesviral infection, unspecified: Secondary | ICD-10-CM

## 2022-01-09 DIAGNOSIS — B0089 Other herpesviral infection: Secondary | ICD-10-CM

## 2022-01-09 MED ORDER — VALACYCLOVIR HCL 1 G PO TABS: 2000.0000 mg | ORAL_TABLET | Freq: Two times a day (BID) | ORAL | 0 refills | Status: AC

## 2022-01-09 NOTE — ED Provider Notes (Signed)
[facility]    CSN: [id number] Arrival date & time: 01/09/22  1220    HISTORY   Chief Complaint  Patient presents with   Blister    Cold sore in blister phase - Entered by patient   Appointment    1245   HPI Cynthia Cohen is a pleasant, 56 y.o. female who presents to urgent care today. Patient complains of a 1 day history of a blister on her lip concerning for a cold sore.  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  Patient is requesting medication for treatment.  Patient denies odynophagia, fever, body aches, chills.  The history is provided by the patient.   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   Chronic rhinitis 02/25/2019   Adverse food reaction 02/25/2019   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   Past Surgical History:  Procedure Laterality Date   ABDOMINAL SURGERY     ADENOIDECTOMY     APPENDECTOMY     BLADDER SURGERY     bladder tumor removed- 1997,bladder pacemaker - not active at the present time    BOWEL RESECTION     CARDIAC CATHETERIZATION     done for stress related event, in [place] , [place]- told that every thing was ok, punctured femoral artery- treated /w Plasma    CERVICAL ABLATION     CHOLECYSTECTOMY     EXCISIONAL TOTAL KNEE ARTHROPLASTY Left 12/09/2013    Procedure: POLYEXCHANGE WITH SYNOVECTOMY;  Surgeon: Dannielle Huh, MD;  Location: [facility] OR;  Service: Orthopedics;  Laterality: Left;   GASTRIC BYPASS     HERNIA REPAIR     ILEOSTOMY REVISION     REVISION TOTAL KNEE ARTHROPLASTY Left 12/09/2013   dr Sherlean Foot   TONSILLECTOMY     TOTAL KNEE ARTHROPLASTY Left 2014   at [facility] History   No obstetric history on file.    Home Medications    Prior to Admission medications   Medication Sig Start Date End Date Taking? Authorizing Provider  desvenlafaxine (PRISTIQ) 50 MG 24 hr tablet Take 50 mg by mouth daily.   Yes [provider]  albuterol (VENTOLIN HFA) 108 (90 Base) MCG/ACT inhaler Inhale 1-2 puffs into the lungs every 6 (six) hours as needed for wheezing or shortness of breath. 05/24/19   Lamptey, Britta Mccreedy, MD  clonazePAM (KLONOPIN) 1 MG tablet Take 1 mg by mouth daily as needed for anxiety.     [provider]  gabapentin (NEURONTIN) 400 MG capsule Take 400 mg by mouth 2 (two) times a day. 06/13/18   [provider]  lamoTRIgine (LAMICTAL) 200 MG tablet Take 200 mg by mouth at bedtime.     [provider]  pantoprazole (PROTONIX) 20 MG tablet Take 20 mg by mouth daily. 02/05/19   [provider]  ferrous sulfate 325 (65 FE) MG tablet Take 325 mg by mouth daily with breakfast.  05/24/19  [provider]  fluticasone (FLONASE) 50 MCG/ACT nasal spray Place 2 sprays into the nose daily.  06/06/12 05/24/19  [provider]    Family History Family History  Problem Relation Age of Onset   Diabetes Mother    Hypertension Mother    Allergic rhinitis Mother    Aneurysm Mother    Anxiety disorder Father    Alcohol abuse Father    Parkinson's disease Father    Social History Social History   Tobacco Use   Smoking status: Passive Smoke Exposure - Never Smoker   Smokeless tobacco: Never   Tobacco comments:    in clients' home.   Vaping Use   Vaping Use: Never used  Substance Use  Topics   Alcohol use: No   Drug use: No   Allergies   Nitrofurantoin, Metoclopramide, Nitrofurantoin macrocrystal, and Tape  Review of Systems Review of Systems Pertinent findings revealed after performing a 14 point review of systems has been noted in the history of present illness.  Physical Exam Triage Vital Signs ED Triage Vitals  Enc Vitals Group     BP 12/04/20 0827 (!) 147/82     Pulse Rate 12/04/20 0827 72     Resp 12/04/20 0827 18     Temp 12/04/20 0827 98.3 F (36.8 C)     Temp Source 12/04/20 0827 Oral     SpO2 12/04/20 0827 98 %     Weight --      Height --      Head Circumference --      Peak Flow --      Pain Score 12/04/20 0826 5     Pain Loc --      Pain Edu? --      Excl. in GC? --   No data found.  Updated Vital Signs BP 137/83 (BP Location: Right Arm)   Temp 98.8 F (37.1 C) (Oral)   Resp 16   SpO2 98%   Physical Exam Vitals and nursing note reviewed.  Constitutional:      General: She is not in acute distress.    Appearance: Normal appearance.  HENT:     Head: Normocephalic and atraumatic.  Eyes:     Pupils: Pupils are equal, round, and reactive to light.  Cardiovascular:     Rate and Rhythm: Normal rate and regular rhythm.  Pulmonary:     Effort: Pulmonary effort is normal.     Breath sounds: Normal breath sounds.  Musculoskeletal:        General: Normal range of motion.     Cervical back: Normal range of motion and neck supple.  Skin:    General: Skin is warm and dry.     Findings: Lesion (Cluster of vesicular, clear fluid-filled lesions on erythematous base at right lateral aspect of upper lip) present.  Neurological:     General: No focal deficit present.     Mental Status: She is alert and oriented to person, place, and time. Mental status is at baseline.  Psychiatric:        Mood and Affect: Mood normal.        Behavior: Behavior normal.        Thought Content: Thought content normal.        Judgment:  Judgment normal.      Visual Acuity Right Eye Distance:   Left Eye Distance:   Bilateral Distance:    Right Eye Near:   Left Eye Near:    Bilateral Near:     UC Couse / Diagnostics / Procedures:     Radiology No results found.  Procedures Procedures (including critical care time) EKG  Pending results:  Labs Reviewed - No data to display  Medications Ordered in UC: Medications - No data to display  UC Diagnoses / Final Clinical Impressions(s)   I have reviewed the triage vital signs and the nursing notes.  Pertinent labs & imaging results that were available during my care of the patient were reviewed by me and considered in my medical decision making (see chart for details).    Final diagnoses:  HSV-1 (herpes simplex virus 1) infection  Recurrent oral herpes simplex infection   Patient provided with a prescription for valacyclovir 2 g twice daily for 1 day.  Patient advised to reach out to her primary care provider to be sure that she has refills on hand so there is no delay in care should another lesion occur.  Return precautions advised.  ED Prescriptions     Medication Sig Dispense Auth. Provider   valACYclovir (VALTREX) 1000 MG tablet Take 2 tablets (2,000 mg total) by mouth 2 (two) times daily for 1 day. 4 tablet Theadora RamaMorgan, Lynne Takemoto Scales, PA-C      PDMP not reviewed this encounter.  Pending results:  Labs Reviewed - No data to display  Discharge Instructions:   Discharge Instructions      Recommend treatment for recurrent oral herpes infection is valacyclovir 2 g twice daily for 1 day.  I have sent a prescription to pharmacy.  Please follow-up with your primary care provider to make sure that you have refills on hand as prompt treatment ensures prompt resolution.  Thank you for visiting urgent care today.      Disposition Upon Discharge:  Condition: stable for discharge home  Patient presented with an acute illness with associated systemic symptoms and  significant discomfort requiring urgent management. In my opinion, this is a condition that a prudent lay person (someone who possesses an average knowledge of health and medicine) may potentially expect to result in complications if not addressed urgently such as respiratory distress, impairment of bodily function or dysfunction of bodily organs.   Routine symptom specific, illness specific and/or disease specific instructions were discussed with the patient and/or caregiver at length.   As such, the patient has been evaluated and assessed, work-up was performed and treatment was provided in alignment with urgent care protocols and evidence based medicine.  Patient/parent/caregiver has been advised that the patient may require follow up for further testing and treatment if the symptoms continue in spite of treatment, as clinically indicated and appropriate.  Patient/parent/caregiver [facility] been [facility] if no better; to PCP or the Emergency Department if new signs and symptoms develop, or if the current signs or symptoms continue to change or worsen for further workup, evaluation and treatment as clinically indicated and appropriate  The patient will follow up with their current PCP if and as advised. If the patient does not currently have a PCP we will assist them in obtaining one.   The patient may need specialty follow up if the symptoms continue, in spite of conservative treatment and management, for further workup, evaluation, consultation and treatment as clinically indicated and appropriate.  Patient/parent/caregiver verbalized understanding and agreement of plan as discussed.  All questions were addressed during visit.  Please see discharge instructions below for further details of plan.  This office note has been dictated using Teaching laboratory technician.  Unfortunately, this method of dictation can sometimes lead to typographical or grammatical errors.  I  apologize for your inconvenience in advance if this occurs.  Please do not hesitate to reach out to me if clarification is needed.      Theadora Rama Scales, PA-C 01/09/22 1939

## 2022-01-09 NOTE — ED Triage Notes (Signed)
Pt reports a cold sore x 1 day.

## 2022-01-09 NOTE — Discharge Instructions (Signed)
Recommend treatment for recurrent oral herpes infection is valacyclovir 2 g twice daily for 1 day.  I have sent a prescription to pharmacy.  Please follow-up with your primary care provider to make sure that you have refills on hand as prompt treatment ensures prompt resolution.  Thank you for visiting urgent care today.

## 2023-07-04 ENCOUNTER — Other Ambulatory Visit (HOSPITAL_COMMUNITY): Attending: Psychiatry | Admitting: Licensed Clinical Social Worker

## 2023-07-04 ENCOUNTER — Telehealth (HOSPITAL_COMMUNITY): Payer: Self-pay | Admitting: Psychiatry

## 2023-07-04 DIAGNOSIS — F4310 Post-traumatic stress disorder, unspecified: Secondary | ICD-10-CM | POA: Insufficient documentation

## 2023-07-04 DIAGNOSIS — F332 Major depressive disorder, recurrent severe without psychotic features: Secondary | ICD-10-CM | POA: Insufficient documentation

## 2023-07-04 DIAGNOSIS — F411 Generalized anxiety disorder: Secondary | ICD-10-CM | POA: Diagnosis not present

## 2023-07-04 DIAGNOSIS — F331 Major depressive disorder, recurrent, moderate: Secondary | ICD-10-CM | POA: Insufficient documentation

## 2023-07-04 NOTE — Telephone Encounter (Signed)
 D:  Ronda Cocks, LCSW referred pt to virtual [facility]-IOP.  A:  Placed call to pt, but there was no answer.  Left vm requesting pt to call case manager back.  Inform Claudia.
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
## 2023-07-05 ENCOUNTER — Telehealth (HOSPITAL_COMMUNITY): Payer: Self-pay | Admitting: Psychiatry

## 2023-07-05 NOTE — Telephone Encounter (Signed)
 D:  Patient called re: MH-IOP.  Pt states she plans to discuss the program with her therapist first.  "I need to make sure that I can afford the program."  A:  Oriented pt.  Informed pt that [facility] does payment plans also.  Also mentioned [facility] to pt.  Pt states she will call the case manager back.  R:  Pt receptive.

## 2023-07-10 ENCOUNTER — Telehealth (HOSPITAL_COMMUNITY): Payer: Self-pay | Admitting: Psychiatry

## 2023-07-13 ENCOUNTER — Telehealth (HOSPITAL_COMMUNITY): Payer: Self-pay | Admitting: Psychiatry

## 2023-07-13 NOTE — Telephone Encounter (Signed)
 D:  Pt called the MH-IOP Case Mgr with various questions about MH-IOP.  "My therapist wanted to know if the group used CBT or DBT modalities?  Also, would a treatment plan be done?  I have PTSD and strong dissociative symptoms.  I am stable though." A:  Provided pt with support.  Answered all her questions/concerns.  Encouraged her to tell her therapist that she can call the case manager once pt signs a ROI so she can answer all her questions/concerns.  R:  Pt receptive.

## 2023-07-18 ENCOUNTER — Telehealth (HOSPITAL_COMMUNITY): Payer: Self-pay | Admitting: Psychiatry

## 2023-07-18 ENCOUNTER — Encounter (HOSPITAL_COMMUNITY): Payer: Self-pay | Admitting: Psychiatry

## 2023-07-18 ENCOUNTER — Other Ambulatory Visit (HOSPITAL_COMMUNITY): Attending: Psychiatry | Admitting: Psychiatry

## 2023-07-18 ENCOUNTER — Encounter (HOSPITAL_COMMUNITY): Payer: Self-pay

## 2023-07-18 DIAGNOSIS — F952 Tourette's disorder: Secondary | ICD-10-CM | POA: Diagnosis not present

## 2023-07-18 DIAGNOSIS — G4700 Insomnia, unspecified: Secondary | ICD-10-CM | POA: Diagnosis not present

## 2023-07-18 DIAGNOSIS — Z9151 Personal history of suicidal behavior: Secondary | ICD-10-CM | POA: Insufficient documentation

## 2023-07-18 DIAGNOSIS — F909 Attention-deficit hyperactivity disorder, unspecified type: Secondary | ICD-10-CM | POA: Diagnosis not present

## 2023-07-18 DIAGNOSIS — F4310 Post-traumatic stress disorder, unspecified: Secondary | ICD-10-CM | POA: Diagnosis not present

## 2023-07-18 DIAGNOSIS — Z79899 Other long term (current) drug therapy: Secondary | ICD-10-CM | POA: Diagnosis not present

## 2023-07-18 DIAGNOSIS — F411 Generalized anxiety disorder: Secondary | ICD-10-CM

## 2023-07-18 DIAGNOSIS — F331 Major depressive disorder, recurrent, moderate: Secondary | ICD-10-CM | POA: Insufficient documentation

## 2023-07-18 DIAGNOSIS — F332 Major depressive disorder, recurrent severe without psychotic features: Secondary | ICD-10-CM

## 2023-07-18 NOTE — Progress Notes (Signed)
 Virtual Visit via Video Note   I connected with Cynthia Cohen on 07/18/23 at  9:00 AM EDT by a video enabled telemedicine application and verified that I am speaking with the correct person using two identifiers.   At orientation to the IOP program, Case Manager discussed the limitations of evaluation and management by telemedicine and the availability of in person appointments. The patient expressed understanding and agreed to proceed with virtual visits throughout the duration of the program.   Location:  Patient: Patient Home Provider: [facility] BH Office   History of Present Illness: MDD, GAD, PTSD   Observations/Objective: Check In: Case Manager checked in with all participants to review discharge dates, insurance authorizations, work-related documents and needs from the treatment team regarding medications. Cynthia Cohen stated needs and engaged in discussion.    Initial Therapeutic Activity: Counselor facilitated a check-in with Cynthia Cohen to assess for safety, sobriety and medication compliance.  Counselor also inquired about Cynthia Cohen's current emotional ratings, as well as any significant changes in thoughts, feelings or behavior since previous check in.  Cynthia Cohen presented for session on time and was alert, oriented x5, with no evidence or self-report of active SI/HI or A/V H.  Cynthia Cohen reported compliance with medication and denied use of alcohol or illicit substances.  Cynthia Cohen reported scores of 4/10 for depression, 6/10 for anxiety, and 6/10 for anger/irritability.  Cynthia Cohen denied any recent outbursts.  Cynthia Cohen reported that a struggle has been dealing with depression, PTSD symptoms, and panic attacks recently, which led her to enroll in [facility] for help.  Cynthia Cohen reported that they are in the process of adopting too, which has been difficult due to issues with DSS.  Cynthia Cohen reported that her goal today is to run some errands with her children and then help one with PT.          Second Therapeutic Activity: Counselor  covered topic of distress tolerance skills today.  Counselor utilized a DBT handout which explained how distressing situations don't always have quick solutions, so the only choice is to sit with uncomfortable emotions until they pass.  Counselor offered the IMPROVE acronym as a solution to this problem, which outlined various skills (i.e. Imagery, Meaning, Prayer, Relaxation, 'One thing in the moment', Vacation, and Encouragement) that could be explored in order to improve ability to tolerate discomfort.  Counselor tasked members with identifying personalized strategies for each category which could have been implemented to handle a recent challenge more effectively.  Intervention was effective, as evidenced by Cynthia Cohen actively engaging in discussion on subject, reporting that she was able to identify several strategies for handling a future struggle, including visualizing a relaxing home in a forest that she can mentally escape to, completing a picture in a coloring book, blowing bubbles, playing with Play doh, or rubbing a worry stone.  Assessment and Plan: Counselor recommends that Cynthia Cohen remain in IOP treatment to better manage mental health symptoms, ensure stability and pursue completion of treatment plan goals. Counselor recommends adherence to crisis/safety plan, taking medications as prescribed, and following up with medical professionals if any issues arise.    Follow Up Instructions: Counselor will send [employer] Teams link for session tomorrow.  Cynthia Cohen was advised to call back or seek an in-person evaluation if the symptoms worsen or if the condition fails to improve as anticipated.   Collaboration of Care:   Medication Management AEB Dan Dun, NP or Dr. Marilou Showman  Case Manager AEB Molinda Angelica, CNA    Patient/Guardian was advised Release of Information must be obtained prior to any record release in order to collaborate their care with an outside  provider. Patient/Guardian was advised if they have not already done so to contact the registration department to sign all necessary forms in order for us  to release information regarding their care.    Consent: Patient/Guardian gives verbal consent for treatment and assignment of benefits for services provided during this visit. Patient/Guardian expressed understanding and agreed to proceed.   I provided 180 minutes of non-face-to-face time during this encounter.   Cynthia [place], LCSW, LCAS 07/18/23

## 2023-07-18 NOTE — Progress Notes (Signed)
 Virtual Visit via Video Note  I connected with Cynthia Cohen on 07/18/23 at  9:00 AM EDT by a video enabled telemedicine application and verified that I am speaking with the correct person using two identifiers.  Location: Patient: Home Provider: Office   I discussed the limitations of evaluation and management by telemedicine and the availability of in person appointments. The patient expressed understanding and agreed to proceed.     I discussed the assessment and treatment plan with the patient. The patient was provided an opportunity to ask questions and all were answered. The patient agreed with the plan and demonstrated an understanding of the instructions.   The patient was advised to call back or seek an in-person evaluation if the symptoms worsen or if the condition fails to improve as anticipated.  I provided 15 minutes of non-face-to-face time during this encounter.   Cynthia Reagin, NP    Psychiatric Initial Adult Assessment   Patient Identification: Cynthia Cohen MRN:  [id number] Date of Evaluation:  07/18/2023 Referral Source: [facility] Chief Complaint:  No chief complaint on file.  Visit Diagnosis:    ICD-10-CM   1. GAD (generalized anxiety disorder)  F41.1     2. MDD (major depressive disorder), recurrent episode, moderate (HCC)  F33.1       History of Present Illness:  Cynthia Cohen 58 year old Caucasian female presents after recent inpatient hospitalization. "  This was my second hospitalization, had a difficult time finding additional outpatient resources after my first hospitalization.  She reports she was hospitalized in order to titrate her medications quickly.  Cynthia Cohen reports that diagnosis related to major depressive disorder, generalized anxiety disorder, posttraumatic stress disorder, attention deficit disorder, insomnia and Tourette's  Cynthia Cohen stated that she was had been taking Pristiq which she feels has "worn off."  States she was tapered off of  Pristiq and initiated on Viibryd which she reports she has been taking and tolerating well.  States she continues to take Concerta however is seeking to be started on Provigil.  States she takes Topamax 200 mg twice a day, Lamictal  200 mg Minipress 3 mg and Klonopin  2 mg twice daily.    She denied illicit drug use or substance abuse history.  Reports a history related to Tourette's and sleep disturbance.  Carries a diagnosis related to generalized anxiety disorder and major depressive disorder.  Reports previous suicide attempt 30+ years ago.  States she does not recall the details surrounding that attempt only to state that she took insulin however was involved with a really bad therapist at that time.  States she attended a partial hospitalization program in 69s.  Cynthia Cohen denied any thoughts to harm herself throughout this assessment.  Appears future goal oriented as she states she would never harm herself due to her son states they are seeking to adopt 2 additional children this year.  Reports she is currently followed by Cynthia Cohen for medication management and also's is followed by therapy services.  States plans to discharge and follow-up with 3 times a week follow-up visit.  Patient to start partial hospitalization programming 07/18/2023  Cynthia Cohen is sitting noted to have multiple head tick as she attributes to diagnoses related to Tourette's; she is alert/oriented x 4; calm/cooperative; and mood congruent with affect.  Patient is speaking in a clear tone at moderate volume, and normal pace; with good eye contact.  Her thought process is coherent and relevant; There is no indication that she is currently responding to internal/external stimuli or experiencing  delusional thought content.    Patient denies suicidal/self-harm/homicidal ideation, psychosis, and paranoia.  Patient has remained calm throughout assessment and has answered questions appropriately.   Associated  Signs/Symptoms: Depression Symptoms:  depressed mood, difficulty concentrating, anxiety, (Hypo) Manic Symptoms:  Distractibility, Anxiety Symptoms:  Excessive Worry, Psychotic Symptoms:  Hallucinations: None PTSD Symptoms: Had a traumatic exposure:  Reports history of disassociation  Past Psychiatric History: See chart  Previous Psychotropic Medications: Yes   Substance Abuse History in the last 12 months:  Yes.    Consequences of Substance Abuse: NA  Past Medical History:  Past Medical History:  Diagnosis Date   Anxiety    Arthritis    L knee    Asthma    rare exposure to smoke or other irritatnt    Back pain    Clostridium difficile diarrhea    Depression    Diabetes mellitus without complication (HCC)    in combination with hyperinsulinnemia    GERD (gastroesophageal reflux disease)    Hyperinsulinemia    Hypertension    Hypothyroidism    Neuromuscular disorder (HCC)    tourette syndrome, followed by neurology,neuropathy    Recurrent upper respiratory infection (URI)    Thyroid disease     Past Surgical History:  Procedure Laterality Date   ABDOMINAL SURGERY     ADENOIDECTOMY     APPENDECTOMY     BLADDER SURGERY     bladder tumor removed- 1997,bladder pacemaker - not active at the present time    BOWEL RESECTION     CARDIAC CATHETERIZATION     done for stress related event, in [place] , [place]- told that every thing was ok, punctured femoral artery- treated /w Plasma    CERVICAL ABLATION     CHOLECYSTECTOMY     EXCISIONAL TOTAL KNEE ARTHROPLASTY Left 12/09/2013   Procedure: POLYEXCHANGE WITH SYNOVECTOMY;  Surgeon: Christie Cox, MD;  Location: [facility] OR;  Service: Orthopedics;  Laterality: Left;   GASTRIC BYPASS     HERNIA REPAIR     ILEOSTOMY REVISION     REVISION TOTAL KNEE ARTHROPLASTY Left 12/09/2013   dr Genevive Ket   TONSILLECTOMY     TOTAL KNEE ARTHROPLASTY Left 2014   at [facility] Psychiatric History:   Family History:  Family History  Problem  Relation Age of Onset   Diabetes Mother    Hypertension Mother    Allergic rhinitis Mother    Aneurysm Mother    Anxiety disorder Father    Alcohol abuse Father    Parkinson's disease Father     Social History:   Social History   Socioeconomic History   Marital status: Married    Spouse name: Not on file   Number of children: Not on file   Years of education: Not on file   Highest education level: Not on file  Occupational History   Not on file  Tobacco Use   Smoking status: Passive Smoke Exposure - Never Smoker   Smokeless tobacco: Never   Tobacco comments:    in clients' home.   Vaping Use   Vaping status: Never Used  Substance and Sexual Activity   Alcohol use: No   Drug use: No   Sexual activity: Not on file  Other Topics Concern   Not on file  Social History Narrative   Not on file   Social Drivers of Cohen   Financial Resource Strain: Not on file  Food Insecurity: No Food Insecurity (06/24/2023)   Received from [facility]   Hunger Vital Sign    Worried About Running Out of Food in the Last Year: Never true    Ran Out of Food in the Last Year: Never true  Transportation Needs: No Transportation Needs (07/03/2023)   Received from [facility]   PRAPARE - Transportation    Lack of Transportation (Medical): No    Lack of Transportation (Non-Medical): No  Physical Activity: Not on file  Stress: Stress Concern Present (06/24/2023)   Received from [facility] - Occupational Stress Questionnaire    Feeling of Stress : Rather much  Social Connections: Unknown (06/18/2021)   Received from [facility]   Social Network    Social Network: Not on file    Additional Social History:   Allergies:   Allergies  Allergen Reactions   Nitrofurantoin  Rash   Metoclopramide Other (See Comments)    jitters   Nitrofurantoin  Macrocrystal Rash   Tape Dermatitis and Rash    Localized adhesive reaction. Paper tape is okay.     Metabolic Disorder Labs: No results found for: "HGBA1C", "MPG" No results found for: "PROLACTIN" No results found for: "CHOL", "TRIG", "HDL", "CHOLHDL", "VLDL", "LDLCALC" No results found for: "TSH"  Therapeutic Level Labs: No results found for: "LITHIUM" No results found for: "CBMZ" No results found for: "VALPROATE"  Current Medications: Current Outpatient Medications  Medication Sig Dispense Refill   albuterol  (VENTOLIN  HFA) 108 (90 Base) MCG/ACT inhaler Inhale 1-2 puffs into the lungs every 6 (six) hours as needed for wheezing or shortness of breath. 18 g 0   clonazePAM  (KLONOPIN ) 1 MG tablet Take 1 mg by mouth daily as needed for anxiety.      desvenlafaxine (PRISTIQ) 50 MG 24 hr tablet Take 50 mg by mouth daily.     gabapentin  (NEURONTIN ) 400 MG capsule Take 400 mg by mouth 2 (two) times a day.     lamoTRIgine  (LAMICTAL ) 200 MG tablet Take 200 mg by mouth at bedtime.      pantoprazole  (PROTONIX ) 20 MG tablet Take 20 mg by mouth daily.     No current facility-administered medications for this visit.    Musculoskeletal: Virtual assessment  Psychiatric Specialty Exam: Review of Systems  There were no vitals taken for this visit.There is no height or weight on file to calculate BMI.  General Appearance: Casual  Eye Contact:  Good  Speech:  Clear and Coherent  Volume:  Normal  Mood:  Anxious and Depressed  Affect:  Congruent  Thought Process:  Coherent  Orientation:  Full (Time, Place, and Person)  Thought Content:  Logical  Suicidal Thoughts:  No  Homicidal Thoughts:  No  Memory:  Immediate;   Good Recent;   Good  Judgement:  Good  Insight:  Good  Psychomotor Activity:  Normal  Concentration:  Concentration: Good  Recall:  Good  Fund of Knowledge:Good  Language: Good  Akathisia:  No  Handed:  Right  AIMS (if indicated):  done  Assets:  Communication Skills Desire for Improvement Social Support  ADL's:  Intact  Cognition: WNL  Sleep:  Fair    Screenings: GAD-7    Advertising copywriter from 07/04/2023 in [facility]  Total GAD-7 Score 15      PHQ2-9    Flowsheet Row Counselor from 07/04/2023 in [facility]  PHQ-2 Total Score 3  PHQ-9 Total Score 12      Flowsheet Row Counselor from 07/04/2023 in [facility]/09/20[facility] in [facility] UC from 12/29/2021 in [facility])  C-SSRS RISK CATEGORY Moderate Risk No Risk No Risk       Assessment and Plan:  Start Intensive outpatient programming (IOP) - Reports she currently is prescribed Klonopin .  States she was discontinued from Pristiq initiated on Viibryd continues to take Lamictal  Concerta Minipress and Topamax  Cynthia Cohen is enrolled in intensive outpatient hospitalization Program, patient's current medications are to be continued, the following medications are being continued a comprehensive treatment plan will be developed and side effects of medications have been reviewed with patient  Treatment options and alternatives reviewed with patient and patient understands the above plan. Treatment plan was reviewed and agreed upon by NP Cynthia Cohen and patient Cynthia Cohen need for group services.  Collaboration of Care: Psychiatrist AEB Cynthia Cohen-psychiatrist and therapists through Cynthia Cohen  Patient/Guardian was advised Release of Information must be obtained prior to any record release in order to collaborate their care with an outside provider. Patient/Guardian was advised if they have not already done so to contact the registration department to sign all necessary forms in order for us  to release information regarding their care.   Consent: Patient/Guardian gives verbal consent for treatment and assignment of benefits for services provided during this  visit. Patient/Guardian expressed understanding and agreed to proceed.   Cynthia Reagin, NP 6/10/202510:58 AM

## 2023-07-19 ENCOUNTER — Other Ambulatory Visit (HOSPITAL_COMMUNITY): Attending: Psychiatry | Admitting: Licensed Clinical Social Worker

## 2023-07-19 DIAGNOSIS — F4310 Post-traumatic stress disorder, unspecified: Secondary | ICD-10-CM | POA: Insufficient documentation

## 2023-07-19 DIAGNOSIS — G4700 Insomnia, unspecified: Secondary | ICD-10-CM | POA: Insufficient documentation

## 2023-07-19 DIAGNOSIS — F329 Major depressive disorder, single episode, unspecified: Secondary | ICD-10-CM | POA: Diagnosis present

## 2023-07-19 DIAGNOSIS — Z79899 Other long term (current) drug therapy: Secondary | ICD-10-CM | POA: Diagnosis not present

## 2023-07-19 DIAGNOSIS — F332 Major depressive disorder, recurrent severe without psychotic features: Secondary | ICD-10-CM

## 2023-07-19 DIAGNOSIS — F411 Generalized anxiety disorder: Secondary | ICD-10-CM | POA: Insufficient documentation

## 2023-07-19 DIAGNOSIS — F952 Tourette's disorder: Secondary | ICD-10-CM | POA: Insufficient documentation

## 2023-07-19 NOTE — Progress Notes (Signed)
 Virtual Visit via Video Note   I connected with Lashunta Frieden on 07/19/23 at  9:00 AM EDT by a video enabled telemedicine application and verified that I am speaking with the correct person using two identifiers.   At orientation to the IOP program, Case Manager discussed the limitations of evaluation and management by telemedicine and the availability of in person appointments. The patient expressed understanding and agreed to proceed with virtual visits throughout the duration of the program.   Location:  Patient: Patient Home Provider: Home Office   History of Present Illness: MDD, GAD, and PTSD   Observations/Objective: Check In: Case Manager checked in with all participants to review discharge dates, insurance authorizations, work-related documents and needs from the treatment team regarding medications. Shoshana stated needs and engaged in discussion.    Initial Therapeutic Activity: Counselor facilitated a check-in with Mahati to assess for safety, sobriety and medication compliance.  Counselor also inquired about Jelicia's current emotional ratings, as well as any significant changes in thoughts, feelings or behavior since previous check in.  Aly presented for session on time and was alert, oriented x5, with no evidence or self-report of active SI/HI or A/V H.  Vannia reported compliance with medication and denied use of alcohol or illicit substances.  Lakevia reported scores of 5/10 for depression, 4/10 for anxiety, and 5/10 for anger/irritability.  Shaquandra denied any recent outbursts or panic attacks.  Marsena reported that a success was taking her youngest child to physical therapy yesterday, and helping another one prepare for a last day of school.  Johnita reported that a struggle was feeling very tired after completing these tasks and dealing with 'temper tantrums' from her daughter yesterday.  Calisha reported that her goal today is to take time to rest to get her energy back.          Second  Therapeutic Activity: Counselor introduced topic of self-care today.  Counselor explained how this can be defined as the things one does to maintain good health and improve well-being.  Counselor provided members with a self-care assessment form to complete.  This handout featured various sub-categories of self-care, including physical, psychological/emotional, social, spiritual, and professional.  Members were asked to rank their engagement in the activities listed for each dimension on a scale of 1-3, with 1 indicating 'Poor', 2 indicating 'Ok', and 3 indicating 'Well'.  Counselor invited members to share results of their assessment, and inquired about which areas of self-care they are doing well in, as well as areas that require attention, and how they plan to begin addressing this during treatment.  Intervention effectiveness could not be measured, as counselor had to end group early at 11am due to family emergency.    Assessment and Plan: Counselor recommends that Disa remain in IOP treatment to better manage mental health symptoms, ensure stability and pursue completion of treatment plan goals. Counselor recommends adherence to crisis/safety plan, taking medications as prescribed, and following up with medical professionals if any issues arise.    Follow Up Instructions: Counselor will send Microsoft Teams link for session tomorrow.  Dierdre was advised to call back or seek an in-person evaluation if the symptoms worsen or if the condition fails to improve as anticipated.   Collaboration of Care:   Medication Management AEB Dan Dun, NP or Dr. Marilou Showman  Case Manager AEB Molinda Angelica, CNA    Patient/Guardian was advised Release of Information must be obtained prior to any record release in order to collaborate their care with an outside provider. Patient/Guardian was advised if they have not already done so to contact the registration department to sign  all necessary forms in order for us  to release information regarding their care.    Consent: Patient/Guardian gives verbal consent for treatment and assignment of benefits for services provided during this visit. Patient/Guardian expressed understanding and agreed to proceed.   I provided 120 minutes of non-face-to-face time during this encounter.   Desmond Florida, LCSW, LCAS 07/19/23

## 2023-07-20 ENCOUNTER — Other Ambulatory Visit (HOSPITAL_COMMUNITY)

## 2023-07-21 ENCOUNTER — Other Ambulatory Visit (HOSPITAL_COMMUNITY): Admitting: Licensed Clinical Social Worker

## 2023-07-21 DIAGNOSIS — F332 Major depressive disorder, recurrent severe without psychotic features: Secondary | ICD-10-CM

## 2023-07-21 DIAGNOSIS — F331 Major depressive disorder, recurrent, moderate: Secondary | ICD-10-CM | POA: Diagnosis not present

## 2023-07-21 DIAGNOSIS — F411 Generalized anxiety disorder: Secondary | ICD-10-CM

## 2023-07-24 ENCOUNTER — Other Ambulatory Visit (HOSPITAL_COMMUNITY): Attending: Psychiatry | Admitting: Licensed Clinical Social Worker

## 2023-07-24 DIAGNOSIS — F329 Major depressive disorder, single episode, unspecified: Secondary | ICD-10-CM | POA: Diagnosis present

## 2023-07-24 DIAGNOSIS — F411 Generalized anxiety disorder: Secondary | ICD-10-CM | POA: Insufficient documentation

## 2023-07-24 DIAGNOSIS — F4310 Post-traumatic stress disorder, unspecified: Secondary | ICD-10-CM | POA: Diagnosis not present

## 2023-07-24 DIAGNOSIS — F332 Major depressive disorder, recurrent severe without psychotic features: Secondary | ICD-10-CM

## 2023-07-24 NOTE — Progress Notes (Signed)
 Virtual Visit via Video Note   I connected with Cynthia Cohen on 07/24/23 at  9:00 AM EDT by a video enabled telemedicine application and verified that I am speaking with the correct person using two identifiers.   At orientation to the IOP program, Case Manager discussed the limitations of evaluation and management by telemedicine and the availability of in person appointments. The patient expressed understanding and agreed to proceed with virtual visits throughout the duration of the program.   Location:  Patient: Patient Home Provider: [facility] Office   History of Present Illness: MDD, GAD, PTSD   Observations/Objective: Check In: Case Manager checked in with all participants to review discharge dates, insurance authorizations, work-related documents and needs from the treatment team regarding medications. Cynthia Cohen stated needs and engaged in discussion.    Initial Therapeutic Activity: Counselor facilitated a check-in with Cynthia Cohen to assess for safety, sobriety and medication compliance.  Counselor also inquired about Avilyn's current emotional ratings, as well as any significant changes in thoughts, feelings or behavior since previous check in.  Cynthia Cohen presented for session on time and was alert, oriented x5, with no evidence or self-report of active SI/HI or A/V H.  Cynthia Cohen reported compliance with medication and denied use of alcohol or illicit substances.  Cynthia Cohen reported scores of 8/10 for depression, 8/10 for anxiety, and 9/10 for anger/irritability.  Cynthia Cohen denied any recent outbursts.  Cynthia Cohen reported that a struggle has been experiencing increased panic attacks and stress due to a number of factors, including one daughter acting out.  Cynthia Cohen reported that a success has been finding resources to help with her child's behavioral issues.  Cynthia Cohen reported that her goal today is to find something to do for self-care to ensure at outlet for this stress.          Second Therapeutic Activity: Counselor  introduced Cynthia Cohen, [facility] to provide psychoeducation on topic of Grief and Loss with members today.  Cynthia Cohen began discussion by checking in with the group about their baseline mood today, general thoughts on what grief means to them and how it has affected them personally in the past.  Cynthia Cohen provided information on how the process of grief/loss can differ depending upon one's unique culture, and categories of loss one could experience (i.e. loss of a person, animal, relationship, job, identity, etc).  Cynthia Cohen encouraged members to be mindful of how pervasive loss can be, and how to recognize signs which could indicate that this is having an impact on one's overall mental health and wellbeing.  Intervention was effective, as evidenced by Cynthia Cohen participating in discussion with speaker on the subject, reporting that this made her reflect upon the loss of a career dream, as she went to college when she was young, and wanted to eventually become a physician, but issues with her family led to 'sabotage', and she grieved this for years.  Cynthia Cohen reported that something which brings her joy and helps her cope with loss is thinking about her children, since they bring her comfort.  Cynthia Cohen stated "This was very helpful".    Third Therapeutic Activity: Counselor also acknowledged 3 graduating group members by prompting them to reflect on progress made since beginning the MHIOP program, notable takeaways from sessions attended, challenges overcome, and plan for continued care following discharge. Counselor and group members shared observations of growth, words of encouragement and support as these members transitioned out of the program today.  Intervention was effective, as evidenced by Cynthia Cohen participating in discussion with  all 3 members, reporting that hearing their personal experiences was helpful for making her feel less alone and more open to discussing her own challenges.  Cynthia Cohen stated "This has given  me hope and it was nice getting to know all of you".    Assessment and Plan: Counselor recommends that Cynthia Cohen remain in IOP treatment to better manage mental health symptoms, ensure stability and pursue completion of treatment plan goals. Counselor recommends adherence to crisis/safety plan, taking medications as prescribed, and following up with medical professionals if any issues arise.    Follow Up Instructions: Counselor will send Microsoft Teams link for session tomorrow.  Cynthia Cohen was advised to call back or seek an in-person evaluation if the symptoms worsen or if the condition fails to improve as anticipated.   Collaboration of Care:   Medication Management AEB Dan Dun, NP or Dr. Marilou Showman                                          Case Manager AEB Molinda Angelica, CNA    Patient/Guardian was advised Release of Information must be obtained prior to any record release in order to collaborate their care with an outside provider. Patient/Guardian was advised if they have not already done so to contact the registration department to sign all necessary forms in order for us  to release information regarding their care.    Consent: Patient/Guardian gives verbal consent for treatment and assignment of benefits for services provided during this visit. Patient/Guardian expressed understanding and agreed to proceed.   I provided 180 minutes of non-face-to-face time during this encounter.   Desmond Florida, LCSW, LCAS 07/24/23

## 2023-07-25 ENCOUNTER — Other Ambulatory Visit (HOSPITAL_COMMUNITY): Admitting: Psychiatry

## 2023-07-25 DIAGNOSIS — F411 Generalized anxiety disorder: Secondary | ICD-10-CM

## 2023-07-25 DIAGNOSIS — F329 Major depressive disorder, single episode, unspecified: Secondary | ICD-10-CM | POA: Diagnosis not present

## 2023-07-25 DIAGNOSIS — F4310 Post-traumatic stress disorder, unspecified: Secondary | ICD-10-CM

## 2023-07-25 DIAGNOSIS — F332 Major depressive disorder, recurrent severe without psychotic features: Secondary | ICD-10-CM

## 2023-07-25 NOTE — Progress Notes (Signed)
 Virtual Visit via Video Note   I connected with Cynthia Cohen on 07/25/23 at  9:00 AM EDT by a video enabled telemedicine application and verified that I am speaking with the correct person using two identifiers.   At orientation to the IOP program, Case Manager discussed the limitations of evaluation and management by telemedicine and the availability of in person appointments. The patient expressed understanding and agreed to proceed with virtual visits throughout the duration of the program.   Location:  Patient: Patient Home Provider: [facility]   History of Present Illness: MDD, GAD, and PTSD   Observations/Objective: Check In: Case Manager checked in with all participants to review discharge dates, insurance authorizations, work-related documents and needs from the treatment team regarding medications. Cynthia Cohen stated needs and engaged in discussion.    Initial Therapeutic Activity: Counselor facilitated a check-in with Cynthia Cohen to assess for safety, sobriety and medication compliance.  Counselor also inquired about Cynthia Cohen's current emotional ratings, as well as any significant changes in thoughts, feelings or behavior since previous check in.  Cynthia Cohen presented for session on time and was alert, oriented x5, with no evidence or self-report of active SI/HI or A/V H.  Cynthia Cohen reported compliance with medication and denied use of alcohol or illicit substances.  Cynthia Cohen reported scores of 7/10 for depression, 8/10 for anxiety, and 8/10 for anger/irritability.  Cynthia Cohen denied any recent outbursts or panic attacks.  Cynthia Cohen reported that a struggle was overwhelming herself yesterday with errands, stating "I exhausted myself".  Cynthia Cohen reported that a success was getting up today on time for group despite her fatigue.  Cynthia Cohen reported that her goal today is to go out with a friend to run more errands for the children.          Second Therapeutic Activity: Counselor introduced topic of anger management today.   Counselor virtually shared a handout with members on this subject featuring a variety of coping skills, and facilitated discussion on these approaches.  Examples included raising awareness of anger triggers, practicing deep breathing, keeping an anger log to better understand episodes, using diversion activities to distract oneself for 30 minutes, taking a time out when necessary, and being mindful of warning signs tied to thoughts or behavior.  Counselor inquired about which techniques group members have used before, what has proved to be helpful, what their unique warning signs might be, as well as what they will try out in the future to assist with de-escalation.  Intervention was effective, as evidenced by Cynthia Cohen participating in discussion on activity, and reporting that she has developed a habit of compartmentalizing anger in order to avoid outbursts, but this doesn't always work.  Cynthia Cohen stated "I rarely cry.  I shut those feelings down, but I think my anger can cause my panic attacks".  Cynthia Cohen reported that her triggers include feeling lonely, overwhelmed, or ignored.  Cynthia Cohen reported that warning signs include headaches, digestive issues, and muscle tension.  Cynthia Cohen reported that she will work to manage anger more effectively by using coping skills such as exercise, avoid compulsive eating, and keep an anger journal to track outbursts and effectiveness of new coping strategies.  Cynthia Cohen also acknowledged that if she manages anger more effectively, it could benefit others too, reporting that she has used anger as an opportunity to advocate for her children when they aren't being treated properly.  Assessment and Plan: Counselor recommends that [name] remain in IOP treatment to better manage mental health symptoms, ensure stability and pursue completion of treatment plan goals. Counselor recommends adherence to crisis/safety plan, taking medications as prescribed, and following up with  medical professionals if any issues arise.    Follow Up Instructions: Counselor will send [employer] Teams link for session tomorrow.  Cynthia Cohen was advised to call back or seek an in-person evaluation if the symptoms worsen or if the condition fails to improve as anticipated.   Collaboration of Care:   Medication Management AEB Dan Dun, NP or Dr. Marilou Showman                                          Case Manager AEB Molinda Angelica, CNA    Patient/Guardian was advised Release of Information must be obtained prior to any record release in order to collaborate their care with an outside provider. Patient/Guardian was advised if they have not already done so to contact the registration department to sign all necessary forms in order for us  to release information regarding their care.    Consent: Patient/Guardian gives verbal consent for treatment and assignment of benefits for services provided during this visit. Patient/Guardian expressed understanding and agreed to proceed.   I provided 180 minutes of non-face-to-face time during this encounter.   Desmond Florida, LCSW, LCAS 07/25/23

## 2023-07-26 ENCOUNTER — Other Ambulatory Visit (HOSPITAL_COMMUNITY): Admitting: Licensed Clinical Social Worker

## 2023-07-26 DIAGNOSIS — F4310 Post-traumatic stress disorder, unspecified: Secondary | ICD-10-CM

## 2023-07-26 DIAGNOSIS — F332 Major depressive disorder, recurrent severe without psychotic features: Secondary | ICD-10-CM

## 2023-07-26 DIAGNOSIS — F411 Generalized anxiety disorder: Secondary | ICD-10-CM

## 2023-07-26 DIAGNOSIS — F329 Major depressive disorder, single episode, unspecified: Secondary | ICD-10-CM | POA: Diagnosis not present

## 2023-07-26 NOTE — Progress Notes (Signed)
 Virtual Visit via Video Note   I connected with Cynthia Cohen on 07/26/23 at  9:00 AM EDT by a video enabled telemedicine application and verified that I am speaking with the correct person using two identifiers.   At orientation to the IOP program, Case Manager discussed the limitations of evaluation and management by telemedicine and the availability of in person appointments. The patient expressed understanding and agreed to proceed with virtual visits throughout the duration of the program.   Location:  Patient: Patient Home Provider: [facility]   History of Present Illness: MDD, GAD, and PTSD   Observations/Objective: Check In: Case Manager checked in with all participants to review discharge dates, insurance authorizations, work-related documents and needs from the treatment team regarding medications. Cynthia Cohen stated needs and engaged in discussion.    Initial Therapeutic Activity: Counselor facilitated a check-in with Cynthia Cohen to assess for safety, sobriety and medication compliance.  Counselor also inquired about Cynthia Cohen's current emotional ratings, as well as any significant changes in thoughts, feelings or behavior since previous check in.  Cynthia Cohen presented for session on time and was alert, oriented x5, with no evidence or self-report of active SI/HI or A/V H.  Cynthia Cohen reported compliance with medication and denied use of alcohol or illicit substances.  Cynthia Cohen reported scores of 6/10 for depression, 8/10 for anxiety, and 8/10 for anger.  Cynthia Cohen denied any recent outbursts.  Cynthia Cohen reported that a struggle has been experiencing panic attack each day, as well as an outburst yesterday, stating "I had some neighbors playing loud music at 10 last night and finally said something".  Cynthia Cohen reported that a success was running some errands for the children yesterday to stay busy.  Cynthia Cohen reported that her goal today is to talk to her psychiatrist about issues she has had with her Prozosin.             Second Therapeutic Activity: Counselor introduced Cynthia Cohen, Cynthia Pharmacist, to provide psychoeducation on topic of medication compliance with members today.  Cynthia Cohen provided psychoeducation on classes of medications such as antidepressants, antipsychotics, what symptoms they are intended to treat, and any side effects one might encounter while on a particular prescription.  Time was allowed for clients to ask any questions they might have of Cynthia Cohen regarding this specialty.  Intervention was effective, as evidenced by Cynthia Cohen participating in discussion with speaker on the subject, reporting that she has had trouble with a medication causing weight gain and wondered about alternatives.  Cynthia Cohen was receptive to feedback from pharmacist on costs and benefits of this medication, and potential options to explore with provider.     Third Therapeutic Activity: Counselor introduced Cynthia Cohen, Cynthia Cohen, to provide psychoeducation on topic of nutrition with members today.  Cynthia Cohen virtually shared a comprehensive PowerPoint presentation to guide discussion, which featured the various components of healthy living that influence one's wellbeing, including practicing mindfulness, staying physically active, calm in mood, well-rested, and more.  Cynthia Cohen explained how good nutrition reinforces positive physical and mental health, and shared a video which explained how food intake affects brain functioning in particular.  Cynthia Cohen provided advice on how to adjust diet in order to promote wellbeing during course of treatment, including achieving balanced daily intake along with regular exercise.  Cynthia Cohen offered the 'plate method' as a tool for proper distribution of protein, grains, starches, vegetables, fruit, and low calorie drink, in addition to concept of 'mindful eating', and covering current Dietary Guidelines for Americans for more tips.  Cynthia Cohen inquired  about changes members would like to make to their nutrition in order to  increase overall wellbeing based upon information shared today, and time was allowed to ask any questions they might have of Cynthia Cohen regarding her specialty.  Intervention was effective, as evidenced by Cynthia Cohen participating in discussion with speaker on the subject, reporting that she is a vegetarian, but would be interested in increasing protein intake to further improve diet. Cynthia Cohen expressed receptiveness to a low-calorie drinks that were mentioned as well as a substitute for soda.  Cynthia Cohen also participated in a stretching activity guided by speaker to improve physical activity.    Assessment and Plan: Counselor recommends that Cynthia Cohen remain in IOP treatment to better manage mental health symptoms, ensure stability and pursue completion of treatment plan goals. Counselor recommends adherence to crisis/safety plan, taking medications as prescribed, and following up with medical professionals if any issues arise.    Follow Up Instructions: Counselor will send [employer] Teams link for session tomorrow.  Cynthia Cohen was advised to call back or seek an in-person evaluation if the symptoms worsen or if the condition fails to improve as anticipated.   Collaboration of Care:   Medication Management AEB Dan Dun, NP or Dr. Marilou Showman                                          Case Manager AEB Molinda Angelica, CNA    Patient/Guardian was advised Release of Information must be obtained prior to any record release in order to collaborate their care with an outside provider. Patient/Guardian was advised if they have not already done so to contact the registration department to sign all necessary forms in order for us  to release information regarding their care.    Consent: Patient/Guardian gives verbal consent for treatment and assignment of benefits for services provided during this visit. Patient/Guardian expressed understanding and agreed to proceed.   I provided 180 minutes of non-face-to-face time during this  encounter.   Desmond Florida, LCSW, LCAS 07/26/23

## 2023-07-27 ENCOUNTER — Telehealth (HOSPITAL_COMMUNITY): Payer: Self-pay | Admitting: Psychiatry

## 2023-07-27 ENCOUNTER — Encounter (HOSPITAL_COMMUNITY): Payer: Self-pay

## 2023-07-27 ENCOUNTER — Other Ambulatory Visit (HOSPITAL_COMMUNITY): Admitting: Psychiatry

## 2023-07-27 NOTE — Telephone Encounter (Signed)
 D:  Pt excused today d/t being up all night with a sick child.  States she will be with him today.  A:  Inform treatment team.

## 2023-07-28 ENCOUNTER — Other Ambulatory Visit (HOSPITAL_COMMUNITY): Admitting: Psychiatry

## 2023-07-28 DIAGNOSIS — F332 Major depressive disorder, recurrent severe without psychotic features: Secondary | ICD-10-CM

## 2023-07-28 DIAGNOSIS — F411 Generalized anxiety disorder: Secondary | ICD-10-CM

## 2023-07-28 DIAGNOSIS — F4310 Post-traumatic stress disorder, unspecified: Secondary | ICD-10-CM

## 2023-07-28 DIAGNOSIS — F329 Major depressive disorder, single episode, unspecified: Secondary | ICD-10-CM | POA: Diagnosis not present

## 2023-07-28 NOTE — Progress Notes (Signed)
 Virtual Visit via Video Note   I connected with Cynthia Cohen on 07/28/23 at  9:00 AM EDT by a video enabled telemedicine application and verified that I am speaking with the correct person using two identifiers.   At orientation to the IOP program, Case Manager discussed the limitations of evaluation and management by telemedicine and the availability of in person appointments. The patient expressed understanding and agreed to proceed with virtual visits throughout the duration of the program.   Location:  Patient: Patient Home Provider: [facility]   History of Present Illness: MDD, GAD, and PTSD   Observations/Objective: Check In: Case Manager checked in with all participants to review discharge dates, insurance authorizations, work-related documents and needs from the treatment team regarding medications. Cynthia Cohen stated needs and engaged in discussion.    Initial Therapeutic Activity: Counselor facilitated a check-in with Cynthia Cohen to assess for safety, sobriety and medication compliance.  Counselor also inquired about Cynthia Cohen's current emotional ratings, as well as any significant changes in thoughts, feelings or behavior since previous check in.  Cynthia Cohen presented for session on time and was alert, oriented x5, with no evidence or self-report of active SI/HI or A/V H.  Cynthia Cohen reported compliance with medication and denied use of alcohol or illicit substances.  Cynthia Cohen reported scores of 7/10 for depression, 7/10 for anxiety, and 7/10 for irritability.  Cynthia Cohen denied any recent outbursts or panic attacks.  Cynthia Cohen reported that a struggle has been dealing with issues at home, and having an outburst at maintenance staff that visited this morning.  She stated "I was a bit terse".  Cynthia Cohen reported that her goal this weekend is to have a friend watch the children to give her a break.     Second Therapeutic Activity: Counselor introduced topic of grounding skills today.  Counselor defined these as simple  strategies one can use to help detach from difficult thoughts or feelings temporarily by focusing on something else.  Counselor noted that grounding will not solve the problem at hand, but can provide the practitioner with time to regain control over their thoughts and/or feelings and prevent the situation from getting worse (i.e. interrupting a panic attack).  Counselor divided these into three categories (mental, physical, and soothing) and then provided examples of each which group members could practice during session.  Some of these included describing one's environment in detail or playing a categories game with oneself for mental category, taking a hot bath/shower, stretching, or carrying a grounding object for physical category, and saying kind statements, or visualizing people one cares about for soothing category.  Counselor inquired about which techniques members have used with success in the past, or will commit to learning, practicing, and applying now to improve coping abilities.  Intervention was effective, as evidenced by Cynthia Cohen participating in discussion on the subject, trying out several of the techniques during session, and expressing interest in adding several to her available coping skills, such as describing her environment in detail, playing a categories game involving listing book authors, picturing herself relaxing in a forest environment, reading a 'cozy' book, watching animal videos on youtube that can make her laugh, rubbing a worry stone as a grounding object, or playing with a fidget toy.    Assessment and Plan: Counselor recommends that Cynthia Cohen remain in IOP treatment to better manage mental health symptoms, ensure stability and pursue completion of treatment plan goals. Counselor recommends adherence to crisis/safety plan, taking medications as prescribed, and following up with medical professionals if any  issues arise.    Follow Up Instructions: Counselor will send [employer]  Teams link for session tomorrow.  Cynthia Cohen was advised to call back or seek an in-person evaluation if the symptoms worsen or if the condition fails to improve as anticipated.   Collaboration of Care:   Medication Management AEB Dan Dun, NP or Dr. Marilou Showman                                          Case Manager AEB Molinda Angelica, CNA    Patient/Guardian was advised Release of Information must be obtained prior to any record release in order to collaborate their care with an outside provider. Patient/Guardian was advised if they have not already done so to contact the registration department to sign all necessary forms in order for us  to release information regarding their care.    Consent: Patient/Guardian gives verbal consent for treatment and assignment of benefits for services provided during this visit. Patient/Guardian expressed understanding and agreed to proceed.   I provided 180 minutes of non-face-to-face time during this encounter.   Desmond Florida, LCSW, LCAS 07/28/23

## 2023-07-31 ENCOUNTER — Other Ambulatory Visit (HOSPITAL_COMMUNITY): Admitting: Psychiatry

## 2023-07-31 ENCOUNTER — Telehealth (HOSPITAL_COMMUNITY): Payer: Self-pay | Admitting: Psychiatry

## 2023-07-31 NOTE — Telephone Encounter (Signed)
 D:  Pt sent an email that she wouldn't be attending MH-IOP today d/t illness.  A:  Informed treatment team.  R:  Pt receptive.

## 2023-08-01 ENCOUNTER — Other Ambulatory Visit (HOSPITAL_COMMUNITY)

## 2023-08-01 ENCOUNTER — Encounter (HOSPITAL_COMMUNITY): Payer: Self-pay | Admitting: Psychiatry

## 2023-08-01 ENCOUNTER — Telehealth (HOSPITAL_COMMUNITY): Payer: Self-pay | Admitting: Psychiatry

## 2023-08-01 NOTE — Progress Notes (Signed)
  [facility] Intensive Outpatient Program Discharge Summary  Cynthia Cohen [id number]  Admission date:07/18/2023  Discharge date: 08/01/2023  Reason for admission: Per admission assessment note:    Cynthia Cohen 58 year old Caucasian female presents after recent inpatient hospitalization.   This was my second hospitalization, had a difficult time finding additional outpatient resources after my first hospitalization.  She reports she was hospitalized in order to titrate her medications quickly.  Cynthia Cohen reports that diagnosis related to major depressive disorder, generalized anxiety disorder, posttraumatic stress disorder, attention deficit disorder, insomnia and Tourette's. Cynthia Cohen stated that she was had been taking Pristiq which she feels has worn off.  States she was tapered off of Pristiq and initiated on Viibryd which she reports she has been taking and tolerating well.  States she continues to take Concerta however is seeking to be started on Provigil.  States she takes Topamax 200 mg twice a day, Lamictal  200 mg Minipress 3 mg and Klonopin  2 mg twice daily.    Progress in Program Toward Treatment Goals: Progressing Cynthia Cohen attended and participated with daily group session with active and engaged participation for 1 week.  Patient was not seen at discharge as she had requested early discharge via email through case management.  Patient to keep all outpatient follow-up appointments with outpatient providers.  Progress (rationale): Take all of you medications as prescribed by your mental healthcare provider.  Report any adverse effects and reactions from your medications to your outpatient provider promptly.  Do not engage in alcohol and or illegal drug use while on prescription medicines. Keep all scheduled appointments. This is to ensure that you are getting refills on time and to avoid any interruption in your medication.  If you are unable to keep an appointment call to  reschedule.  Be sure to follow up with resources and follow ups given. In the event of worsening symptoms call the crisis hotline, 911, and or go to the nearest emergency department for appropriate evaluation and treatment of symptoms. Follow-up with your primary care provider for your medical issues, concerns and or health care needs.    Collaboration of Care: Medication Management AEB continue medications as indicated  Patient/Guardian was advised Release of Information must be obtained prior to any record release in order to collaborate their care with an outside provider. Patient/Guardian was advised if they have not already done so to contact the registration department to sign all necessary forms in order for us  to release information regarding their care.   Consent: Patient/Guardian gives verbal consent for treatment and assignment of benefits for services provided during this visit. Patient/Guardian expressed understanding and agreed to proceed.   Staci Kerns NP  08/01/2023

## 2023-08-01 NOTE — Telephone Encounter (Signed)
 D:  Pt sent the group facilitator Fenton Ricker, [place]) an email stating she has reached her maximum benefit for the program (MH-IOP) and wouldn't be returning.  A:  Placed call to pt, but there was no answer.  Will discharge pt today as per her request.  Inform Staci Kerns, NP.
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
## 2023-08-01 NOTE — Patient Instructions (Addendum)
 D:  Patient requested discharge from virtual MH-IOP.  A:  Discharge patient today (08-01-23).  Follow up with Dr. Dorothyann Seip and Mardy Shroeder (therapist).  Recommend support groups through [facility] [employer] [facility] [phone]).

## 2023-08-02 ENCOUNTER — Other Ambulatory Visit (HOSPITAL_COMMUNITY)

## 2023-08-03 ENCOUNTER — Other Ambulatory Visit (HOSPITAL_COMMUNITY)

## 2023-08-04 ENCOUNTER — Other Ambulatory Visit (HOSPITAL_COMMUNITY)

## 2023-08-09 NOTE — Progress Notes (Signed)
 Virtual Visit via Video Note  I connected with Cynthia Cohen on 07/21/23 at  9:00 AM EDT by a video enabled telemedicine application and verified that I am speaking with the correct person using two identifiers.  Location: Patient: patient home Provider: clinical home office   I discussed the limitations of evaluation and management by telemedicine and the availability of in person appointments. The patient expressed understanding and agreed to proceed.  I discussed the assessment and treatment plan with the patient. The patient was provided an opportunity to ask questions and all were answered. The patient agreed with the plan and demonstrated an understanding of the instructions.   The patient was advised to call back or seek an in-person evaluation if the symptoms worsen or if the condition fails to improve as anticipated.  I provided 180 minutes of non-face-to-face time during this encounter.   Randall Bastos, LCSW   Daily Group Progress Note  Program: IOP  Group Time: 9:00 - 10:00  Participation Level: Active  Behavioral Response: Appropriate and Sharing  Type of Therapy:  Group Therapy  Summary of Progress: Clinician led check-in regarding current stressors and situation, and review of patient completed daily inventory. Clinician utilized active listening and empathetic response and validated patient emotions. Clinician facilitated processing group on pertinent issues.   Therapist Response: Patient arrived within time allowed. Patient rates her mood at a 5 on a scale of 1-10 with 10 being best. Pt states she feels exhausted. Pt states she slept 4 hours and ate 2x. Pt reports increased stress with foster placement and the lack of support she is getting from the agency. Pt states she is living in fight/flight and is open to grounding strategies discussed. Pt able to process. Pt engaged in discussion.   Progress Towards Goals: Progressing    Group Time: 10:30 -  12:00  Participation Level:  Active  Behavioral Response: Appropriate and Sharing  Type of Therapy: Group Therapy  Summary of Progress: Cln led discussion on emotional reasoning and provided context in terms of CBT unhealthy thought patterns. Cln encouraged pt's to be more specific in their speech and thought dialogues to highlight the presence of a feeling, a mutable and time limited experience. Group members were encouraged to use reminder: feelings do not equal fact.   Therapist Response:  Pt engaged in discussion and is able to identify patterns in which they struggle to differentiate feelings and fact.   Progress Towards Goals: Progressing   Randall Bastos, LCSW

## 2023-08-30 ENCOUNTER — Other Ambulatory Visit: Payer: Self-pay

## 2023-08-30 ENCOUNTER — Emergency Department (HOSPITAL_COMMUNITY)

## 2023-08-30 ENCOUNTER — Observation Stay (HOSPITAL_COMMUNITY)
Admission: EM | Admit: 2023-08-30 | Discharge: 2023-09-02 | Disposition: A | Discharge status: Home / Self Care | Attending: Family Medicine | Admitting: Family Medicine

## 2023-08-30 DIAGNOSIS — F32A Depression, unspecified: Secondary | ICD-10-CM | POA: Diagnosis not present

## 2023-08-30 DIAGNOSIS — R29898 Other symptoms and signs involving the musculoskeletal system: Secondary | ICD-10-CM | POA: Diagnosis not present

## 2023-08-30 DIAGNOSIS — E559 Vitamin D deficiency, unspecified: Secondary | ICD-10-CM | POA: Diagnosis not present

## 2023-08-30 DIAGNOSIS — R55 Syncope and collapse: Principal | ICD-10-CM | POA: Insufficient documentation

## 2023-08-30 DIAGNOSIS — E119 Type 2 diabetes mellitus without complications: Secondary | ICD-10-CM | POA: Diagnosis not present

## 2023-08-30 DIAGNOSIS — F4310 Post-traumatic stress disorder, unspecified: Secondary | ICD-10-CM | POA: Insufficient documentation

## 2023-08-30 DIAGNOSIS — D72829 Elevated white blood cell count, unspecified: Secondary | ICD-10-CM | POA: Insufficient documentation

## 2023-08-30 DIAGNOSIS — G8929 Other chronic pain: Secondary | ICD-10-CM | POA: Insufficient documentation

## 2023-08-30 DIAGNOSIS — F840 Autistic disorder: Secondary | ICD-10-CM | POA: Insufficient documentation

## 2023-08-30 DIAGNOSIS — W19XXXA Unspecified fall, initial encounter: Principal | ICD-10-CM | POA: Diagnosis present

## 2023-08-30 DIAGNOSIS — K219 Gastro-esophageal reflux disease without esophagitis: Secondary | ICD-10-CM | POA: Insufficient documentation

## 2023-08-30 DIAGNOSIS — Z1152 Encounter for screening for COVID-19: Secondary | ICD-10-CM | POA: Insufficient documentation

## 2023-08-30 DIAGNOSIS — J45909 Unspecified asthma, uncomplicated: Secondary | ICD-10-CM | POA: Insufficient documentation

## 2023-08-30 DIAGNOSIS — R748 Abnormal levels of other serum enzymes: Secondary | ICD-10-CM

## 2023-08-30 DIAGNOSIS — R531 Weakness: Secondary | ICD-10-CM | POA: Diagnosis not present

## 2023-08-30 DIAGNOSIS — R471 Dysarthria and anarthria: Secondary | ICD-10-CM | POA: Diagnosis present

## 2023-08-30 DIAGNOSIS — Z043 Encounter for examination and observation following other accident: Secondary | ICD-10-CM | POA: Diagnosis present

## 2023-08-30 DIAGNOSIS — M549 Dorsalgia, unspecified: Secondary | ICD-10-CM | POA: Diagnosis not present

## 2023-08-30 DIAGNOSIS — F419 Anxiety disorder, unspecified: Secondary | ICD-10-CM | POA: Insufficient documentation

## 2023-08-30 LAB — COMPREHENSIVE METABOLIC PANEL WITH GFR
ALT: 10 U/L (ref 0–44)
AST: 25 U/L (ref 15–41)
Albumin: 3.2 g/dL — ABNORMAL LOW (ref 3.5–5.0)
Alkaline Phosphatase: 74 U/L (ref 38–126)
Anion gap: 6 (ref 5–15)
BUN: 13 mg/dL (ref 6–20)
CO2: 23 mmol/L (ref 22–32)
Calcium: 8.7 mg/dL — ABNORMAL LOW (ref 8.9–10.3)
Chloride: 115 mmol/L — ABNORMAL HIGH (ref 98–111)
Creatinine, Ser: 0.69 mg/dL (ref 0.44–1.00)
GFR, Estimated: >60 mL/min (ref >60)
Glucose, Bld: 124 mg/dL — ABNORMAL HIGH (ref 70–99)
Potassium: 3.4 mmol/L — ABNORMAL LOW (ref 3.5–5.1)
Sodium: 144 mmol/L (ref 135–145)
Total Bilirubin: 0.8 mg/dL (ref 0.0–1.2)
Total Protein: 6.5 g/dL (ref 6.5–8.1)

## 2023-08-30 LAB — URINALYSIS, W/ REFLEX TO CULTURE (INFECTION SUSPECTED)
Bacteria, UA: NONE SEEN
Bilirubin Urine: NEGATIVE
Glucose, UA: NEGATIVE mg/dL
Hgb urine dipstick: NEGATIVE
Ketones, ur: NEGATIVE mg/dL
Leukocytes,Ua: NEGATIVE
Nitrite: NEGATIVE
Protein, ur: NEGATIVE mg/dL
Specific Gravity, Urine: 1.01 (ref 1.005–1.030)
pH: 7 (ref 5.0–8.0)

## 2023-08-30 LAB — VITAMIN B12: Vitamin B-12: 227 pg/mL (ref 180–914)

## 2023-08-30 LAB — CK: Total CK: 894 U/L — ABNORMAL HIGH (ref 38–234)

## 2023-08-30 LAB — CBC
HCT: 36.1 % (ref 36.0–46.0)
Hemoglobin: 11.2 g/dL — ABNORMAL LOW (ref 12.0–15.0)
MCH: 27.8 pg (ref 26.0–34.0)
MCHC: 31 g/dL (ref 30.0–36.0)
MCV: 89.6 fL (ref 80.0–100.0)
Platelets: 198 K/uL (ref 150–400)
RBC: 4.03 MIL/uL (ref 3.87–5.11)
RDW: 13.2 % (ref 11.5–15.5)
WBC: 14.9 K/uL — ABNORMAL HIGH (ref 4.0–10.5)
nRBC: 0 % (ref 0.0–0.2)

## 2023-08-30 LAB — T4, FREE: Free T4: 0.59 ng/dL — ABNORMAL LOW (ref 0.61–1.12)

## 2023-08-30 LAB — TROPONIN I (HIGH SENSITIVITY): Troponin I (High Sensitivity): 6 ng/L (ref <18)

## 2023-08-30 LAB — I-STAT CG4 LACTIC ACID, ED
Lactic Acid, Venous: 1.1 mmol/L (ref 0.5–1.9)
Lactic Acid, Venous: 1.1 mmol/L (ref 0.5–1.9)

## 2023-08-30 LAB — TSH: TSH: 1.103 u[IU]/mL (ref 0.350–4.500)

## 2023-08-30 MED ORDER — LACTATED RINGERS IV BOLUS
1000.0000 mL | Freq: Once | INTRAVENOUS | Status: AC
  Administered 2023-08-30: 1000 mL via INTRAVENOUS

## 2023-08-30 NOTE — ED Provider Notes (Signed)
 [place] EMERGENCY DEPARTMENT AT [facility] Provider Note   CSN: [id number] Arrival date & time: 08/30/23  1323     Patient presents with: Fall and Weakness   Cynthia Cohen is a 58 y.o. female.  {Add pertinent medical, surgical, social history, OB history to HPI:32947} HPI     58 year old female with a history of diabetes, hypertension, hypothyroidism, Tourette syndrome, asthma, hx of multiple intraabdominal surgeries, who presents with concern for multiple falls and weakness.  spouse was knocking and she didn't hear her, then couldn't get up, was falling and took about 30 minutes to get to the door to open the door for her.  Was taking celebrex  for arthritis but was having a stomach ache and stopped taking it for a few days, stomach felt better Has hx of footdrop and sometimes trips but is usually able to help self  Knows she fell because she remembers trying to get up but couldn't, legs were so weak.  Spouse went to get son at 1130, so thinks towards 1130 was when she began to feel this way.   Could not walk, legs giving out. Feeling very sleepy, fatigued.  Head, neck pain.  Neck has been sore because stopped celebrex . No recent falls. Thinks fell twice today. Too weak to get up.  No nausea or vomiting. No fever, no chest pain dyspnea nor cough.  Was having abdominal discomfort, no abdominal pain now or today. No back or flank pain.  No black or bloody stools.   No recent medication changes No smoking, etoh or other drugs Denies numbness, focal weakness, difficulty talking (spouse thought sounded off earlier) visual changes or facial droop.    Past Medical History:  Diagnosis Date   Anxiety    Arthritis    L knee    Asthma    rare exposure to smoke or other irritatnt    Back pain    Clostridium difficile diarrhea    Depression    Diabetes mellitus without complication (HCC)    in combination with hyperinsulinnemia    GERD (gastroesophageal  reflux disease)    Hyperinsulinemia    Hypertension    Hypothyroidism    Neuromuscular disorder (HCC)    tourette syndrome, followed by neurology,neuropathy    Recurrent upper respiratory infection (URI)    Thyroid disease     Past Surgical History:  Procedure Laterality Date   ABDOMINAL SURGERY     ADENOIDECTOMY     APPENDECTOMY     BLADDER SURGERY     bladder tumor removed- 1997,bladder pacemaker - not active at the present time    BOWEL RESECTION     CARDIAC CATHETERIZATION     done for stress related event, in [place] , [place]- told that every thing was ok, punctured femoral artery- treated /w Plasma    CERVICAL ABLATION     CHOLECYSTECTOMY     EXCISIONAL TOTAL KNEE ARTHROPLASTY Left 12/09/2013   Procedure: POLYEXCHANGE WITH SYNOVECTOMY;  Surgeon: Marcey Raman, MD;  Location: [facility] OR;  Service: Orthopedics;  Laterality: Left;   GASTRIC BYPASS     HERNIA REPAIR     ILEOSTOMY REVISION     REVISION TOTAL KNEE ARTHROPLASTY Left 12/09/2013   dr raman   TONSILLECTOMY     TOTAL KNEE ARTHROPLASTY Left 2014   at [facility]      Prior to Admission medications   Medication Sig Start Date End Date Taking? Authorizing Provider  albuterol  (VENTOLIN  HFA) 108 (90 Base) MCG/ACT inhaler Inhale 1-2  puffs into the lungs every 6 (six) hours as needed for wheezing or shortness of breath. 05/24/19   Lamptey, Aleene KIDD, MD  clonazePAM  (KLONOPIN ) 1 MG tablet Take 1 mg by mouth daily as needed for anxiety.     [provider]  desvenlafaxine (PRISTIQ) 50 MG 24 hr tablet Take 50 mg by mouth daily.    [provider]  gabapentin  (NEURONTIN ) 400 MG capsule Take 400 mg by mouth 2 (two) times a day. 06/13/18   [provider]  lamoTRIgine  (LAMICTAL ) 200 MG tablet Take 200 mg by mouth at bedtime.     [provider]  pantoprazole  (PROTONIX ) 20 MG tablet Take 20 mg by mouth daily. 02/05/19   [provider]  ferrous sulfate 325 (65 FE) MG tablet Take 325 mg by mouth daily  with breakfast.  05/24/19  [provider]  fluticasone  (FLONASE ) 50 MCG/ACT nasal spray Place 2 sprays into the nose daily.  06/06/12 05/24/19  [provider]    Allergies: Nitrofurantoin , Metoclopramide, Nitrofurantoin  macrocrystal, and Tape    Review of Systems  Updated Vital Signs BP 110/61 (BP Location: Right Arm)   Pulse 63   Temp 98.8 F (37.1 C) (Oral)   Resp 15   Ht 5' 3 (1.6 m)   Wt 56.7 kg   SpO2 97%   BMI 22.14 kg/m   Physical Exam  (all labs ordered are listed, but only abnormal results are displayed) Labs Reviewed  COMPREHENSIVE METABOLIC PANEL WITH GFR  CBC  URINALYSIS, ROUTINE W REFLEX MICROSCOPIC    EKG: None  Radiology: No results found.  {Document cardiac monitor, telemetry assessment procedure when appropriate:32947} Procedures   Medications Ordered in the ED - No data to display    {Click here for ABCD2, HEART and other calculators REFRESH Note before signing:1}                               58 year old female with a history of diabetes, hypertension, hypothyroidism, Tourette syndrome, asthma, hx of multiple intraabdominal surgeries, who presents with concern for multiple falls and weakness.    Labs completed and personally about interpreted by me show normal lactic acid, mildly elevated CK but not in the range consistent with rhabdomyolysis, TSH within normal limits, free T4 mildly low, troponin within normal limits.  Urinalysis without signs of infection.  Potassium mildly decreased.  Leukocytosis of 14,900 present.  CT head and cervical spine, lumbar spine completed given history of fall and pain to these areas.  CT lumbar spine shows no acute findings, nonobstructing left upper pole renal calculi, sacral stimulator, progressive facet hypertrophy, without any evidence of acute fractures or intracranial hemorrhage   {Document critical care time when appropriate  Document review of labs and clinical decision tools ie  CHADS2VASC2, etc  Document your independent review of radiology images and any outside records  Document your discussion with family members, caretakers and with consultants  Document social determinants of health affecting pt's care  Document your decision making why or why not admission, treatments were needed:32947:::1}   Final diagnoses:  None    ED Discharge Orders     None

## 2023-08-30 NOTE — ED Triage Notes (Signed)
 Patient to ED by home with c/o multiple falls and weakness, patient fell twice today. 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 She denies hitting her head, no thinners and no LOC. She c/o neck pain but states its due to her not taking Celebrex  and c/o right knee pain from fall.  99.6 75 98% RA CBG: 120

## 2023-08-30 NOTE — H&P (Incomplete)
 History and Physical  Cynthia Cohen FMW:[id number] DOB: 04-20-1965 DOA: 08/30/2023  PCP: Patient, No Pcp Per   Chief Complaint: Fall  HPI: Cynthia Cohen is a 58 y.o. female with medical history significant for anxiety and depression, PTSD, ADHD, GERD, tic disorder, foot drop, hypothyroidism, HTN, osteoarthritis, chronic back pain, bradycardia, T2DM and asthma who presented to the ED for evaluation after fall.  Patient reports she was in the restroom and had a fall but does not quite remember how she fell.  She found herself on the floor and could not get up due to significant weakness in her legs.  Her spouse knocked on the door for more than 20 minutes and she was eventually able to crawl to open the door.  Reports she felt lethargic and disoriented and was told by her spouse that she was incoherent before EMS was called.  Reports she has a history of trach pain due to her foot drop but has not had any falls that she can remember.  She endorsed neck pain after recently stopping her Celebrex  but denies any fevers, chills, headache, vision changes, chest pain, shortness of breath, numbness, tingling or palpitations.  ED Course: Initial vitals show patient afebrile, HR 50s-60s, SBP 100-120s, SpO2 97% on room air. Initial labs significant for WBC 14.9, Hgb 11.2, K+ 3.4, blood glucose 124, normal kidney function, CK 894, normal troponin and lactic acid. EKG shows sinus bradycardia. CXR shows no active disease. CT head with no acute intracranial abnormality. CT L-spine with no acute findings. CT C-spine shows progressive facet hypertrophy but no acute findings. Pt received IV LR 1 L bolus. Neurology was consulted for evaluation. TRH was consulted for admission.   Review of Systems: Please see HPI for pertinent positives and negatives. A complete 10 system review of systems are otherwise negative.  Past Medical History:  Diagnosis Date   Anxiety    Arthritis    L knee    Asthma    rare exposure to  smoke or other irritatnt    Back pain    Clostridium difficile diarrhea    Depression    Diabetes mellitus without complication (HCC)    in combination with hyperinsulinnemia    GERD (gastroesophageal reflux disease)    Hyperinsulinemia    Hypertension    Hypothyroidism    Neuromuscular disorder (HCC)    tourette syndrome, followed by neurology,neuropathy    Recurrent upper respiratory infection (URI)    Thyroid disease    Past Surgical History:  Procedure Laterality Date   ABDOMINAL SURGERY     ADENOIDECTOMY     APPENDECTOMY     BLADDER SURGERY     bladder tumor removed- 1997,bladder pacemaker - not active at the present time    BOWEL RESECTION     CARDIAC CATHETERIZATION     done for stress related event, in [place] , [place]- told that every thing was ok, punctured femoral artery- treated /w Plasma    CERVICAL ABLATION     CHOLECYSTECTOMY     EXCISIONAL TOTAL KNEE ARTHROPLASTY Left 12/09/2013   Procedure: POLYEXCHANGE WITH SYNOVECTOMY;  Surgeon: Marcey Raman, MD;  Location: [facility] OR;  Service: Orthopedics;  Laterality: Left;   GASTRIC BYPASS     HERNIA REPAIR     ILEOSTOMY REVISION     REVISION TOTAL KNEE ARTHROPLASTY Left 12/09/2013   dr raman   TONSILLECTOMY     TOTAL KNEE ARTHROPLASTY Left 2014   at [facility]    Social History:  reports that she has  never smoked. She has been exposed to tobacco smoke. She has never used smokeless tobacco. She reports that she does not drink alcohol and does not use drugs.  Allergies  Allergen Reactions   Aripiprazole Other (See Comments)    Cervical dystonia and worsening of tic disorder   Metoclopramide Anaphylaxis and Other (See Comments)    Jitters, mental status change, tremors, agitation, muscle movements (too)   Nitrofurantoin  Dermatitis and Rash   Celebrex  [Celecoxib ] Other (See Comments)    Patient states that it started to make her stomach sore   Tape Dermatitis and Rash    Localized adhesive reaction. Paper tape is okay.    Wound Dressing Adhesive Dermatitis and Other (See Comments)    Localized adhesive reaction    Family History  Problem Relation Age of Onset   Diabetes Mother    Hypertension Mother    Allergic rhinitis Mother    Aneurysm Mother    Anxiety disorder Father    Alcohol abuse Father    Parkinson's disease Father      Prior to Admission medications   Medication Sig Start Date End Date Taking? Authorizing Provider  albuterol  (VENTOLIN  HFA) 108 (90 Base) MCG/ACT inhaler Inhale 1-2 puffs into the lungs every 6 (six) hours as needed for wheezing or shortness of breath. 05/24/19   LampteyAleene KIDD, MD  clonazePAM  (KLONOPIN ) 1 MG tablet Take 1 mg by mouth daily as needed for anxiety.     [provider]  desvenlafaxine (PRISTIQ) 50 MG 24 hr tablet Take 50 mg by mouth daily.    [provider]  gabapentin  (NEURONTIN ) 400 MG capsule Take 400 mg by mouth 2 (two) times a day. 06/13/18   [provider]  lamoTRIgine  (LAMICTAL ) 200 MG tablet Take 200 mg by mouth at bedtime.     [provider]  pantoprazole  (PROTONIX ) 20 MG tablet Take 20 mg by mouth daily. 02/05/19   [provider]  ferrous sulfate 325 (65 FE) MG tablet Take 325 mg by mouth daily with breakfast.  05/24/19  [provider]  fluticasone  (FLONASE ) 50 MCG/ACT nasal spray Place 2 sprays into the nose daily.  06/06/12 05/24/19  [provider]    Physical Exam: BP 131/72   Pulse (!) 52   Temp 98.2 F (36.8 C) (Oral)   Resp 16   Ht 5' 3 (1.6 m)   Wt 56.7 kg   SpO2 100%   BMI 22.14 kg/m  General: Pleasant, weak appearing middle-aged woman laying in bed. No acute distress. HEENT: [place]/AT. Anicteric sclera. EOMI. PERRLA. CV: RRR. No murmurs, rubs, or gallops. No LE edema Pulmonary: Lungs CTAB. Normal effort. No wheezing or rales. Abdominal: Soft, nontender, nondistended. Normal bowel sounds. Extremities: Palpable radial and DP pulses. Normal ROM. Skin: Warm and dry. No  obvious rash or lesions. Neuro: A&Ox3. Speech is clear and comprehensible. No facial asymmetry. Normal FTN. HTN slower on the right. Strength 5/5 in BUE, 4/5 in LLE and 3/5 in RLE. Normal sensation to light touch.  Psych: Normal mood and affect          Labs on Admission:  Basic Metabolic Panel: Recent Labs  Lab 08/30/23 1517  NA 144  K 3.4*  CL 115*  CO2 23  GLUCOSE 124*  BUN 13  CREATININE 0.69  CALCIUM 8.7*   Liver Function Tests: Recent Labs  Lab 08/30/23 1517  AST 25  ALT 10  ALKPHOS 74  BILITOT 0.8  PROT 6.5  ALBUMIN 3.2*  No results for input(s): LIPASE, AMYLASE in the last 168 hours. No results for input(s): AMMONIA in the last 168 hours. CBC: Recent Labs  Lab 08/30/23 1517  WBC 14.9*  HGB 11.2*  HCT 36.1  MCV 89.6  PLT 198   Cardiac Enzymes: Recent Labs  Lab 08/30/23 1746  CKTOTAL 894*   BNP (last 3 results) No results for input(s): BNP in the last 8760 hours.  ProBNP (last 3 results) No results for input(s): PROBNP in the last 8760 hours.  CBG: No results for input(s): GLUCAP in the last 168 hours.  Radiological Exams on Admission: CT Lumbar Spine Wo Contrast Result Date: 08/30/2023 EXAM: CT OF THE LUMBAR SPINE WITHOUT CONTRAST 08/30/2023 06:38:13 PM TECHNIQUE: CT of the lumbar spine was performed without the administration of intravenous contrast. Multiplanar reformatted images are provided for review. Automated exposure control, iterative reconstruction, and/or weight based adjustment of the mA/kV was utilized to reduce the radiation dose to as low as reasonably achievable. COMPARISON: 08/31/2021 CLINICAL HISTORY: Low back pain, trauma. c/o multiple falls and weakness, patient fell twice today. 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 She denies hitting her head, no thinners and no LOC. She c/o neck pain but states its due to her not taking Celebrex  and c/o right knee pain from fall. FINDINGS:  BONES AND ALIGNMENT: Normal vertebral body heights. No acute fracture or suspicious bone lesion. Normal alignment. DEGENERATIVE CHANGES: Mild degenerative changes, most prominent at L1-2, unchanged. SOFT TISSUES: No acute abnormality. Status post cholecystectomy. Nonobstructing left upper pole renal calculi measuring up to 10 mm (image 22), without hydronephrosis. Sacral stimulator. IMPRESSION: 1. No acute findings. Electronically signed by: Pinkie Pebbles MD 08/30/2023 07:04 PM EDT RP Workstation: [id number]   CT Head Wo Contrast Result Date: 08/30/2023 EXAM: CT HEAD WITHOUT CONTRAST 08/30/2023 06:38:13 PM TECHNIQUE: CT of the head was performed without the administration of intravenous contrast. Automated exposure control, iterative reconstruction, and/or weight based adjustment of the mA/kV was utilized to reduce the radiation dose to as low as reasonably achievable. COMPARISON: 07/19/2018 CLINICAL HISTORY: Head trauma, abnormal mental status (Age 42-64y). c/o multiple falls and weakness, patient fell twice today. 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 She denies hitting her head, no thinners and no LOC. She c/o neck pain but states its due to her not taking Celebrex  and c/o right knee pain from fall. FINDINGS: BRAIN AND VENTRICLES: No acute hemorrhage. Gray-white differentiation is preserved. No hydrocephalus. No extra-axial collection. No mass effect or midline shift. ORBITS: No acute abnormality. SINUSES: No acute abnormality. SOFT TISSUES AND SKULL: No acute soft tissue abnormality. No skull fracture. IMPRESSION: 1. No acute intracranial abnormality. Electronically signed by: Pinkie Pebbles MD 08/30/2023 07:01 PM EDT RP Workstation: [id number]   CT Cervical Spine Wo Contrast Result Date: 08/30/2023 CLINICAL DATA:  Neck trauma, uncomplicated (NEXUS/CCR neg) (Age 63-64y) Multiple falls with weakness. EXAM: CT CERVICAL SPINE WITHOUT CONTRAST TECHNIQUE:  Multidetector CT imaging of the cervical spine was performed without intravenous contrast. Multiplanar CT image reconstructions were also generated. RADIATION DOSE REDUCTION: This exam was performed according to the departmental dose-optimization program which includes automated exposure control, adjustment of the mA and/or kV according to patient size and/or use of iterative reconstruction technique. COMPARISON:  CT cervical spine 07/08/2021 FINDINGS: Alignment: Convex right cervicothoracic scoliosis with near anatomic lateral alignment. Skull base and vertebrae: No evidence of acute cervical spine fracture or traumatic subluxation. Soft tissues and  spinal canal: No prevertebral fluid or swelling. No visible canal hematoma. Disc levels: Mild spondylosis with relatively preserved disc height. Scattered progressive facet hypertrophy, greatest on the right at C2-3 and T1-2 and on the left at C4-5. No high-grade foraminal narrowing. Upper chest: Clear lung apices. Other: None. IMPRESSION: 1. No evidence of acute cervical spine fracture, traumatic subluxation or static signs of instability. 2. Progressive facet hypertrophy as described which may contribute to neck pain. Electronically Signed   By: Elsie Perone M.D.   On: 08/30/2023 18:57   DG Chest Portable 1 View Result Date: 08/30/2023 CLINICAL DATA:  Fall.  Weakness. EXAM: PORTABLE CHEST 1 VIEW COMPARISON:  12/14/2021. FINDINGS: Trachea is midline. Heart size is accentuated by AP semi upright technique. Lungs are clear. No pleural fluid. Old right rib fractures. IMPRESSION: No acute findings. Electronically Signed   By: Newell Eke M.D.   On: 08/30/2023 17:52   Assessment/Plan Cynthia Cohen is a 58 y.o. female with medical history significant for  anxiety and depression, PTSD, ADHD, GERD, foot drop, tic disorder, hypothyroidism, HTN, osteoarthritis, chronic back pain, bradycardia, T2DM and asthma who presented to the ED for evaluation after fall.   #  Fall # Generalized weakness - Presented with leg weakness and a fall - No evidence of acute infection to explain her generalized weakness - Pt with lower extremity weakness on exam, R worst than L, difficulty ambulating in the ED - CT head negative, unable to obtain MRI brain due to bladder stimulator - Neurology consulted, recommends transfer to [facility] - Follow-up further neurology recs - PT/OT eval and treat - Fall precautions  # T2DM - Reports her A1c has been at goal for many years - Currently on vegetarian diet and does not want carb modified diet which limits her options - Blood glucose of 124 on CMP - SSI with meals, CBG monitoring - Follow-up repeat A1c  # Elevated CK - CK slightly elevated to 894 in the setting of traumatic fall - IV hydration with IV NS at 100 cc/h - Follow-up morning CK  # Leukocytosis - WBC of 14.9, no systemic signs of infection, patient afebrile - Likely reactive in the setting of her fall - Trend CBC  # Anxiety and depression # Tic disorder # PTSD - Continue risperidone , prazosin , lamotrigine , clonazepam  and Vilazodone   # Vitamin D  deficiency - Vitamin D  level was 21.5 3 weeks ago - Continue vitamin D  supplements  # Chronic back pain - Continue gabapentin  and as needed Robaxin   # GERD - Continue Nexium   DVT prophylaxis: Lovenox      Code Status: Full Code  Consults called: Neurology  Family Communication: No family at bedside  Severity of Illness: The appropriate patient status for this patient is OBSERVATION. Observation status is judged to be reasonable and necessary in order to provide the required intensity of service to ensure the patient's safety. The patient's presenting symptoms, physical exam findings, and initial radiographic and laboratory data in the context of their medical condition is felt to place them at decreased risk for further clinical deterioration. Furthermore, it is anticipated that the patient will be medically  stable for discharge from the hospital within 2 midnights of admission.   Level of care: Telemetry Medical   This record has been created using Conservation officer, historic buildings. Errors have been sought and corrected, but may not always be located. Such creation errors do not reflect on the standard of care.   Lou Claretta HERO, MD 08/31/2023, 2:32 AM [facility] Pager: [phone] Isaiah 41:10   If 7PM-7AM, please contact night-coverage [email or web address] Password [email or web address]

## 2023-08-31 ENCOUNTER — Observation Stay (HOSPITAL_COMMUNITY)

## 2023-08-31 ENCOUNTER — Encounter (HOSPITAL_COMMUNITY): Payer: Self-pay | Admitting: Student

## 2023-08-31 DIAGNOSIS — K219 Gastro-esophageal reflux disease without esophagitis: Secondary | ICD-10-CM | POA: Diagnosis not present

## 2023-08-31 DIAGNOSIS — R569 Unspecified convulsions: Secondary | ICD-10-CM | POA: Diagnosis not present

## 2023-08-31 DIAGNOSIS — R55 Syncope and collapse: Secondary | ICD-10-CM | POA: Diagnosis not present

## 2023-08-31 DIAGNOSIS — F32A Depression, unspecified: Secondary | ICD-10-CM | POA: Diagnosis not present

## 2023-08-31 DIAGNOSIS — F4310 Post-traumatic stress disorder, unspecified: Secondary | ICD-10-CM | POA: Diagnosis not present

## 2023-08-31 DIAGNOSIS — W19XXXA Unspecified fall, initial encounter: Secondary | ICD-10-CM | POA: Diagnosis not present

## 2023-08-31 DIAGNOSIS — R748 Abnormal levels of other serum enzymes: Secondary | ICD-10-CM | POA: Diagnosis not present

## 2023-08-31 DIAGNOSIS — E559 Vitamin D deficiency, unspecified: Secondary | ICD-10-CM | POA: Diagnosis not present

## 2023-08-31 DIAGNOSIS — F840 Autistic disorder: Secondary | ICD-10-CM | POA: Diagnosis not present

## 2023-08-31 DIAGNOSIS — I639 Cerebral infarction, unspecified: Secondary | ICD-10-CM

## 2023-08-31 DIAGNOSIS — R29898 Other symptoms and signs involving the musculoskeletal system: Secondary | ICD-10-CM

## 2023-08-31 DIAGNOSIS — M549 Dorsalgia, unspecified: Secondary | ICD-10-CM | POA: Diagnosis not present

## 2023-08-31 DIAGNOSIS — Z043 Encounter for examination and observation following other accident: Secondary | ICD-10-CM | POA: Diagnosis present

## 2023-08-31 DIAGNOSIS — R471 Dysarthria and anarthria: Secondary | ICD-10-CM | POA: Diagnosis present

## 2023-08-31 DIAGNOSIS — E119 Type 2 diabetes mellitus without complications: Secondary | ICD-10-CM | POA: Diagnosis not present

## 2023-08-31 DIAGNOSIS — Z1152 Encounter for screening for COVID-19: Secondary | ICD-10-CM | POA: Diagnosis not present

## 2023-08-31 DIAGNOSIS — D72829 Elevated white blood cell count, unspecified: Secondary | ICD-10-CM | POA: Diagnosis not present

## 2023-08-31 DIAGNOSIS — J45909 Unspecified asthma, uncomplicated: Secondary | ICD-10-CM | POA: Diagnosis not present

## 2023-08-31 DIAGNOSIS — R531 Weakness: Secondary | ICD-10-CM

## 2023-08-31 DIAGNOSIS — F419 Anxiety disorder, unspecified: Secondary | ICD-10-CM | POA: Diagnosis not present

## 2023-08-31 DIAGNOSIS — G8929 Other chronic pain: Secondary | ICD-10-CM | POA: Diagnosis not present

## 2023-08-31 LAB — CBG MONITORING, ED: Glucose-Capillary: 86 mg/dL (ref 70–99)

## 2023-08-31 LAB — LIPID PANEL
Cholesterol: 150 mg/dL (ref 0–200)
HDL: 69 mg/dL (ref >40)
LDL Cholesterol: 71 mg/dL (ref 0–99)
Total CHOL/HDL Ratio: 2.2 ratio
Triglycerides: 49 mg/dL (ref <150)
VLDL: 10 mg/dL (ref 0–40)

## 2023-08-31 LAB — CBC
HCT: 34.1 % — ABNORMAL LOW (ref 36.0–46.0)
Hemoglobin: 10.6 g/dL — ABNORMAL LOW (ref 12.0–15.0)
MCH: 27.8 pg (ref 26.0–34.0)
MCHC: 31.1 g/dL (ref 30.0–36.0)
MCV: 89.5 fL (ref 80.0–100.0)
Platelets: 175 K/uL (ref 150–400)
RBC: 3.81 MIL/uL — ABNORMAL LOW (ref 3.87–5.11)
RDW: 13.2 % (ref 11.5–15.5)
WBC: 8.4 K/uL (ref 4.0–10.5)
nRBC: 0 % (ref 0.0–0.2)

## 2023-08-31 LAB — BASIC METABOLIC PANEL WITH GFR
Anion gap: 6 (ref 5–15)
BUN: 9 mg/dL (ref 6–20)
CO2: 18 mmol/L — ABNORMAL LOW (ref 22–32)
Calcium: 8.2 mg/dL — ABNORMAL LOW (ref 8.9–10.3)
Chloride: 116 mmol/L — ABNORMAL HIGH (ref 98–111)
Creatinine, Ser: 0.5 mg/dL (ref 0.44–1.00)
GFR, Estimated: >60 mL/min (ref >60)
Glucose, Bld: 94 mg/dL (ref 70–99)
Potassium: 3.5 mmol/L (ref 3.5–5.1)
Sodium: 140 mmol/L (ref 135–145)

## 2023-08-31 LAB — HEMOGLOBIN A1C
Hgb A1c MFr Bld: 5.1 % (ref 4.8–5.6)
Mean Plasma Glucose: 99.67 mg/dL

## 2023-08-31 LAB — CK: Total CK: 722 U/L — ABNORMAL HIGH (ref 38–234)

## 2023-08-31 LAB — VITAMIN B12: Vitamin B-12: 276 pg/mL (ref 180–914)

## 2023-08-31 LAB — GLUCOSE, CAPILLARY: Glucose-Capillary: 133 mg/dL — ABNORMAL HIGH (ref 70–99)

## 2023-08-31 LAB — RPR: RPR Ser Ql: NONREACTIVE

## 2023-08-31 LAB — HIV ANTIBODY (ROUTINE TESTING W REFLEX): HIV Screen 4th Generation wRfx: NONREACTIVE

## 2023-08-31 LAB — FOLATE: Folate: 19.4 ng/mL (ref >5.9)

## 2023-08-31 MED ORDER — IOHEXOL 350 MG/ML SOLN
100.0000 mL | Freq: Once | INTRAVENOUS | Status: AC | PRN
Start: 2023-08-31 — End: 2023-08-31
  Administered 2023-08-31: 100 mL via INTRAVENOUS

## 2023-08-31 MED ORDER — PANTOPRAZOLE SODIUM 40 MG PO TBEC
40.0000 mg | DELAYED_RELEASE_TABLET | Freq: Every day | ORAL | Status: DC
  Administered 2023-08-31 – 2023-09-02 (×3): 40 mg via ORAL
  Filled 2023-08-31 (×2): qty 1

## 2023-08-31 MED ORDER — TOPIRAMATE 100 MG PO TABS
100.0000 mg | ORAL_TABLET | Freq: Two times a day (BID) | ORAL | Status: DC
  Administered 2023-08-31 – 2023-09-02 (×6): 100 mg via ORAL
  Filled 2023-08-31 (×6): qty 1

## 2023-08-31 MED ORDER — PRAZOSIN HCL 2 MG PO CAPS
4.0000 mg | ORAL_CAPSULE | Freq: Every day | ORAL | Status: DC
  Administered 2023-08-31 – 2023-09-01 (×3): 4 mg via ORAL
  Filled 2023-08-31: qty 4
  Filled 2023-08-31 (×2): qty 2

## 2023-08-31 MED ORDER — ACETAMINOPHEN 325 MG PO TABS
650.0000 mg | ORAL_TABLET | Freq: Four times a day (QID) | ORAL | Status: DC | PRN
  Administered 2023-08-31 – 2023-09-01 (×2): 650 mg via ORAL
  Filled 2023-08-31 (×2): qty 2

## 2023-08-31 MED ORDER — POTASSIUM CHLORIDE CRYS ER 20 MEQ PO TBCR
40.0000 meq | EXTENDED_RELEASE_TABLET | Freq: Once | ORAL | Status: AC
Start: 2023-08-31 — End: 2023-08-31
  Administered 2023-08-31: 40 meq via ORAL
  Filled 2023-08-31: qty 2

## 2023-08-31 MED ORDER — VITAMIN D 25 MCG (1000 UNIT) PO TABS
1000.0000 [IU] | ORAL_TABLET | Freq: Every day | ORAL | Status: DC
  Administered 2023-08-31 – 2023-09-02 (×3): 1000 [IU] via ORAL
  Filled 2023-08-31 (×3): qty 1

## 2023-08-31 MED ORDER — ENOXAPARIN SODIUM 40 MG/0.4ML IJ SOSY
40.0000 mg | PREFILLED_SYRINGE | INTRAMUSCULAR | Status: DC
  Administered 2023-08-31 – 2023-09-02 (×3): 40 mg via SUBCUTANEOUS
  Filled 2023-08-31 (×3): qty 0.4

## 2023-08-31 MED ORDER — OXYCODONE HCL 5 MG PO TABS
5.0000 mg | ORAL_TABLET | Freq: Once | ORAL | Status: AC
  Administered 2023-08-31: 5 mg via ORAL
  Filled 2023-08-31: qty 1

## 2023-08-31 MED ORDER — LACTATED RINGERS IV SOLN: INTRAVENOUS | Status: AC

## 2023-08-31 MED ORDER — CLONAZEPAM 1 MG PO TABS
1.0000 mg | ORAL_TABLET | Freq: Three times a day (TID) | ORAL | Status: DC
  Administered 2023-08-31 – 2023-09-02 (×7): 1 mg via ORAL
  Filled 2023-08-31 (×5): qty 1
  Filled 2023-08-31: qty 2
  Filled 2023-08-31: qty 1

## 2023-08-31 MED ORDER — METHOCARBAMOL 750 MG PO TABS
750.0000 mg | ORAL_TABLET | Freq: Three times a day (TID) | ORAL | Status: DC | PRN
  Administered 2023-08-31 – 2023-09-01 (×3): 750 mg via ORAL
  Filled 2023-08-31: qty 1
  Filled 2023-08-31: qty 2
  Filled 2023-08-31: qty 1

## 2023-08-31 MED ORDER — METHYLPHENIDATE HCL ER (OSM) 27 MG PO TBCR
54.0000 mg | EXTENDED_RELEASE_TABLET | Freq: Every morning | ORAL | Status: DC
  Administered 2023-09-01 – 2023-09-02 (×2): 54 mg via ORAL
  Filled 2023-08-31 (×2): qty 2

## 2023-08-31 MED ORDER — SENNOSIDES-DOCUSATE SODIUM 8.6-50 MG PO TABS: 1.0000 | ORAL_TABLET | Freq: Every evening | ORAL | Status: DC | PRN

## 2023-08-31 MED ORDER — VILAZODONE HCL 20 MG PO TABS
40.0000 mg | ORAL_TABLET | Freq: Every day | ORAL | Status: DC
  Administered 2023-09-01 – 2023-09-02 (×2): 40 mg via ORAL
  Filled 2023-08-31 (×2): qty 2

## 2023-08-31 MED ORDER — LAMOTRIGINE 100 MG PO TABS
300.0000 mg | ORAL_TABLET | Freq: Every day | ORAL | Status: DC
  Administered 2023-08-31 – 2023-09-02 (×3): 300 mg via ORAL
  Filled 2023-08-31 (×3): qty 3

## 2023-08-31 MED ORDER — RISPERIDONE 0.5 MG PO TABS
0.2500 mg | ORAL_TABLET | Freq: Two times a day (BID) | ORAL | Status: DC
  Administered 2023-08-31 – 2023-09-02 (×6): 0.25 mg via ORAL
  Filled 2023-08-31 (×6): qty 1

## 2023-08-31 MED ORDER — ONDANSETRON HCL 4 MG/2ML IJ SOLN: 4.0000 mg | Freq: Four times a day (QID) | INTRAMUSCULAR | Status: DC | PRN

## 2023-08-31 MED ORDER — ONDANSETRON HCL 4 MG PO TABS: 4.0000 mg | ORAL_TABLET | Freq: Four times a day (QID) | ORAL | Status: DC | PRN

## 2023-08-31 MED ORDER — ESOMEPRAZOLE MAGNESIUM 10 MG PO PACK: 10.0000 mg | PACK | Freq: Every day | ORAL | Status: DC

## 2023-08-31 MED ORDER — GABAPENTIN 400 MG PO CAPS
400.0000 mg | ORAL_CAPSULE | Freq: Two times a day (BID) | ORAL | Status: DC
Start: 2023-08-31 — End: 2023-09-02
  Administered 2023-08-31 – 2023-09-02 (×6): 400 mg via ORAL
  Filled 2023-08-31 (×6): qty 1

## 2023-08-31 MED ORDER — ACETAMINOPHEN 650 MG RE SUPP: 650.0000 mg | Freq: Four times a day (QID) | RECTAL | Status: DC | PRN

## 2023-08-31 MED ORDER — INSULIN ASPART 100 UNIT/ML IJ SOLN
0.0000 [IU] | Freq: Three times a day (TID) | INTRAMUSCULAR | Status: DC
  Filled 2023-08-31: qty 0.15

## 2023-08-31 NOTE — Hospital Course (Addendum)
 58 y.o. female with medical history significant for  anxiety and depression, PTSD, ADHD, GERD, foot drop, tic disorder, hypothyroidism, HTN, osteoarthritis, chronic back pain, bradycardia, T2DM and asthma who presented to the ED for evaluation after fall.

## 2023-08-31 NOTE — Progress Notes (Signed)
  Progress Note   Patient: Cynthia Cohen FMW:[id number] DOB: 10-02-1965 DOA: 08/30/2023     0 DOS: the patient was seen and examined on 08/31/2023   Brief hospital course: 58 y.o. female with medical history significant for  anxiety and depression, PTSD, ADHD, GERD, foot drop, tic disorder, hypothyroidism, HTN, osteoarthritis, chronic back pain, bradycardia, T2DM and asthma who presented to the ED for evaluation after fall.   Assessment and Plan: # Fall # Generalized weakness - Presented with leg weakness and a fall - Pt describes walking to bathroom, followed by waking up on floor with LE weakness and confusion - No evidence of acute infection to explain her generalized weakness - CT head negative - Neurology consulted, discussed with Neurology, recs to transfer to [facility] - Neurology recs for CTA head/neck, repeat CT head 24h after initial study - f/u on 2d echo - PT/OT eval and treat   # T2DM - A1c 5.1 - SSI with meals, CBG monitoring   # Elevated CK - CK slightly elevated to 894 in the setting of traumatic fall - cont IVF hydration as needed -Recheck CK in AM   # Leukocytosis - On presentation, WBC of 14.9, no systemic signs of infection, patient afebrile - Likely reactive in the setting of her fall - Normalized   # Anxiety and depression # Tic disorder # PTSD - Continue risperidone , prazosin , lamotrigine , clonazepam  and Vilazodone    # Vitamin D  deficiency - Vitamin D  level was 21.5 3 weeks ago - Continue vitamin D  supplements   # Chronic back pain - Continue gabapentin  and as needed Robaxin    # GERD - Continue Nexium       Subjective: Reports feeling better today. RLE still weak. Mentation is now near baseline  Physical Exam: Vitals:   08/31/23 0747 08/31/23 0945 08/31/23 1148 08/31/23 1247  BP:  104/65  (!) 103/45  Pulse:  66  71  Resp:  16  16  Temp: 98.3 F (36.8 C)  98.1 F (36.7 C) 99 F (37.2 C)  TempSrc: Oral  Oral Oral  SpO2:  100%  98%   Weight:      Height:       General exam: Awake, laying in bed, in nad Respiratory system: Normal respiratory effort, no wheezing Cardiovascular system: regular rate, s1, s2 Gastrointestinal system: Soft, nondistended, positive BS Central nervous system: CN2-12 grossly intact, 4/5 strength with RLE raise Extremities: Perfused, no clubbing Skin: Normal skin turgor, no notable skin lesions seen Psychiatry: Mood normal // no visual hallucinations   Data Reviewed:  Labs reviewed: Na 140, K 3.5, Cr 0.5, WBC 8.4, Hgb 10.6, Plts 175  Family Communication: Pt in room, family not at bedside  Disposition: Status is: Observation The patient remains OBS appropriate and will d/c before 2 midnights.  Planned Discharge Destination: Home    Author: Garnette Pelt, MD 08/31/2023 3:31 PM  For on call review [email or web address].

## 2023-08-31 NOTE — CV Procedure (Signed)
 This patient has a bladder stimulator. The problem is that her card that was scanned into her chart does not provide us  with lead numbers that we need in order to make sure the stimulator is compatible with MRI. Additionally, we were told that they patient does not have the remote that goes with the stimulator. If you are still wanting the MRI exam then we will need both the lead number and the remote control to be fully charged and functional.   Per her PA-C, Kristi Kehoe, this patient does not have a remote with them. Until we have a remote that is fully charged and are able to confirm whether it is compatible or not we cannot proceed. Order will be canceled at this time. If more information is obtained the order can be put in again.

## 2023-08-31 NOTE — Consult Note (Addendum)
 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  She does not use illicit substances, drink, or use tobacco.  History is obtained from patient and dino was unable to reach her spouse at time of this documentation.  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  The next thing she remembers, she was on the floor and both of her legs felt too weak for her to stand upright.  Her wife attempted to gain access to the bathroom but the door was locked.  Wife states that patient's speech was incoherent and the speech changes seemed to have lasted at least several hours, resolving this morning, per patient.  No history of seizures or similar episodes, no tongue biting or urinary/bowel incontinence, loss of time, etc.  She denies recent head injuries, vision changes, problems swallowing or speaking prior to this, headaches, or preceding focal muscle weakness or paresthesias.  She does have baseline right foot drop that is accompanied by pain, but now, she feels back to baseline aside from continued right lower extremity weakness including all muscle groups and without pain.  No recent medication changes were made recently to her depression, mood stabilization, ADHD medications.  Viibryd  is a new medication as of 3 months ago - she has not noted any side  effects on this.  She was hospitalized at that time for major depressive disorder flare with hypoactive symptoms.  She takes Robaxin  as needed for neck pain.  She recently stopped taking Celebrex  for the same, as she was told she is not supposed to take this after gastric bypass surgery years ago.  She denies accidental or intentional overdose of any of her medications.  ROS: Full ROS was performed and is negative except as noted in the HPI. [name]   PAST MEDICAL HISTORY    Past Medical History:  Past Medical History:  Diagnosis Date   Anxiety    Arthritis    L knee    Asthma    rare exposure to smoke or other irritatnt    Back pain    Clostridium difficile diarrhea    Depression    Diabetes mellitus without complication (HCC)    in combination with hyperinsulinnemia    GERD (gastroesophageal reflux disease)    Hyperinsulinemia    Hypertension    Hypothyroidism    Neuromuscular disorder (HCC)    tourette syndrome, followed by neurology,neuropathy    Recurrent upper respiratory infection (URI)    Thyroid disease     No family history on file. Family History  Problem Relation Age of Onset   Diabetes Mother    Hypertension Mother    Allergic rhinitis Mother    Aneurysm Mother    Anxiety disorder Father    Alcohol abuse Father    Parkinson's disease Father     Allergies:  Allergies  Allergen Reactions   Aripiprazole Other (See Comments)    Cervical dystonia and worsening of tic disorder  Metoclopramide Anaphylaxis and Other (See Comments)    Jitters, mental status change, tremors, agitation, muscle movements (too)   Nitrofurantoin  Dermatitis and Rash   Celebrex  [Celecoxib ] Other (See Comments)    Patient states that it started to make her stomach sore   Tape Dermatitis and Rash    Localized adhesive reaction. Paper tape is okay.   Wound Dressing Adhesive Dermatitis and Other (See Comments)    Localized adhesive reaction    Social History:   reports that she has never  smoked. She has been exposed to tobacco smoke. She has never used smokeless tobacco. She reports that she does not drink alcohol and does not use drugs.    Medications Medications Prior to Admission  Medication Sig Dispense Refill   clonazePAM  (KLONOPIN ) 1 MG tablet Take 1 mg by mouth 3 (three) times daily.     esomeprazole  (NEXIUM ) 10 MG packet Take 10 mg by mouth daily before breakfast.     gabapentin  (NEURONTIN ) 400 MG capsule Take 400 mg by mouth 2 (two) times a day.     lamoTRIgine  (LAMICTAL ) 200 MG tablet Take 300 mg by mouth daily.     methocarbamol  (ROBAXIN ) 750 MG tablet Take 750 mg by mouth 3 (three) times daily as needed.     methylphenidate  (RITALIN ) 10 MG tablet Take 10 mg by mouth daily.     methylphenidate  54 MG PO CR tablet Take 54 mg by mouth every morning.     prazosin  (MINIPRESS ) 2 MG capsule Take 4 mg by mouth at bedtime.     risperiDONE  (RISPERDAL ) 0.25 MG tablet Take 0.25 mg by mouth 2 (two) times daily.     topiramate  (TOPAMAX ) 100 MG tablet Take 100 mg by mouth 2 (two) times daily.     traMADol (ULTRAM) 50 MG tablet Take 50 mg by mouth as needed for moderate pain (pain score 4-6).     traZODone  (DESYREL ) 100 MG tablet Take 50-100 mg by mouth at bedtime as needed for sleep.     Vilazodone  HCl (VIIBRYD ) 40 MG TABS Take 40 mg by mouth daily.     albuterol  (VENTOLIN  HFA) 108 (90 Base) MCG/ACT inhaler Inhale 1-2 puffs into the lungs every 6 (six) hours as needed for wheezing or shortness of breath. 18 g 0    EXAMINATION    Current vital signs:    08/31/2023   12:47 PM 08/31/2023    9:45 AM 08/31/2023    6:45 AM  Vitals with BMI  Systolic 103 104 886  Diastolic 45 65 66  Pulse 71 66 55    Examination:  GENERAL: Awake, alert in NAD.  HEENT: - Normocephalic and atraumatic, dry mm, no lymphadenopathy, no thyromegaly ABDOMEN - Soft, nontender, nondistended with normoactive BS Ext: warm, well perfused, intact peripheral pulses, no pedal edema  NEURO:  Mental  Status: AA&Ox4 Language: speech is clear.  In tact naming, repetition, fluency, and comprehension.  Cranial Nerves:  II: PERRL. Visual fields full to confrontation.  III, IV, VI: EOM in tact.  Initially queried right ptosis but good strength against resistance both eye opening and eye closure.  V: Sensation intact V1-3 symmetrically  VII: no facial asymmetry   VIII: hearing intact to voice IX, X: Palate elevates symmetrically. Phonation is normal.  KP:Dynloizm shrug 5/5 and symmetrical  XII: tongue is midline without fasciculations. Motor:   Mvmt Root Nerve  Muscle Right Left Comments  SA C5/6 Ax Deltoid 5 5   EF C5/6 Mc Biceps 5 5  EE C6/7/8 Rad Triceps 5 5   WF C6/7 Med FCR 5 5   WE C7/8 PIN ECU 5 5   F Ab C8/T1 U ADM/FDI x 5   HF L1/2/3 Fem Illopsoas 4- 5   KE L2/3/4 Fem Quad 4 5   DF L4/5 D Peron Tib Ant 4 5   PF S1/2 Tibial Grc/Sol 4 5    Reflexes:  Right Left Comments  Pectoralis x x    Biceps (C5/6) 1 1   Brachioradialis (C5/6) 1 1    Triceps (C6/7) 1 1    Patellar (L3/4) 2 2    Achilles (S1) 2 2    Hoffman neg neg    Plantar Norm, downgoing Norm, downgoing   Jaw jerk x   Sensation:  Light touch Intact   Pin prick Intact   Temperature deferred   Vibration deferred  Proprioception Intact    Tone: is normal and bulk is normal Coordination: FTN intact bilaterally, no ataxia out of proportion to weakness ,  Intermittent eye closing tics Gait-  Deferred  NIHSS 1 for RLE weakness   LABS   I have reviewed labs in epic and the results pertinent to this consultation are:   No results found for: [facility] Lab Results  Component Value Date   ALT 10 08/30/2023   AST 25 08/30/2023   ALKPHOS 74 08/30/2023   BILITOT 0.8 08/30/2023   Lab Results  Component Value Date   HGBA1C 5.1 08/31/2023   Lab Results  Component Value Date   WBC 8.4 08/31/2023   HGB 10.6 (L) 08/31/2023   HCT 34.1 (L) 08/31/2023   MCV 89.5 08/31/2023   PLT 175 08/31/2023   Lab  Results  Component Value Date   VITAMINB12 227 08/30/2023   No results found for: FOLATE Lab Results  Component Value Date   NA 140 08/31/2023   K 3.5 08/31/2023   CL 116 (H) 08/31/2023   CO2 18 (L) 08/31/2023     DIAGNOSTIC IMAGING/PROCEDURES   I have reviewed the images obtained:, as below    CT-head: Significant atrophy diffusely out of proportion to age but no acute intracranial abnormalities or loss of gray-white differentiation  ASSESSMENT/PLAN   58 year old female who presents after an unwitnessed fall with retrograde amnesia, personal report of bilateral lower extremity weakness, and wife's report of incoherent speech, the latter of which lasted several hours before resolution.  The patient feels back to baseline aside from continued right lower extremity weakness out of proportion to her usual, which is simple right dorsiflexion weakness. She states that it has somewhat improved during this admission.  - Exam reveals RLE weakness in a central (UMN) pattern.  - Head CT without acute findings.  - Initially had an elevated white blood cell count, but this has resolved.   - Her B12 level is low. - Differential diagnoses include acute ischemic stroke of left MCA and/or ACA versus less likely seizure followed by postictal state, now with residual right lower extremity weakness suggestive.  Also cannot rule out cardiogenic or other causes of syncope with right lower extremity weakness having some functional overlay, although I currently do not note any giveaway weakness suggestive of this.  Recommendations: Possible acute ischemic stroke -Optimally, would want brain MRI, but bladder stimulator from 2006 still in place and is likely not MRI compatible. Additionally, she states that the remote for the device has not been seen by her for years, and may have been lost by this  time.  - As we cannot obtain MRI, will need to repeat head CT 24 hours after initial - CTA head and neck - CT  perfusion should also be ordered as it may detect a stroke tham  may not yet be visible on repeat CT - A1c, lipid profile - Echocardiogram: Even if not from stroke standpoint would assess possible cardiac abnormalities as a cause of alteration in mental status - Telemetry  - Supplement b12, 1000mcg IM x 7 days then orally  - Folate level  - Would also supplement vit D; level 21 earlier this month - EEG in the AM   Cynthia Peter, PA-C [facility] Please use AMION for contact information & EPIC for messaging.  I have seen and examined the patient. I have formulated the assessment and recommendations. 58 year old female with episode of aphasia and acute onset of proximal and distal RLE weakness, followed by resolution of aphasia and gradual improvement but not full resolution of her RLE weakness. Exam reveals RLE weakness and normal speech. CT head unrevealing. Cannot obtain MRI. Recommendations as above.  Electronically signed: Dr. Doyt Castellana

## 2023-08-31 NOTE — Evaluation (Signed)
 Physical Therapy Evaluation Patient Details Name: Cynthia Cohen MRN: [id number] DOB: [birth year] Today's Date: 08/31/2023  History of Present Illness  58 yo  female admitted 08/31/24 after a fall and confusion,  limited memory of incident. CT-Scattered progressive facet hypertrophy, greatest on the right at  C2-3 and T1-2 and on the left at C4-5, negative fractures. CT of brain  negative. EFY:Hjdumpr bypass, bladder stimulator, bowel resection, TKA and revision, ieostomy reversal, thyroid dz, HTN, hypothyroid, anxiety /depression, GERD, NMD-Tourette syndrome, DM, back pain, Cdiff, neuropathy. recommended to transfer to [facility] to R/O stroke.  Clinical Impression  Pt admitted with above diagnosis.  Pt currently with functional limitations due to the deficits listed below (see PT Problem List). Pt will benefit from acute skilled PT to increase their independence and safety with mobility to allow discharge.       The patient reporting neck pain and right LE pain, focussed on lower leg.  Patient  assisted to transfer to [facility] using Rw, then ambulated x ~ 25' using RW. Noted antalgic on the right LE.  Patient is independent at baseline and uses no AD. Patient resides with family including 3 young children. Recommend HHPT at this time.    If plan is discharge home, recommend the following: A little help with walking and/or transfers;A little help with bathing/dressing/bathroom;Assistance with cooking/housework;Assist for transportation;Help with stairs or ramp for entrance   Can travel by private vehicle    yes    Equipment Recommendations None recommended by PT  Recommendations for Other Services       Functional Status Assessment Patient has had a recent decline in their functional status and demonstrates the ability to make significant improvements in function in a reasonable and predictable amount of time.     Precautions / Restrictions Precautions Precautions: Fall Recall of  Precautions/Restrictions: Intact Restrictions Weight Bearing Restrictions Per Provider Order: No      Mobility  Bed Mobility Overal bed mobility: Needs Assistance Bed Mobility: Supine to Sit, Sit to Supine     Supine to sit: Contact guard Sit to supine: Min assist   General bed mobility comments: assist RLE onto bed.    Transfers Overall transfer level: Needs assistance Equipment used: Rolling walker (2 wheels) Transfers: Sit to/from Stand, Bed to chair/wheelchair/BSC Sit to Stand: Min assist   Step pivot transfers: Min assist       General transfer comment: slow to stand from stretcher and BSC, STEp pivot to [facility] and back to stretcher, holding onto RW for support, attempted without  UE support and  subsequently needed a  RW    Ambulation/Gait Ambulation/Gait assistance: Min assist Gait Distance (Feet): 20 Feet Assistive device: Rolling walker (2 wheels) Gait Pattern/deviations: Step-to pattern, Step-through pattern, Antalgic Gait velocity: decr     General Gait Details: ambulated around room, stopped at sink  to brush teeth  Stairs            Wheelchair Mobility     Tilt Bed    Modified Rankin (Stroke Patients Only)       Balance Overall balance assessment: Needs assistance Sitting-balance support: No upper extremity supported, Feet supported Sitting balance-Leahy Scale: Good     Standing balance support: During functional activity, No upper extremity supported Standing balance-Leahy Scale: Fair                               Pertinent Vitals/Pain Pain Assessment Pain Assessment: Faces Faces Pain Scale: Hurts  even more Pain Location: right lower leg , neck Pain Descriptors / Indicators: Aching, Discomfort, Tightness, Tender Pain Intervention(s): Monitored during session, Limited activity within patient's tolerance    Home Living Family/patient expects to be discharged to:: Private residence Living Arrangements:  Spouse/significant other;Children Available Help at Discharge: Family;Friend(s);Available 24 hours/day Type of Home: House Home Access: Stairs to enter   [employer] of Steps: small   Home Layout: One level Home Equipment: Agricultural consultant (2 wheels)      Prior Function Prior Level of Function : Independent/Modified Independent                     Extremity/Trunk Assessment   Upper Extremity Assessment Upper Extremity Assessment: Overall WFL for tasks assessed    Lower Extremity Assessment Lower Extremity Assessment: RLE deficits/detail RLE Deficits / Details: antalgic  with WB, required support to lift right leg onto stretcher., decreased full  knee flexion with pain onset    Cervical / Trunk Assessment Cervical / Trunk Assessment: Other exceptions Cervical / Trunk Exceptions: head forward posture, guarding with AROM  Communication   Communication Communication: No apparent difficulties    Cognition Arousal: Alert Behavior During Therapy: WFL for tasks assessed/performed   PT - Cognitive impairments: No apparent impairments                       PT - Cognition Comments: off one day of week, Following commands: Intact       Cueing       General Comments      Exercises     Assessment/Plan    PT Assessment Patient needs continued PT services  PT Problem List Decreased strength;Decreased cognition;Decreased activity tolerance;Decreased balance;Decreased mobility;Decreased knowledge of precautions;Pain       PT Treatment Interventions DME instruction;Therapeutic exercise;Gait training;Functional mobility training;Therapeutic activities;Patient/family education;Balance training    PT Goals (Current goals can be found in the Care Plan section)  Acute Rehab PT Goals Patient Stated Goal: go home PT Goal Formulation: With patient Time For Goal Achievement: 09/14/23 Potential to Achieve Goals: Good    Frequency Min 3X/week      Co-evaluation               AM-PAC PT 6 Clicks Mobility  Outcome Measure Help needed turning from your back to your side while in a flat bed without using bedrails?: None Help needed moving from lying on your back to sitting on the side of a flat bed without using bedrails?: A Little Help needed moving to and from a bed to a chair (including a wheelchair)?: A Little Help needed standing up from a chair using your arms (e.g., wheelchair or bedside chair)?: A Little Help needed to walk in hospital room?: A Little Help needed climbing 3-5 steps with a railing? : A Lot 6 Click Score: 18    End of Session   Activity Tolerance: Patient limited by fatigue Patient left: in bed;with call bell/phone within reach;with nursing/sitter in room Nurse Communication: Mobility status PT Visit Diagnosis: Unsteadiness on feet (R26.81);Other abnormalities of gait and mobility (R26.89);History of falling (Z91.81);Other symptoms and signs involving the nervous system (R29.898)    Time: 9199-9171 PT Time Calculation (min) (ACUTE ONLY): 28 min   Charges:   PT Evaluation $PT Eval Low Complexity: 1 Low PT Treatments $Gait Training: 8-22 mins PT General Charges $$ ACUTE PT VISIT: 1 Visit         Darice Potters PT Acute Rehabilitation Services Office  [phone]   Leigh Darice Norris 08/31/2023, 9:09 AM

## 2023-08-31 NOTE — Care Management Obs Status (Signed)
 MEDICARE OBSERVATION STATUS NOTIFICATION   Patient Details  Name: Cynthia Cohen MRN: [id number] Date of Birth: 1965-02-27   Medicare Observation Status Notification Given:  Yes  Patient has a contact order in place I called patient on the phone to go over the obs letter copy given   [name] 08/31/2023, 4:17 PM

## 2023-09-01 ENCOUNTER — Observation Stay (HOSPITAL_COMMUNITY)

## 2023-09-01 ENCOUNTER — Inpatient Hospital Stay (HOSPITAL_COMMUNITY): Admit: 2023-09-01

## 2023-09-01 DIAGNOSIS — R569 Unspecified convulsions: Secondary | ICD-10-CM | POA: Diagnosis not present

## 2023-09-01 DIAGNOSIS — R748 Abnormal levels of other serum enzymes: Secondary | ICD-10-CM | POA: Diagnosis not present

## 2023-09-01 DIAGNOSIS — R55 Syncope and collapse: Secondary | ICD-10-CM | POA: Diagnosis not present

## 2023-09-01 DIAGNOSIS — Z79899 Other long term (current) drug therapy: Secondary | ICD-10-CM

## 2023-09-01 DIAGNOSIS — W19XXXA Unspecified fall, initial encounter: Secondary | ICD-10-CM | POA: Diagnosis not present

## 2023-09-01 DIAGNOSIS — R531 Weakness: Secondary | ICD-10-CM | POA: Diagnosis not present

## 2023-09-01 DIAGNOSIS — F4310 Post-traumatic stress disorder, unspecified: Secondary | ICD-10-CM

## 2023-09-01 DIAGNOSIS — R001 Bradycardia, unspecified: Secondary | ICD-10-CM

## 2023-09-01 LAB — CBC
HCT: 32.6 % — ABNORMAL LOW (ref 36.0–46.0)
Hemoglobin: 10.3 g/dL — ABNORMAL LOW (ref 12.0–15.0)
MCH: 27.9 pg (ref 26.0–34.0)
MCHC: 31.6 g/dL (ref 30.0–36.0)
MCV: 88.3 fL (ref 80.0–100.0)
Platelets: 171 K/uL (ref 150–400)
RBC: 3.69 MIL/uL — ABNORMAL LOW (ref 3.87–5.11)
RDW: 13.2 % (ref 11.5–15.5)
WBC: 5.5 K/uL (ref 4.0–10.5)
nRBC: 0 % (ref 0.0–0.2)

## 2023-09-01 LAB — GLUCOSE, CAPILLARY
Glucose-Capillary: 75 mg/dL (ref 70–99)
Glucose-Capillary: 120 mg/dL — ABNORMAL HIGH (ref 70–99)
Glucose-Capillary: 97 mg/dL (ref 70–99)

## 2023-09-01 LAB — BASIC METABOLIC PANEL WITH GFR
Anion gap: 9 (ref 5–15)
BUN: 6 mg/dL (ref 6–20)
CO2: 19 mmol/L — ABNORMAL LOW (ref 22–32)
Calcium: 8.5 mg/dL — ABNORMAL LOW (ref 8.9–10.3)
Chloride: 113 mmol/L — ABNORMAL HIGH (ref 98–111)
Creatinine, Ser: 0.63 mg/dL (ref 0.44–1.00)
GFR, Estimated: >60 mL/min (ref >60)
Glucose, Bld: 126 mg/dL — ABNORMAL HIGH (ref 70–99)
Potassium: 3.5 mmol/L (ref 3.5–5.1)
Sodium: 141 mmol/L (ref 135–145)

## 2023-09-01 LAB — RESP PANEL BY RT-PCR (RSV, FLU A&B, COVID)  RVPGX2
Influenza A by PCR: NEGATIVE
Influenza B by PCR: NEGATIVE
Resp Syncytial Virus by PCR: NEGATIVE
SARS Coronavirus 2 by RT PCR: NEGATIVE

## 2023-09-01 LAB — RAPID URINE DRUG SCREEN, HOSP PERFORMED
Amphetamines: NOT DETECTED
Barbiturates: NOT DETECTED
Benzodiazepines: POSITIVE — AB
Cocaine: NOT DETECTED
Opiates: NOT DETECTED
Tetrahydrocannabinol: NOT DETECTED

## 2023-09-01 LAB — ECHOCARDIOGRAM COMPLETE BUBBLE STUDY
Area-P 1/2: 3.7 cm2
S' Lateral: 3.1 cm

## 2023-09-01 NOTE — TOC Transition Note (Signed)
 Transition of Care [facility]) - Discharge Note   Patient Details  Name: Cynthia Cohen MRN: [id number] Date of Birth: 06-08-1965  Transition of Care [employer]) CM/SW Contact:  Andrez JULIANNA George, RN Phone Number: 09/01/2023, 2:48 PM   Clinical Narrative:     PCP: Dr Alfonso Sheffield Pt discharging home with home health through [place]. Information on the AVS. Pt was provided choice under [email or web address]. Hedda will contact her for the first home visit. Walker ordered through [employer] and will be delivered to the room. Pt drives but spouse can provide needed transportation after d/c.  Pt manages her own medications and denies any issues.  Pt has transportation home.  Final next level of care: Home w Home Health Services Barriers to Discharge: No Barriers Identified   Patient Goals and CMS Choice   CMS Medicare.gov Compare Post Acute Care list provided to:: Patient Choice offered to / list presented to : Patient      Discharge Placement                       Discharge Plan and Services Additional resources added to the After Visit Summary for                  DME Arranged: Walker rolling DME Agency: [employer] Date DME Agency Contacted: 09/01/23     HH Arranged: PT, OT, RN HH Agency: [facility] Date [facility] Agency Contacted: 09/01/23   Representative spoke with at [facility] Agency: Darleene  Social Drivers of Health (SDOH) Interventions SDOH Screenings   Food Insecurity: No Food Insecurity (08/31/2023)  Housing: Unknown (08/31/2023)  Transportation Needs: No Transportation Needs (08/31/2023)  Utilities: Not At Risk (08/31/2023)  Depression (PHQ2-9): High Risk (07/04/2023)  Social Connections: Unknown (06/18/2021)   Received from [facility]  Stress: Stress Concern Present (06/24/2023)   Received from [facility]  Tobacco Use: Medium Risk (08/31/2023)     Readmission Risk Interventions     No data to display

## 2023-09-01 NOTE — Evaluation (Signed)
 Occupational Therapy Evaluation Patient Details Name: Cynthia Cohen MRN: [id number] DOB: [birth year] Today's Date: 09/01/2023   History of Present Illness   Pt is a 58 y/o female presenting on 7/23 after fall and confusion. CT head negative for acute progress but significant atrophy diffusely out of proportion to age. CT spine negative for acute changes. Repeat CT pending. EEG negative. PMH includes: anxiety and depression, PTSD, ADHD, GERD, tic disorder, foot drop, HTN, osteoarthritis, chronic back pain, bradycardia, T2DM and asthma, bladder stimulator.     Clinical Impressions PTA patient independent and driving. Admitted for above and presents with problem list below.  Completing mobility using RW with contact guard assist, ADLs with up to contact guard assist.  Moves slow and guarded, relies on grab bar in bathroom to transfer from regular commode.  She has good support at home from spouse. Based on performance today, pt will best benefit from continued OT services acutely and after dc at [facility] level to optimize independence and safety with ADLs, IADLs and mobility.     If plan is discharge home, recommend the following:   A little help with walking and/or transfers;A little help with bathing/dressing/bathroom;Assistance with cooking/housework;Assist for transportation;Help with stairs or ramp for entrance     Functional Status Assessment   Patient has had a recent decline in their functional status and demonstrates the ability to make significant improvements in function in a reasonable and predictable amount of time.     Equipment Recommendations   BSC/3in1     Recommendations for Other Services         Precautions/Restrictions   Precautions Precautions: Fall Recall of Precautions/Restrictions: Intact Restrictions Weight Bearing Restrictions Per Provider Order: No     Mobility Bed Mobility Overal bed mobility: Needs Assistance Bed Mobility: Supine to Sit, Sit  to Supine     Supine to sit: Supervision Sit to supine: Supervision        Transfers Overall transfer level: Needs assistance Equipment used: Rolling walker (2 wheels) Transfers: Sit to/from Stand Sit to Stand: Contact guard assist           General transfer comment: slow to rise but no physical assistance or unsteadiness, does rely on grabbar from low commode      Balance Overall balance assessment: Needs assistance Sitting-balance support: No upper extremity supported, Feet supported Sitting balance-Leahy Scale: Good     Standing balance support: During functional activity, No upper extremity supported Standing balance-Leahy Scale: Fair                             ADL either performed or assessed with clinical judgement   ADL Overall ADL's : Needs assistance/impaired     Grooming: Contact guard assist;Standing           Upper Body Dressing : Set up;Sitting   Lower Body Dressing: Contact guard assist;Sit to/from stand   Toilet Transfer: Contact guard assist;Ambulation;Rolling walker (2 wheels)   Toileting- Clothing Manipulation and Hygiene: Contact guard assist;Sit to/from stand       Functional mobility during ADLs: Contact guard assist;Rolling walker (2 wheels)       Vision Baseline Vision/History: 1 Wears glasses Ability to See in Adequate Light: 0 Adequate Patient Visual Report: No change from baseline Vision Assessment?: No apparent visual deficits     Perception         Praxis         Pertinent Vitals/Pain Pain Assessment Pain Assessment: Faces  Faces Pain Scale: Hurts little more Pain Location: neck Pain Descriptors / Indicators: Sore Pain Intervention(s): Monitored during session     Extremity/Trunk Assessment Upper Extremity Assessment Upper Extremity Assessment: Right hand dominant;Generalized weakness   Lower Extremity Assessment Lower Extremity Assessment: Defer to PT evaluation   Cervical / Trunk  Assessment Cervical / Trunk Assessment: Other exceptions Cervical / Trunk Exceptions: head forward posture   Communication Communication Communication: No apparent difficulties   Cognition Arousal: Alert Behavior During Therapy: WFL for tasks assessed/performed Cognition: No apparent impairments             OT - Cognition Comments: slow processing but likely baseline                 Following commands: Intact       Cueing  General Comments   Cueing Techniques: Verbal cues      Exercises     Shoulder Instructions      Home Living Family/patient expects to be discharged to:: Private residence Living Arrangements: Spouse/significant other;Children (spouse works from home; 3 kids) Available Help at Discharge: Family;Friend(s);Available 24 hours/day Type of Home: Apartment Home Access: Stairs to enter Entrance Stairs-Number of Steps: threshold   Home Layout: One level     Bathroom Shower/Tub: Chief Strategy Officer: Standard Bathroom Accessibility: Yes How Accessible: Accessible via walker Home Equipment: Rolling Walker (2 wheels);Tub bench;Rollator (4 wheels)          Prior Functioning/Environment Prior Level of Function : Independent/Modified Independent;Driving                    OT Problem List: Decreased activity tolerance;Impaired balance (sitting and/or standing);Pain;Decreased knowledge of use of DME or AE   OT Treatment/Interventions: Self-care/ADL training;Energy conservation;DME and/or AE instruction;Balance training;Patient/family education;Therapeutic activities      OT Goals(Current goals can be found in the care plan section)   Acute Rehab OT Goals Patient Stated Goal: home OT Goal Formulation: With patient Time For Goal Achievement: 09/15/23 Potential to Achieve Goals: Good   OT Frequency:  Min 2X/week    Co-evaluation              AM-PAC OT 6 Clicks Daily Activity     Outcome Measure Help from  another person eating meals?: None Help from another person taking care of personal grooming?: A Little Help from another person toileting, which includes using toliet, bedpan, or urinal?: A Little Help from another person bathing (including washing, rinsing, drying)?: A Little Help from another person to put on and taking off regular upper body clothing?: A Little Help from another person to put on and taking off regular lower body clothing?: A Little 6 Click Score: 19   End of Session Equipment Utilized During Treatment: Gait belt;Rolling walker (2 wheels) Nurse Communication: Mobility status;Precautions  Activity Tolerance: Patient tolerated treatment well Patient left: in bed;with call bell/phone within reach;with bed alarm set  OT Visit Diagnosis: Other abnormalities of gait and mobility (R26.89);Muscle weakness (generalized) (M62.81)                Time: 8775-8746 OT Time Calculation (min): 29 min Charges:  OT General Charges $OT Visit: 1 Visit OT Evaluation $OT Eval Moderate Complexity: 1 Mod OT Treatments $Self Care/Home Management : 8-22 mins  Etta NOVAK, OT Acute Rehabilitation Services Office [phone] Secure Chat Preferred    Etta GORMAN Hope 09/01/2023, 1:37 PM

## 2023-09-01 NOTE — Procedures (Signed)
 Patient Name: Cynthia Cohen  MRN: [id number]  Epilepsy Attending: Arlin MALVA Krebs  Referring Physician/Provider: Merrianne Locus, MD  Date: 09/01/2023 Duration: 22.21 mins  Patient history: 58 year old female who presents after an unwitnessed fall with retrograde amnesia, personal report of bilateral lower extremity weakness, and wife's report of incoherent speech, the latter of which lasted several hours before resolution. EEG to evaluate for seizure  Level of alertness: Awake  AEDs during EEG study: LTG, Clonazepam , GBP, TPM  Technical aspects: This EEG study was done with scalp electrodes positioned according to the 10-20 International system of electrode placement. Electrical activity was reviewed with band pass filter of 1-70Hz , sensitivity of 7 uV/mm, display speed of 87mm/sec with a 60Hz  notched filter applied as appropriate. EEG data were recorded continuously and digitally stored.  Video monitoring was available and reviewed as appropriate.  Description: The posterior dominant rhythm consists of 9-10 Hz activity of moderate voltage (25-35 uV) seen predominantly in posterior head regions, symmetric and reactive to eye opening and eye closing. Hyperventilation and photic stimulation were not performed.     IMPRESSION: This study is within normal limits. No seizures or epileptiform discharges were seen throughout the recording.  A normal interictal EEG does not exclude the diagnosis of epilepsy.   Dquan Cortopassi O Jamez Ambrocio

## 2023-09-01 NOTE — Progress Notes (Addendum)
 Progress Note   Patient: Cynthia Cohen DOB: [birth year] DOA: 08/30/2023     0 DOS: the patient was seen and examined on 09/01/2023   Brief hospital course: 58 y.o. female with medical history significant for  anxiety and depression, PTSD, ADHD, GERD, foot drop, tic disorder, hypothyroidism, HTN, osteoarthritis, chronic back pain, bradycardia, T2DM and asthma who presented to the ED for evaluation after fall.   He could not remember how she fell down in bathroom. Has baseline left foot drop, she is on multiple pain medications, muscle relaxants, depression medications, methylphenidate .  Patient is admitted to [facility] service at [facility].  She had a CT head which was negative neurology consulted advised to transfer to [facility].  She could not get MRI of brain due to bladder stimulator.  Neurology evaluated the patient, stroke, seizures ruled out.  Plan to discharge her but due to unavailability of ride, rolling walker at bedside, DC held.  Assessment and Plan: Syncope and collapse Status post fall Stroke ruled out.  Repeat CT head unremarkable. Neurology evaluation appreciated.  No further workup recommended. Orthostatic vitals negative.  Patient does have sinus bradycardia, no reported dizziness or lightheadedness.  Prior records reviewed showed persistent bradycardia.   Her presentation could be due to narcolepsy, polypharmacy versus vasovagal. Seizures ruled out, EEG unremarkable. PT OT evaluated the patient recommended home health, rolling walker. Plan to discharge her today as she is medically stable, but due to unavailability of her ride and rolling walker, I canceled the discharge.  Status post fall: CK level trending down. Leukocytosis likely due to stress -normalized. Drop in hemoglobin likely due to hemodilution.  Anxiety depression Tic disorder PTSD ADHD- Continue patient's home medication including buspirone, progesterone, lamotrigine , clonazepam , well as  ordered. She takes methylphenidate  which is resumed.  Chronic back pain- Advised to limit muscle relaxants, gabapentin .  GERD continue Nexium .        Out of bed to chair. Incentive spirometry. Nursing supportive care. Fall, aspiration precautions. Diet:  Diet Orders (From admission, onward)     Start     Ordered   08/31/23 0031  Diet regular Room service appropriate? Yes; Fluid consistency: Thin  Diet effective now       Question Answer Comment  Room service appropriate? Yes   Fluid consistency: Thin      08/31/23 0030           DVT prophylaxis: enoxaparin  (LOVENOX ) injection 40 mg Start: 08/31/23 1000  Level of care: Telemetry Medical   Code Status: Full Code  Subjective: Patient is seen and examined today morning.  I did see her walk with PT.  Orthostatics negative.  States that she does not have right and did not get her rolling walker to bedside.  Discharge held  Physical Exam: Vitals:   09/01/23 1419 09/01/23 1421 09/01/23 1423 09/01/23 1558  BP: 120/61 114/71 121/68 123/75  Pulse: 77 73 80 (!) 58  Resp: 16   18  Temp: 98.6 F (37 C)   99.3 F (37.4 C)  TempSrc: Oral   Oral  SpO2: 99% 100%  98%  Weight:      Height:        General - Middle aged Caucasian female, no apparent distress HEENT - PERRLA, EOMI, atraumatic head, involuntary head movements Lung - Clear, no rales, rhonchi, wheezes. Heart - S1, S2 heard, no murmurs, rubs, no pedal edema. Abdomen - Soft, non tender, bowel sounds good Neuro - Alert, awake and oriented x 3, left foot  weakness Skin - Warm and dry.   Data Reviewed:      Latest Ref Rng & Units 09/01/2023    3:42 AM 08/31/2023    9:00 AM 08/30/2023    3:17 PM  CBC  WBC 4.0 - 10.5 K/uL 5.5  8.4  14.9   Hemoglobin 12.0 - 15.0 g/dL 89.6  89.3  88.7   Hematocrit 36.0 - 46.0 % 32.6  34.1  36.1   Platelets 150 - 400 K/uL 171  175  198       Latest Ref Rng & Units 09/01/2023    3:42 AM 08/31/2023    9:00 AM 08/30/2023    3:17 PM   BMP  Glucose 70 - 99 mg/dL 873  94  875   BUN 6 - 20 mg/dL 6  9  13    Creatinine 0.44 - 1.00 mg/dL 9.36  9.49  9.30   Sodium 135 - 145 mmol/L 141  140  144   Potassium 3.5 - 5.1 mmol/L 3.5  3.5  3.4   Chloride 98 - 111 mmol/L 113  116  115   CO2 22 - 32 mmol/L 19  18  23    Calcium 8.9 - 10.3 mg/dL 8.5  8.2  8.7    CT HEAD WO CONTRAST ( ) Result Date: 09/01/2023 CLINICAL DATA:  Stroke, follow up EXAM: CT HEAD WITHOUT CONTRAST TECHNIQUE: Contiguous axial images were obtained from the base of the skull through the vertex without intravenous contrast. RADIATION DOSE REDUCTION: This exam was performed according to the departmental dose-optimization program which includes automated exposure control, adjustment of the mA and/or kV according to patient size and/or use of iterative reconstruction technique. COMPARISON:  CT the head dated August 30, 2023. FINDINGS: Brain: Mild cerebral volume loss. The brain appears normal. There is no evidence of hemorrhage, mass, cortical infarct or hydrocephalus. Vascular: Mild vascular calcifications. Skull: Intact and unremarkable. Sinuses/Orbits: Clear paranasal sinuses.  Normal orbits. Other: None. IMPRESSION: Normal. Electronically Signed   By: Evalene Coho M.D.   On: 09/01/2023 16:04   ECHOCARDIOGRAM COMPLETE BUBBLE STUDY Result Date: 09/01/2023    ECHOCARDIOGRAM REPORT   Patient Name:   Cynthia Cohen Date of Exam: 09/01/2023 Medical Rec #:  [id number]       Height:       63.0 in Accession #:    [id number]      Weight:       125.0 lb Date of Birth:  11/10/1965      BSA:          1.584 m Patient Age:    57 years        BP:           101/62 mmHg Patient Gender: F               HR:           68 bpm. Exam Location:  Inpatient Procedure: 2D Echo, Color Doppler, Cardiac Doppler and Saline Contrast Bubble            Study (Both Spectral and Color Flow Doppler were utilized during            procedure). Indications:    Stroke 434.91 / I63.9  History:        Patient has  no prior history of Echocardiogram examinations.                 Risk Factors:Diabetes and Hypertension.  Sonographer:    Tinnie Gosling RDCS Referring Phys:  KRISTI KEHOE IMPRESSIONS  1. Left ventricular ejection fraction, by estimation, is 60 to 65%. The left ventricle has normal function. The left ventricle has no regional wall motion abnormalities. Left ventricular diastolic parameters were normal.  2. Right ventricular systolic function is normal. The right ventricular size is normal.  3. The mitral valve is normal in structure. No evidence of mitral valve regurgitation. No evidence of mitral stenosis.  4. The aortic valve is normal in structure. Aortic valve regurgitation is not visualized. No aortic stenosis is present.  5. The inferior vena cava is normal in size with greater than 50% respiratory variability, suggesting right atrial pressure of 3 mmHg.  6. Agitated saline contrast bubble study was negative, with no evidence of any interatrial shunt. FINDINGS  Left Ventricle: Left ventricular ejection fraction, by estimation, is 60 to 65%. The left ventricle has normal function. The left ventricle has no regional wall motion abnormalities. The left ventricular internal cavity size was normal in size. There is  no left ventricular hypertrophy. Left ventricular diastolic parameters were normal. Right Ventricle: The right ventricular size is normal. No increase in right ventricular wall thickness. Right ventricular systolic function is normal. Left Atrium: Left atrial size was normal in size. Right Atrium: Right atrial size was normal in size. Pericardium: There is no evidence of pericardial effusion. Mitral Valve: The mitral valve is normal in structure. No evidence of mitral valve regurgitation. No evidence of mitral valve stenosis. Tricuspid Valve: The tricuspid valve is normal in structure. Tricuspid valve regurgitation is not demonstrated. No evidence of tricuspid stenosis. Aortic Valve: The aortic valve is  normal in structure. Aortic valve regurgitation is not visualized. No aortic stenosis is present. Pulmonic Valve: The pulmonic valve was normal in structure. Pulmonic valve regurgitation is not visualized. No evidence of pulmonic stenosis. Aorta: The aortic root is normal in size and structure. Venous: The inferior vena cava is normal in size with greater than 50% respiratory variability, suggesting right atrial pressure of 3 mmHg. IAS/Shunts: No atrial level shunt detected by color flow Doppler. Agitated saline contrast was given intravenously to evaluate for intracardiac shunting. Agitated saline contrast bubble study was negative, with no evidence of any interatrial shunt.  LEFT VENTRICLE PLAX 2D LVIDd:         4.50 cm   Diastology LVIDs:         3.10 cm   LV e' medial:    7.18 cm/s LV PW:         1.00 cm   LV E/e' medial:  10.4 LV IVS:        1.00 cm   LV e' lateral:   8.27 cm/s LVOT diam:     1.80 cm   LV E/e' lateral: 9.1 LV SV:         56 LV SV Index:   36 LVOT Area:     2.54 cm  RIGHT VENTRICLE RV S prime:     24.10 cm/s TAPSE (M-mode): 1.8 cm LEFT ATRIUM             Index        RIGHT ATRIUM           Index LA diam:        3.10 cm 1.96 cm/m   RA Area:     15.70 cm LA Vol (A2C):   56.3 ml 35.55 ml/m  RA Volume:   40.60 ml  25.64 ml/m LA Vol (A4C):   39.9 ml 25.20 ml/m LA Biplane Vol:  51.1 ml 32.27 ml/m  AORTIC VALVE LVOT Vmax:   112.00 cm/s LVOT Vmean:  78.100 cm/s LVOT VTI:    0.221 m  AORTA Ao Root diam: 2.80 cm Ao Asc diam:  3.30 cm MITRAL VALVE MV Area (PHT): 3.70 cm    SHUNTS MV Decel Time: 205 msec    Systemic VTI:  0.22 m MV E velocity: 74.90 cm/s  Systemic Diam: 1.80 cm MV A velocity: 79.70 cm/s MV E/A ratio:  0.94 Morene Brownie Electronically signed by Morene Brownie Signature Date/Time: 09/01/2023/2:37:11 PM    Final    EEG adult Result Date: 09/01/2023 Shelton Arlin KIDD, MD     09/01/2023 11:03 AM Patient Name: Cynthia Cohen MRN: [id number] Epilepsy Attending: Arlin KIDD Shelton  Referring Physician/Provider: Merrianne Locus, MD Date: 09/01/2023 Duration: 22.21 mins Patient history: 58 year old female who presents after an unwitnessed fall with retrograde amnesia, personal report of bilateral lower extremity weakness, and wife's report of incoherent speech, the latter of which lasted several hours before resolution. EEG to evaluate for seizure Level of alertness: Awake AEDs during EEG study: LTG, Clonazepam , GBP, TPM Technical aspects: This EEG study was done with scalp electrodes positioned according to the 10-20 International system of electrode placement. Electrical activity was reviewed with band pass filter of 1-70Hz , sensitivity of 7 uV/mm, display speed of 97mm/sec with a 60Hz  notched filter applied as appropriate. EEG data were recorded continuously and digitally stored.  Video monitoring was available and reviewed as appropriate. Description: The posterior dominant rhythm consists of 9-10 Hz activity of moderate voltage (25-35 uV) seen predominantly in posterior head regions, symmetric and reactive to eye opening and eye closing. Hyperventilation and photic stimulation were not performed.   IMPRESSION: This study is within normal limits. No seizures or epileptiform discharges were seen throughout the recording. A normal interictal EEG does not exclude the diagnosis of epilepsy. Priyanka KIDD Shelton   CT ANGIO HEAD NECK W WO CM Result Date: 08/31/2023 CLINICAL DATA:  Presented to ED after fall, significant lower extremity weakness, lethargy and disorientation. Right lower extremity weakness and altered mental status. EXAM: CT ANGIOGRAPHY HEAD AND NECK CT PERFUSION BRAIN TECHNIQUE: Multidetector CT imaging of the head and neck was performed using the standard protocol during bolus administration of intravenous contrast. Multiplanar CT image reconstructions and MIPs were obtained to evaluate the vascular anatomy. Carotid stenosis measurements (when applicable) are obtained utilizing NASCET  criteria, using the distal internal carotid diameter as the denominator. Multiphase CT imaging of the brain was performed following IV bolus contrast injection. Subsequent parametric perfusion maps were calculated using RAPID software. RADIATION DOSE REDUCTION: This exam was performed according to the departmental dose-optimization program which includes automated exposure control, adjustment of the mA and/or kV according to patient size and/or use of iterative reconstruction technique. CONTRAST:  OMNIPAQUE  IOHEXOL  350 MG/ML SOLN COMPARISON:  CT head 08/30/2023. FINDINGS: CTA NECK FINDINGS Aortic arch: Four vessel configuration of the aortic arch with left vertebral artery originating on the arch. Imaged portion shows no evidence of aneurysm or dissection. No significant stenosis of the major arch vessel origins. Pulmonary arteries: As permitted by contrast timing, there are no filling defects in the visualized pulmonary arteries. Subclavian arteries: The subclavian arteries are patent bilaterally. Right carotid system: No evidence of dissection, stenosis (50% or greater), or occlusion. Left carotid system: No evidence of dissection, stenosis (50% or greater), or occlusion. Vertebral arteries: Codominant. No evidence of dissection, stenosis (50% or greater), or occlusion. Skeleton: No acute or aggressive  finding noted. Other neck: The visualized airway is patent. No cervical lymphadenopathy. Upper chest: Visualized lung apices are clear. Review of the MIP images confirms the above findings CTA HEAD FINDINGS ANTERIOR CIRCULATION: The intracranial ICAs are patent bilaterally. Minimal focal atherosclerosis of the left cavernous ICA. No significant stenosis, proximal occlusion, aneurysm, or vascular malformation. MCAs: The middle cerebral arteries are patent bilaterally. ACAs: The anterior cerebral arteries are patent bilaterally. POSTERIOR CIRCULATION: No significant stenosis, proximal occlusion, aneurysm, or  vascular malformation. PCAs: The posterior cerebral arteries are patent bilaterally. Pcomm: Visualized on the right. SCAs: The superior cerebellar arteries are patent bilaterally. Basilar artery: Patent AICAs: Patent PICAs: Patent Vertebral arteries: The intracranial vertebral arteries are patent. Venous sinuses: As permitted by contrast timing, patent. Anatomic variants: None Review of the MIP images confirms the above findings CT Brain Perfusion Findings: CBF (<30%) Volume: 0mL Perfusion (Tmax>6.0s) volume: 0mL Mismatch Volume: 0mL Infarction Location:None identified IMPRESSION: No large vessel occlusion. No core infarct identified on CT perfusion. Electronically Signed   By: Donnice Mania M.D.   On: 08/31/2023 14:48   CT CEREBRAL PERFUSION W CONTRAST Result Date: 08/31/2023 CLINICAL DATA:  Presented to ED after fall, significant lower extremity weakness, lethargy and disorientation. Right lower extremity weakness and altered mental status. EXAM: CT ANGIOGRAPHY HEAD AND NECK CT PERFUSION BRAIN TECHNIQUE: Multidetector CT imaging of the head and neck was performed using the standard protocol during bolus administration of intravenous contrast. Multiplanar CT image reconstructions and MIPs were obtained to evaluate the vascular anatomy. Carotid stenosis measurements (when applicable) are obtained utilizing NASCET criteria, using the distal internal carotid diameter as the denominator. Multiphase CT imaging of the brain was performed following IV bolus contrast injection. Subsequent parametric perfusion maps were calculated using RAPID software. RADIATION DOSE REDUCTION: This exam was performed according to the departmental dose-optimization program which includes automated exposure control, adjustment of the mA and/or kV according to patient size and/or use of iterative reconstruction technique. CONTRAST:  OMNIPAQUE  IOHEXOL  350 MG/ML SOLN COMPARISON:  CT head 08/30/2023. FINDINGS: CTA NECK FINDINGS Aortic  arch: Four vessel configuration of the aortic arch with left vertebral artery originating on the arch. Imaged portion shows no evidence of aneurysm or dissection. No significant stenosis of the major arch vessel origins. Pulmonary arteries: As permitted by contrast timing, there are no filling defects in the visualized pulmonary arteries. Subclavian arteries: The subclavian arteries are patent bilaterally. Right carotid system: No evidence of dissection, stenosis (50% or greater), or occlusion. Left carotid system: No evidence of dissection, stenosis (50% or greater), or occlusion. Vertebral arteries: Codominant. No evidence of dissection, stenosis (50% or greater), or occlusion. Skeleton: No acute or aggressive finding noted. Other neck: The visualized airway is patent. No cervical lymphadenopathy. Upper chest: Visualized lung apices are clear. Review of the MIP images confirms the above findings CTA HEAD FINDINGS ANTERIOR CIRCULATION: The intracranial ICAs are patent bilaterally. Minimal focal atherosclerosis of the left cavernous ICA. No significant stenosis, proximal occlusion, aneurysm, or vascular malformation. MCAs: The middle cerebral arteries are patent bilaterally. ACAs: The anterior cerebral arteries are patent bilaterally. POSTERIOR CIRCULATION: No significant stenosis, proximal occlusion, aneurysm, or vascular malformation. PCAs: The posterior cerebral arteries are patent bilaterally. Pcomm: Visualized on the right. SCAs: The superior cerebellar arteries are patent bilaterally. Basilar artery: Patent AICAs: Patent PICAs: Patent Vertebral arteries: The intracranial vertebral arteries are patent. Venous sinuses: As permitted by contrast timing, patent. Anatomic variants: None Review of the MIP images confirms the above findings CT Brain Perfusion Findings:  CBF (<30%) Volume: 0mL Perfusion (Tmax>6.0s) volume: 0mL Mismatch Volume: 0mL Infarction Location:None identified IMPRESSION: No large vessel  occlusion. No core infarct identified on CT perfusion. Electronically Signed   By: Donnice Mania M.D.   On: 08/31/2023 14:48    Family Communication: Discussed with patient, she understand and agree. All questions answered.  Disposition: Status is: Observation The patient remains OBS appropriate and will d/c before 2 midnights.  Planned Discharge Destination: Home with Home Health     Time spent: 43 minutes  Author: Concepcion Riser, MD 09/01/2023 6:56 PM Secure chat 7am to 7pm For on call review [email or web address].

## 2023-09-01 NOTE — Progress Notes (Addendum)
 STROKE TEAM PROGRESS NOTE   INTERIM HISTORY/SUBJECTIVE No family at bedside.  Patient stated that she was at her baseline going to bathroom, the next thing she remembers she was on the floor in the bathroom, cannot get up, her wife at outside bathroom banging on door.  She failed both lower extremity weakness and her wife states that she was confused and incoherent on the speech.  Currently she feels she has largely improved near baseline.  Not able to have MRI due to bladder stimulator.  She denies any history of seizure or any prodromal symptoms before the event.  Family did not report any seizure-like activity.   OBJECTIVE  CBC    Component Value Date/Time   WBC 5.5 09/01/2023 0342   RBC 3.69 (L) 09/01/2023 0342   HGB 10.3 (L) 09/01/2023 0342   HGB 13.7 02/25/2019 1609   HCT 32.6 (L) 09/01/2023 0342   HCT 40.9 02/25/2019 1609   PLT 171 09/01/2023 0342   PLT 245 02/25/2019 1609   MCV 88.3 09/01/2023 0342   MCV 86 02/25/2019 1609   MCH 27.9 09/01/2023 0342   MCHC 31.6 09/01/2023 0342   RDW 13.2 09/01/2023 0342   RDW 12.2 02/25/2019 1609   LYMPHSABS 1.9 08/30/2021 1159   LYMPHSABS 1.9 02/25/2019 1609   MONOABS 0.3 08/30/2021 1159   EOSABS 0.4 08/30/2021 1159   EOSABS 0.5 (H) 02/25/2019 1609   BASOSABS 0.1 08/30/2021 1159   BASOSABS 0.1 02/25/2019 1609    BMET    Component Value Date/Time   NA 141 09/01/2023 0342   NA 145 (H) 02/25/2019 1609   K 3.5 09/01/2023 0342   CL 113 (H) 09/01/2023 0342   CO2 19 (L) 09/01/2023 0342   GLUCOSE 126 (H) 09/01/2023 0342   BUN 6 09/01/2023 0342   BUN 7 02/25/2019 1609   CREATININE 0.63 09/01/2023 0342   CALCIUM 8.5 (L) 09/01/2023 0342   GFRNONAA >60 09/01/2023 0342    IMAGING past 24 hours EEG adult Result Date: 09/01/2023 Cynthia Arlin KIDD, MD     09/01/2023 11:03 AM Patient Name: Cynthia Cohen MRN: [id number] Epilepsy Attending: Arlin Cohen Cynthia Referring Physician/Provider: Merrianne Locus, MD Date: 09/01/2023 Duration: 22.21 mins  Patient history: 58 year old female who presents after an unwitnessed fall with retrograde amnesia, personal report of bilateral lower extremity weakness, and wife's report of incoherent speech, the latter of which lasted several hours before resolution. EEG to evaluate for seizure Level of alertness: Awake AEDs during EEG study: LTG, Clonazepam , GBP, TPM Technical aspects: This EEG study was done with scalp electrodes positioned according to the 10-20 International system of electrode placement. Electrical activity was reviewed with band pass filter of 1-70Hz , sensitivity of 7 uV/mm, display speed of 51mm/sec with a 60Hz  notched filter applied as appropriate. EEG data were recorded continuously and digitally stored.  Video monitoring was available and reviewed as appropriate. Description: The posterior dominant rhythm consists of 9-10 Hz activity of moderate voltage (25-35 uV) seen predominantly in posterior head regions, symmetric and reactive to eye opening and eye closing. Hyperventilation and photic stimulation were not performed.   IMPRESSION: This study is within normal limits. No seizures or epileptiform discharges were seen throughout the recording. A normal interictal EEG does not exclude the diagnosis of epilepsy. Priyanka Cohen Cynthia   CT ANGIO HEAD NECK W WO CM Result Date: 08/31/2023 CLINICAL DATA:  Presented to ED after fall, significant lower extremity weakness, lethargy and disorientation. Right lower extremity weakness and altered mental status. EXAM:  CT ANGIOGRAPHY HEAD AND NECK CT PERFUSION BRAIN TECHNIQUE: Multidetector CT imaging of the head and neck was performed using the standard protocol during bolus administration of intravenous contrast. Multiplanar CT image reconstructions and MIPs were obtained to evaluate the vascular anatomy. Carotid stenosis measurements (when applicable) are obtained utilizing NASCET criteria, using the distal internal carotid diameter as the denominator. Multiphase  CT imaging of the brain was performed following IV bolus contrast injection. Subsequent parametric perfusion maps were calculated using RAPID software. RADIATION DOSE REDUCTION: This exam was performed according to the departmental dose-optimization program which includes automated exposure control, adjustment of the mA and/or kV according to patient size and/or use of iterative reconstruction technique. CONTRAST:  OMNIPAQUE  IOHEXOL  350 MG/ML SOLN COMPARISON:  CT head 08/30/2023. FINDINGS: CTA NECK FINDINGS Aortic arch: Four vessel configuration of the aortic arch with left vertebral artery originating on the arch. Imaged portion shows no evidence of aneurysm or dissection. No significant stenosis of the major arch vessel origins. Pulmonary arteries: As permitted by contrast timing, there are no filling defects in the visualized pulmonary arteries. Subclavian arteries: The subclavian arteries are patent bilaterally. Right carotid system: No evidence of dissection, stenosis (50% or greater), or occlusion. Left carotid system: No evidence of dissection, stenosis (50% or greater), or occlusion. Vertebral arteries: Codominant. No evidence of dissection, stenosis (50% or greater), or occlusion. Skeleton: No acute or aggressive finding noted. Other neck: The visualized airway is patent. No cervical lymphadenopathy. Upper chest: Visualized lung apices are clear. Review of the MIP images confirms the above findings CTA HEAD FINDINGS ANTERIOR CIRCULATION: The intracranial ICAs are patent bilaterally. Minimal focal atherosclerosis of the left cavernous ICA. No significant stenosis, proximal occlusion, aneurysm, or vascular malformation. MCAs: The middle cerebral arteries are patent bilaterally. ACAs: The anterior cerebral arteries are patent bilaterally. POSTERIOR CIRCULATION: No significant stenosis, proximal occlusion, aneurysm, or vascular malformation. PCAs: The posterior cerebral arteries are patent bilaterally.  Pcomm: Visualized on the right. SCAs: The superior cerebellar arteries are patent bilaterally. Basilar artery: Patent AICAs: Patent PICAs: Patent Vertebral arteries: The intracranial vertebral arteries are patent. Venous sinuses: As permitted by contrast timing, patent. Anatomic variants: None Review of the MIP images confirms the above findings CT Brain Perfusion Findings: CBF (<30%) Volume: 0mL Perfusion (Tmax>6.0s) volume: 0mL Mismatch Volume: 0mL Infarction Location:None identified IMPRESSION: No large vessel occlusion. No core infarct identified on CT perfusion. Electronically Signed   By: Donnice Mania M.D.   On: 08/31/2023 14:48   CT CEREBRAL PERFUSION W CONTRAST Result Date: 08/31/2023 CLINICAL DATA:  Presented to ED after fall, significant lower extremity weakness, lethargy and disorientation. Right lower extremity weakness and altered mental status. EXAM: CT ANGIOGRAPHY HEAD AND NECK CT PERFUSION BRAIN TECHNIQUE: Multidetector CT imaging of the head and neck was performed using the standard protocol during bolus administration of intravenous contrast. Multiplanar CT image reconstructions and MIPs were obtained to evaluate the vascular anatomy. Carotid stenosis measurements (when applicable) are obtained utilizing NASCET criteria, using the distal internal carotid diameter as the denominator. Multiphase CT imaging of the brain was performed following IV bolus contrast injection. Subsequent parametric perfusion maps were calculated using RAPID software. RADIATION DOSE REDUCTION: This exam was performed according to the departmental dose-optimization program which includes automated exposure control, adjustment of the mA and/or kV according to patient size and/or use of iterative reconstruction technique. CONTRAST:  OMNIPAQUE  IOHEXOL  350 MG/ML SOLN COMPARISON:  CT head 08/30/2023. FINDINGS: CTA NECK FINDINGS Aortic arch: Four vessel configuration of the aortic arch  with left vertebral artery  originating on the arch. Imaged portion shows no evidence of aneurysm or dissection. No significant stenosis of the major arch vessel origins. Pulmonary arteries: As permitted by contrast timing, there are no filling defects in the visualized pulmonary arteries. Subclavian arteries: The subclavian arteries are patent bilaterally. Right carotid system: No evidence of dissection, stenosis (50% or greater), or occlusion. Left carotid system: No evidence of dissection, stenosis (50% or greater), or occlusion. Vertebral arteries: Codominant. No evidence of dissection, stenosis (50% or greater), or occlusion. Skeleton: No acute or aggressive finding noted. Other neck: The visualized airway is patent. No cervical lymphadenopathy. Upper chest: Visualized lung apices are clear. Review of the MIP images confirms the above findings CTA HEAD FINDINGS ANTERIOR CIRCULATION: The intracranial ICAs are patent bilaterally. Minimal focal atherosclerosis of the left cavernous ICA. No significant stenosis, proximal occlusion, aneurysm, or vascular malformation. MCAs: The middle cerebral arteries are patent bilaterally. ACAs: The anterior cerebral arteries are patent bilaterally. POSTERIOR CIRCULATION: No significant stenosis, proximal occlusion, aneurysm, or vascular malformation. PCAs: The posterior cerebral arteries are patent bilaterally. Pcomm: Visualized on the right. SCAs: The superior cerebellar arteries are patent bilaterally. Basilar artery: Patent AICAs: Patent PICAs: Patent Vertebral arteries: The intracranial vertebral arteries are patent. Venous sinuses: As permitted by contrast timing, patent. Anatomic variants: None Review of the MIP images confirms the above findings CT Brain Perfusion Findings: CBF (<30%) Volume: 0mL Perfusion (Tmax>6.0s) volume: 0mL Mismatch Volume: 0mL Infarction Location:None identified IMPRESSION: No large vessel occlusion. No core infarct identified on CT perfusion. Electronically Signed   By:  Donnice Mania M.D.   On: 08/31/2023 14:48    Vitals:   08/31/23 2044 09/01/23 0053 09/01/23 0435 09/01/23 0756  BP: 109/70 105/60 (!) 84/53 101/62  Pulse: 69 (!) 53 (!) 44 77  Resp: 18 18 18 18   Temp:  98.7 F (37.1 C) 98.8 F (37.1 C) 98.1 F (36.7 C)  TempSrc:    Oral  SpO2: 100% 99% 99% (!) 87%  Weight:      Height:         PHYSICAL EXAM General:  Alert, well-nourished, well-developed patient in no acute distress Psych:  Mood and affect appropriate for situation CV: Regular rate and rhythm on monitor Respiratory:  Regular, unlabored respirations on room air GI: Abdomen soft and nontender   NEURO:  Mental Status: AA&Ox3, patient is able to give clear and coherent history Speech/Language: speech is without dysarthria or aphasia.  Naming, repetition, fluency, and comprehension intact.  Cranial Nerves:  II: PERRL. Visual fields full.  III, IV, VI: EOMI. Eyelids elevate symmetrically.  V: Sensation is intact to light touch and symmetrical to face.  VII: Face is symmetrical resting and smiling VIII: hearing intact to voice. IX, X: Palate elevates symmetrically. Phonation is normal.  KP:Dynloizm shrug 5/5. XII: tongue is midline without fasciculations. Motor: 5/5 strength in bilateral upper extremities Endorses weakness in right lower extremity but no drift on exam  RLE 4/5 LLE 5/5 Tone: is normal and bulk is normal Sensation- Intact to light touch bilaterally. Extinction absent to light touch to DSS.   Coordination: FTN intact bilaterally, HKS: no ataxia in comparison to weakness in BLE.No drift.  Intermittent eye closing and neck extension tics  Gait- deferred   ASSESSMENT/PLAN  Cynthia Cohen is a 58 y.o. female with history of anxiety and depression, PTSD, ADHD, GERD, tic disorder, foot drop, hypothyroidism, HTN, osteoarthritis, chronic back pain, bradycardia, T2DM and asthma who presented to the ED for  evaluation after fall.    Syncope, less likely  seizure Code Stroke CT head No acute abnormality.  CTA head & neck No LVO CT perfusion with no core or penumbra identified  Not able to perform MRI due to bladder stimulator Repeat CT-normal 2D Echo EF 60-65% EEG normal Orthostatic vitals - negative  LDL 71 HgbA1c 5.1 UDS benzo positive (home meds) No AEDs needed at this time VTE prophylaxis - Lovenox  Therapy recommendations:  Home Health PT and Home Health OT Disposition:  Pending, no driving for 6 months until episodes free due to unexplained LOC, patient is aware  Anxiety and depression Tourette syndrome PTSD Continue risperidone , prazosin , lamotrigine , clonazepam  and Vilazodone  Lamotrigine  level pending- follow outpatient Reports compliance with medications EEG- negative for seizure or epileptiform discharges   Vitamin D  deficiency Vitamin D  level was 21.5 3 weeks ago Continue vitamin D  supplementation  Chronic back pain Continue gabapentin  and as needed Robaxin  Endorses pain in neck, states it is likely due to her fall   GERD Continue Nexium  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 Please call with questions. Pt will follow up at [facility] in about 4 weeks. Thanks for the consult.   Ary Cummins, MD PhD Stroke Neurology 09/01/2023 7:06 PM  To contact Stroke Continuity provider, please refer to [email or web address]. After hours, contact General Neurology

## 2023-09-01 NOTE — Progress Notes (Addendum)
 AVS completed for discharge.Patient has requested to speak to provider; does not feel safe going home. Main RN aware; Charge nurse informed.

## 2023-09-01 NOTE — Progress Notes (Signed)
 Physical Therapy Treatment Patient Details Name: Cynthia Cohen MRN: [id number] DOB: 1965/09/09 Today's Date: 09/01/2023   History of Present Illness Pt is a 58 y/o female presenting on 7/23 after fall and confusion. CT head negative for acute progress but significant atrophy diffusely out of proportion to age. CT spine negative for acute changes. Repeat CT pending. EEG negative. PMH includes: anxiety and depression, PTSD, ADHD, GERD, tic disorder, foot drop, HTN, osteoarthritis, chronic back pain, bradycardia, T2DM and asthma, bladder stimulator.    PT Comments  Pt received standing at EOB with nursing and agreeable to session. Pt initially dizzy in standing, however reports improvement after seated rest. Pt able to tolerate increased gait distance this session with CGA for safety. Pt able to perform x5 STS without UE support, but demonstrates increased difficulty due to LE weakness. Pt continues to be limited by weakness and fatigue. Discussed home set up and pt reports concern about the long walkway to enter her home. Will plan to progress gait and trial a curb step next session as able. Pt continues to benefit from PT services to progress toward functional mobility goals.    If plan is discharge home, recommend the following: A little help with walking and/or transfers;A little help with bathing/dressing/bathroom;Assistance with cooking/housework;Assist for transportation;Help with stairs or ramp for entrance   Can travel by private vehicle        Equipment Recommendations  None recommended by PT    Recommendations for Other Services       Precautions / Restrictions Precautions Precautions: Fall Recall of Precautions/Restrictions: Intact Restrictions Weight Bearing Restrictions Per Provider Order: No     Mobility  Bed Mobility Overal bed mobility: Needs Assistance Bed Mobility: Supine to Sit, Sit to Supine     Supine to sit: Supervision Sit to supine: Supervision         Transfers Overall transfer level: Needs assistance Equipment used: Rolling walker (2 wheels) Transfers: Sit to/from Stand Sit to Stand: Contact guard assist           General transfer comment: STS from EOB with CGA for safety. Slow rise and increased effort without UE support    Ambulation/Gait Ambulation/Gait assistance: Contact guard assist Gait Distance (Feet): 100 Feet Assistive device: Rolling walker (2 wheels) Gait Pattern/deviations: Step-through pattern, Trunk flexed, Decreased stride length Gait velocity: decr     General Gait Details: slow, guarded gait with cues for upright posture   Stairs             Wheelchair Mobility     Tilt Bed    Modified Rankin (Stroke Patients Only)       Balance Overall balance assessment: Needs assistance Sitting-balance support: No upper extremity supported, Feet supported Sitting balance-Leahy Scale: Good     Standing balance support: During functional activity, Bilateral upper extremity supported Standing balance-Leahy Scale: Fair Standing balance comment: with RW support                            Communication Communication Communication: No apparent difficulties  Cognition Arousal: Alert Behavior During Therapy: WFL for tasks assessed/performed   PT - Cognitive impairments: No apparent impairments                         Following commands: Intact      Cueing Cueing Techniques: Verbal cues  Exercises Other Exercises Other Exercises: x5 serial STS    General Comments  Pertinent Vitals/Pain Pain Assessment Pain Assessment: Faces Faces Pain Scale: Hurts a little bit Pain Location: neck Pain Descriptors / Indicators: Sore Pain Intervention(s): Monitored during session     PT Goals (current goals can now be found in the care plan section) Acute Rehab PT Goals Patient Stated Goal: go home PT Goal Formulation: With patient Time For Goal Achievement:  09/14/23 Progress towards PT goals: Progressing toward goals    Frequency    Min 3X/week       AM-PAC PT 6 Clicks Mobility   Outcome Measure  Help needed turning from your back to your side while in a flat bed without using bedrails?: None Help needed moving from lying on your back to sitting on the side of a flat bed without using bedrails?: A Little Help needed moving to and from a bed to a chair (including a wheelchair)?: A Little Help needed standing up from a chair using your arms (e.g., wheelchair or bedside chair)?: A Little Help needed to walk in hospital room?: A Little Help needed climbing 3-5 steps with a railing? : A Lot 6 Click Score: 18    End of Session Equipment Utilized During Treatment: Gait belt Activity Tolerance: Patient limited by fatigue;Patient tolerated treatment well Patient left: in bed;with call bell/phone within reach Nurse Communication: Mobility status PT Visit Diagnosis: Unsteadiness on feet (R26.81);Other abnormalities of gait and mobility (R26.89);History of falling (Z91.81);Other symptoms and signs involving the nervous system (R29.898)     Time: 8575-8553 PT Time Calculation (min) (ACUTE ONLY): 22 min  Charges:    $Gait Training: 8-22 mins PT General Charges $$ ACUTE PT VISIT: 1 Visit                     Darryle George, PTA Acute Rehabilitation Services Secure Chat Preferred  [phone]    Darryle George 09/01/2023, 3:53 PM

## 2023-09-01 NOTE — Progress Notes (Signed)
  Echocardiogram 2D Echocardiogram has been performed.  Tinnie FORBES Gosling RDCS 09/01/2023, 1:38 PM

## 2023-09-01 NOTE — Progress Notes (Signed)
Routine EEG complete. Results pending.

## 2023-09-02 DIAGNOSIS — R748 Abnormal levels of other serum enzymes: Secondary | ICD-10-CM | POA: Diagnosis not present

## 2023-09-02 DIAGNOSIS — R531 Weakness: Secondary | ICD-10-CM | POA: Diagnosis not present

## 2023-09-02 DIAGNOSIS — W19XXXA Unspecified fall, initial encounter: Secondary | ICD-10-CM | POA: Diagnosis not present

## 2023-09-02 LAB — BASIC METABOLIC PANEL WITH GFR
Anion gap: 10 (ref 5–15)
BUN: 9 mg/dL (ref 6–20)
CO2: 21 mmol/L — ABNORMAL LOW (ref 22–32)
Calcium: 8.8 mg/dL — ABNORMAL LOW (ref 8.9–10.3)
Chloride: 113 mmol/L — ABNORMAL HIGH (ref 98–111)
Creatinine, Ser: 0.58 mg/dL (ref 0.44–1.00)
GFR, Estimated: >60 mL/min (ref >60)
Glucose, Bld: 77 mg/dL (ref 70–99)
Potassium: 3.3 mmol/L — ABNORMAL LOW (ref 3.5–5.1)
Sodium: 144 mmol/L (ref 135–145)

## 2023-09-02 LAB — GLUCOSE, CAPILLARY: Glucose-Capillary: 79 mg/dL (ref 70–99)

## 2023-09-02 LAB — VITAMIN B1: Vitamin B1 (Thiamine): 130.5 nmol/L (ref 66.5–200.0)

## 2023-09-02 LAB — CBC
HCT: 33.4 % — ABNORMAL LOW (ref 36.0–46.0)
Hemoglobin: 10.8 g/dL — ABNORMAL LOW (ref 12.0–15.0)
MCH: 28.1 pg (ref 26.0–34.0)
MCHC: 32.3 g/dL (ref 30.0–36.0)
MCV: 87 fL (ref 80.0–100.0)
Platelets: 214 K/uL (ref 150–400)
RBC: 3.84 MIL/uL — ABNORMAL LOW (ref 3.87–5.11)
RDW: 12.9 % (ref 11.5–15.5)
WBC: 4.6 K/uL (ref 4.0–10.5)
nRBC: 0 % (ref 0.0–0.2)

## 2023-09-02 NOTE — Progress Notes (Signed)
 DISCHARGE NOTE HOME Cynthia Cohen to be discharged Home per MD order. Discussed prescriptions and follow up appointments with the patient. Prescriptions given to patient; medication list explained in detail. Patient verbalized understanding.  Skin clean, dry and intact without evidence of skin break down, no evidence of skin tears noted. IV catheter discontinued intact. Site without signs and symptoms of complications. Dressing and pressure applied. Pt denies pain at the site currently. No complaints noted.  Patient free of lines, drains, and wounds.   An After Visit Summary (AVS) was printed and given to the patient. Patient awaiting DME in D/C lounge.  Zeidy Tayag K Eastin Swing, RN

## 2023-09-02 NOTE — Progress Notes (Signed)
 Physical Therapy Treatment Patient Details Name: Cynthia Cohen MRN: [id number] DOB: [birth year] Today's Date: 09/02/2023   History of Present Illness Pt is a 58 y/o female presenting on 7/23 after fall and confusion. CT head negative for acute progress but significant atrophy diffusely out of proportion to age. CT spine negative for acute changes. Repeat CT pending. EEG negative. PMH includes: anxiety and depression, PTSD, ADHD, GERD, tic disorder, foot drop, HTN, osteoarthritis, chronic back pain, bradycardia, T2DM and asthma, bladder stimulator.    PT Comments  The pt is making good functional progress, demonstrating improved gait speed as the session progressed and improved activity tolerance. She is able to ambulate steadily without LOB at a descent speed when using a RW, but displays bouts of LOB and a much slower gait speed when attempting to ambulate without UE support, needing minA to recover and maintain her balance without an AD. She was able to navigate x2 stairs with RW support with CGA for safety and no LOB this date. Educated pt on gradually progressing away from relying on a RW provided someone is available to assist and guard her for safety. She verbalized understanding. Will continue to follow acutely.     If plan is discharge home, recommend the following: A little help with walking and/or transfers;A little help with bathing/dressing/bathroom;Assistance with cooking/housework;Assist for transportation;Help with stairs or ramp for entrance   Can travel by private vehicle        Equipment Recommendations  Rolling walker (2 wheels);BSC/3in1    Recommendations for Other Services       Precautions / Restrictions Precautions Precautions: Fall Recall of Precautions/Restrictions: Intact Restrictions Weight Bearing Restrictions Per Provider Order: No     Mobility  Bed Mobility Overal bed mobility: Needs Assistance Bed Mobility: Sit to Supine       Sit to supine:  Supervision, HOB elevated   General bed mobility comments: Supervision for safety    Transfers Overall transfer level: Needs assistance Equipment used: Rolling walker (2 wheels)   Sit to Stand: Supervision           General transfer comment: Supervision for safety, no LOB    Ambulation/Gait Ambulation/Gait assistance: Contact guard assist, Min assist, Supervision Gait Distance (Feet): 425 Feet Assistive device: Rolling walker (2 wheels), None Gait Pattern/deviations: Step-through pattern, Trunk flexed, Decreased stride length, Drifts right/left, Narrow base of support Gait velocity: reduced Gait velocity interpretation: 1.31 - 2.62 ft/sec, indicative of limited community ambulator   General Gait Details: Pt ambulates steadily with RW with no LOB, supervision for safety. Pt able to steadily increase speed when cued through having pt reach a target within a certain amount of time or step to the beat of finger snaps. When ambulating without UE support, pt would drift and sway laterally, losing balance intermittently, needing CGA-minA to recover and maintain balance and safety throughout. Pt ambulates much slower without UE support. Pt needed repeated verbal and tactile cues to improve her posture when using or not using the RW when ambulating as she tends to look down and maintain a mildly flexed posture   Stairs Stairs: Yes Stairs assistance: Contact guard assist Stair Management: Step to pattern, Forwards, With walker Number of Stairs: 2 General stair comments: Used RW over step for support throughout. Ascends and descends with step-to pattern and no LOB, improved speed with second rep compared to first. Educated pt on leading up with her good foot and down with her bad foot as needed. CGA for safety   Wheelchair Mobility  Tilt Bed    Modified Rankin (Stroke Patients Only)       Balance Overall balance assessment: Needs assistance Sitting-balance support: No  upper extremity supported, Feet supported Sitting balance-Leahy Scale: Good     Standing balance support: Bilateral upper extremity supported, No upper extremity supported, During functional activity Standing balance-Leahy Scale: Fair Standing balance comment: able to reach off COG and take steps without UE support, but intermittently has LOB needing minA to recover. No LOB when using RW to ambulate                            Communication Communication Communication: No apparent difficulties  Cognition Arousal: Alert Behavior During Therapy: WFL for tasks assessed/performed   PT - Cognitive impairments: No apparent impairments                         Following commands: Intact      Cueing Cueing Techniques: Verbal cues  Exercises      General Comments General comments (skin integrity, edema, etc.): educated pt on gradually progressing away from relying on the RW provided someone is with her to support and guard her for safety, she verbalized understanding      Pertinent Vitals/Pain Pain Assessment Pain Assessment: Faces Faces Pain Scale: Hurts little more Pain Location: head, neck, back, generalized Pain Descriptors / Indicators: Sore, Other (Comment) (stiff) Pain Intervention(s): Limited activity within patient's tolerance, Monitored during session, Repositioned    Home Living                          Prior Function            PT Goals (current goals can now be found in the care plan section) Acute Rehab PT Goals Patient Stated Goal: go home PT Goal Formulation: With patient Time For Goal Achievement: 09/14/23 Potential to Achieve Goals: Good Progress towards PT goals: Progressing toward goals    Frequency    Min 2X/week      PT Plan      Co-evaluation              AM-PAC PT 6 Clicks Mobility   Outcome Measure  Help needed turning from your back to your side while in a flat bed without using bedrails?:  None Help needed moving from lying on your back to sitting on the side of a flat bed without using bedrails?: A Little Help needed moving to and from a bed to a chair (including a wheelchair)?: A Little Help needed standing up from a chair using your arms (e.g., wheelchair or bedside chair)?: A Little Help needed to walk in hospital room?: A Little Help needed climbing 3-5 steps with a railing? : A Little 6 Click Score: 19    End of Session   Activity Tolerance: Patient tolerated treatment well Patient left: in bed;with call bell/phone within reach;with nursing/sitter in room (RN reporting no need for bed alarm) Nurse Communication: Mobility status PT Visit Diagnosis: Unsteadiness on feet (R26.81);Other abnormalities of gait and mobility (R26.89);History of falling (Z91.81);Other symptoms and signs involving the nervous system (R29.898)     Time: 9079-9065 PT Time Calculation (min) (ACUTE ONLY): 14 min  Charges:    $Gait Training: 8-22 mins PT General Charges $$ ACUTE PT VISIT: 1 Visit  Theo Ferretti, PT, DPT Acute Rehabilitation Services  Office: [phone]    Theo CHRISTELLA Ferretti 09/02/2023, 10:02 AM

## 2023-09-02 NOTE — Progress Notes (Signed)
 Occupational Therapy Treatment Patient Details Name: Cynthia Cohen MRN: [id number] DOB: Oct 04, 1965 Today's Date: 09/02/2023   History of present illness Pt is a 58 y/o female presenting on 7/23 after fall and confusion. CT head negative for acute progress but significant atrophy diffusely out of proportion to age. CT spine negative for acute changes. Repeat CT pending. EEG negative. PMH includes: anxiety and depression, PTSD, ADHD, GERD, tic disorder, foot drop, HTN, osteoarthritis, chronic back pain, bradycardia, T2DM and asthma, bladder stimulator.   OT comments  Pt progressing with gait during therapy sessions, worked with OT on OOB tolerance and ambulating with CGA + RW. She continued to produce slow gait speed, transitioned care at end of session for pt to continue working with PT on that. Pt demonstrated ability to complete ADLs around the room with CGA  even progressing to supervision by end of session. She did express challenges with getting home today, mentioned this to case management. OT to continue following pt acutely to progress as able. Patient would benefit from post acute Home OT services to help maximize functional independence in natural environment       If plan is discharge home, recommend the following:  A little help with walking and/or transfers;A little help with bathing/dressing/bathroom;Assistance with cooking/housework;Assist for transportation;Help with stairs or ramp for entrance   Equipment Recommendations  BSC/3in1    Recommendations for Other Services      Precautions / Restrictions Precautions Precautions: Fall Recall of Precautions/Restrictions: Intact Restrictions Weight Bearing Restrictions Per Provider Order: No       Mobility Bed Mobility Overal bed mobility: Needs Assistance Bed Mobility: Sit to Supine, Supine to Sit     Supine to sit: Supervision Sit to supine: Supervision, HOB elevated   General bed mobility comments: Supervision for  safety    Transfers Overall transfer level: Needs assistance Equipment used: Rolling walker (2 wheels) Transfers: Sit to/from Stand Sit to Stand: Supervision           General transfer comment: from EOB and toilet     Balance Overall balance assessment: Needs assistance Sitting-balance support: No upper extremity supported, Feet supported Sitting balance-Leahy Scale: Good     Standing balance support: Bilateral upper extremity supported, No upper extremity supported, During functional activity Standing balance-Leahy Scale: Fair                             ADL either performed or assessed with clinical judgement   ADL       Grooming: Contact guard assist;Standing;Wash/dry hands           Upper Body Dressing : Set up;Sitting (don gown like jacket)       Toilet Transfer: Contact guard assist;Ambulation;Rolling walker (2 wheels)   Toileting- Clothing Manipulation and Hygiene: Contact guard assist;Sit to/from stand       Functional mobility during ADLs: Contact guard assist;Rolling walker (2 wheels)      Extremity/Trunk Assessment              Vision       Perception     Praxis     Communication Communication Communication: No apparent difficulties   Cognition Arousal: Alert Behavior During Therapy: WFL for tasks assessed/performed Cognition: No apparent impairments                               Following commands: Intact  Cueing   Cueing Techniques: Verbal cues  Exercises      Shoulder Instructions       General Comments Educated pt on stretching muscles upon DC with continue mobility to help relieve soreness. Practiced step up onto curb    Pertinent Vitals/ Pain       Pain Assessment Pain Assessment: Faces Faces Pain Scale: Hurts little more Pain Location: head, neck, back, generalized Pain Descriptors / Indicators: Sore, Other (Comment) (sitff) Pain Intervention(s): Limited activity within  patient's tolerance, Monitored during session, Repositioned  Home Living                                          Prior Functioning/Environment              Frequency  Min 2X/week        Progress Toward Goals  OT Goals(current goals can now be found in the care plan section)  Progress towards OT goals: Progressing toward goals  Acute Rehab OT Goals Patient Stated Goal: home OT Goal Formulation: With patient Time For Goal Achievement: 09/15/23 Potential to Achieve Goals: Good  Plan      Co-evaluation                 AM-PAC OT 6 Clicks Daily Activity     Outcome Measure   Help from another person eating meals?: None Help from another person taking care of personal grooming?: A Little Help from another person toileting, which includes using toliet, bedpan, or urinal?: A Little Help from another person bathing (including washing, rinsing, drying)?: A Little Help from another person to put on and taking off regular upper body clothing?: A Little Help from another person to put on and taking off regular lower body clothing?: A Little 6 Click Score: 19    End of Session Equipment Utilized During Treatment: Gait belt;Rolling walker (2 wheels)  OT Visit Diagnosis: Other abnormalities of gait and mobility (R26.89);Muscle weakness (generalized) (M62.81)   Activity Tolerance Patient tolerated treatment well   Patient Left Other (comment) (Pt left at bedside sink to furhter work with PT.)   Nurse Communication Mobility status;Precautions        Time: 0902-0920 OT Time Calculation (min): 18 min  Charges: OT General Charges $OT Visit: 1 Visit OT Treatments $Therapeutic Activity: 8-22 mins  09/02/2023  AB, OTR/L  Acute Rehabilitation Services  Office: [phone]   Cynthia Cohen 09/02/2023, 10:50 AM

## 2023-09-02 NOTE — Plan of Care (Signed)

## 2023-09-02 NOTE — TOC CM/SW Note (Addendum)
 Pt has been DC. Pt is waiting for the DME (RW) that was ordered yesterday and she needs assistance with transportation. The RW was ordered through [facility]. Contacted Ada at [facility] to f/u with the DME referral. She reports that pt needs to pay the co-payment before they can deliver the DME. Pt reports that she doesn't have any money with her and she is not able to pay the co-payment. Pt agrees to use another DME agency. Will contact [employer] for the DME referral. Contacted Jermaine at [facility] and he accepted the referral for a RW and a 3-in-1 BSC. The DC lounge will provide pt a taxi voucher.

## 2023-09-02 NOTE — Discharge Summary (Signed)
 Physician Discharge Summary   Patient: Cynthia Cohen MRN: [id number] DOB: 24-Feb-1965  Admit date:     08/30/2023  Discharge date: 09/02/23  Discharge Physician: Lonni SHAUNNA Dalton   PCP: Alfonso Sheffield    Recommendations at discharge:  Follow up with PCP in 1 week Resume prazosin  pending blood pressure Follow up with Psychiatry in 1 week for medication related side effects     Discharge Diagnoses: Principal Problem:   Fall Active Problems:   Generalized weakness   Weakness of both lower extremities   Elevated CK       Hospital Course: 58 y.o. female with medical history significant for  anxiety and depression, PTSD, ADHD, GERD, foot drop, tic disorder, hypothyroidism, HTN, osteoarthritis, chronic back pain, bradycardia, T2DM and asthma who presented to the ED for evaluation after fall.   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  The next thing she remembers, she was on the floor and both of her legs felt too weak for her to stand upright.  Her wife attempted to gain access to the bathroom but the door was locked.  Wife states that patient's speech was incoherent and the speech changes seemed to have lasted at least several hours, resolving this morning, per patient      Confusion due to medications Admitted and underwent CT head which was normal. MRI brain could not be obtained due to implanted device, and so she underwent repeat CT head that was also normal.  EEG unremarkable.  Orthostatic vital signs normal.  CTA head and neck unremarkable.  Blood counts and chemistries showed no derangment of electrolytes or indication of infection.  She was observed 48 hours and mentation returned to normal.  She had some nocturnal hypotension in the setting of prazosin  use.  Neurology were consulted and suspected no stroke or seizure.  She takes clonazepam  1 mg TID, gabapentin , risperidone , vilazodone , and has  prescriptions for Robaxin  as well.   Suspect polypharmacy related fall, confusion, weakness, possible syncope.                The [place]  Controlled Substances Registry was reviewed for this patient prior to discharge.  Consultants: Neurology Disposition: Home Diet recommendation:  Discharge Diet Orders (From admission, onward)     Start     Ordered   09/01/23 0000  Diet - low sodium heart healthy        09/01/23 1700             DISCHARGE MEDICATION: Allergies as of 09/02/2023       Reactions   Aripiprazole Other (See Comments)   Cervical dystonia and worsening of tic disorder   Metoclopramide Anaphylaxis, Other (See Comments)   Jitters, mental status change, tremors, agitation, muscle movements (too)   Nitrofurantoin  Dermatitis, Rash   Celebrex  [celecoxib ] Other (See Comments)   Patient states that it started to make her stomach sore   Tape Dermatitis, Rash   Localized adhesive reaction. Paper tape is okay.   Wound Dressing Adhesive Dermatitis, Other (See Comments)   Localized adhesive reaction        Medication List     PAUSE taking these medications    prazosin  2 MG capsule Wait to take this until your doctor or other care provider tells you to start again. Commonly known as: MINIPRESS  Take 4 mg by mouth at bedtime.       TAKE these medications  albuterol  108 (90 Base) MCG/ACT inhaler Commonly known as: VENTOLIN  HFA Inhale 1-2 puffs into the lungs every 6 (six) hours as needed for wheezing or shortness of breath.   clonazePAM  1 MG tablet Commonly known as: KLONOPIN  Take 1 mg by mouth 3 (three) times daily.   esomeprazole  10 MG packet Commonly known as: NEXIUM  Take 10 mg by mouth daily before breakfast.   gabapentin  400 MG capsule Commonly known as: NEURONTIN  Take 400 mg by mouth 2 (two) times a day.   lamoTRIgine  200 MG tablet Commonly known as: LAMICTAL  Take 300 mg by mouth daily.   methocarbamol  750 MG  tablet Commonly known as: ROBAXIN  Take 750 mg by mouth 3 (three) times daily as needed.   methylphenidate  54 MG CR tablet Commonly known as: CONCERTA  Take 54 mg by mouth every morning.   methylphenidate  10 MG tablet Commonly known as: RITALIN  Take 10 mg by mouth daily.   risperiDONE  0.25 MG tablet Commonly known as: RISPERDAL  Take 0.25 mg by mouth 2 (two) times daily.   topiramate  100 MG tablet Commonly known as: TOPAMAX  Take 100 mg by mouth 2 (two) times daily.   traMADol 50 MG tablet Commonly known as: ULTRAM Take 50 mg by mouth as needed for moderate pain (pain score 4-6).   traZODone  100 MG tablet Commonly known as: DESYREL  Take 50-100 mg by mouth at bedtime as needed for sleep.   Vilazodone  HCl 40 MG Tabs Commonly known as: VIIBRYD  Take 40 mg by mouth daily.               Durable Medical Equipment  (From admission, onward)           Start     Ordered   09/01/23 1215  For home use only DME Walker rolling  Once       Question Answer Comment  Walker: With 5 Inch Wheels   Patient needs a walker to treat with the following condition Weakness      09/01/23 1214            Follow-up Information     [facility]. Schedule an appointment as soon as possible for a visit in 1 month(s).   Specialty: Neurology Contact information: [address] [place]   [address] [phone]                Discharge Instructions     Ambulatory referral to Neurology   Complete by: As directed    Follow up with any provider in about 4-6 weeks. Thanks.   Call MD for:  difficulty breathing, headache or visual disturbances   Complete by: As directed    Call MD for:  persistant dizziness or light-headedness   Complete by: As directed    Call MD for:  severe uncontrolled pain   Complete by: As directed    Call MD for:  temperature >100.4   Complete by: As directed    Diet - low sodium heart healthy    Complete by: As directed    Discharge instructions   Complete by: As directed    You were admitted for passing out and being confused  This was likely due to clonazepam , which has expected side effects of confusion, memory loss, falls, fractures, and can contribute to passing out.  You had an extensive work up here with CT head, repeat CT head, CT angiogram of the blood vessels of the head and neck, electroencephalogram and Neurology evaluation.  This work up  was all normal and the most likely explanation for your condition is side effects from medicines  Because we observed your low blood pressure here overnight, I recommend you hold your prazosin  until you see your primary care doctor  You should also call your psychiatrist for a close follow up appointment.   Increase activity slowly   Complete by: As directed        Discharge Exam: Filed Weights   08/30/23 1347  Weight: 56.7 kg    General: Pt is alert, awake, not in acute distress Cardiovascular: RRR, nl S1-S2, no murmurs appreciated.   No LE edema.   Respiratory: Normal respiratory rate and rhythm.  CTAB without rales or wheezes. Abdominal: Abdomen soft and non-tender.  No distension or HSM.   Neuro/Psych: Strength symmetric in upper and lower extremities.  Judgment and insight appear normal.   Condition at discharge: fair  The results of significant diagnostics from this hospitalization (including imaging, microbiology, ancillary and laboratory) are listed below for reference.   Imaging Studies: CT HEAD WO CONTRAST ( ) Result Date: 09/01/2023 CLINICAL DATA:  Stroke, follow up EXAM: CT HEAD WITHOUT CONTRAST TECHNIQUE: Contiguous axial images were obtained from the base of the skull through the vertex without intravenous contrast. RADIATION DOSE REDUCTION: This exam was performed according to the departmental dose-optimization program which includes automated exposure control, adjustment of the mA and/or kV according to  patient size and/or use of iterative reconstruction technique. COMPARISON:  CT the head dated August 30, 2023. FINDINGS: Brain: Mild cerebral volume loss. The brain appears normal. There is no evidence of hemorrhage, mass, cortical infarct or hydrocephalus. Vascular: Mild vascular calcifications. Skull: Intact and unremarkable. Sinuses/Orbits: Clear paranasal sinuses.  Normal orbits. Other: None. IMPRESSION: Normal. Electronically Signed   By: Evalene Coho M.D.   On: 09/01/2023 16:04   ECHOCARDIOGRAM COMPLETE BUBBLE STUDY Result Date: 09/01/2023    ECHOCARDIOGRAM REPORT   Patient Name:   Philomina Leon Date of Exam: 09/01/2023 Medical Rec #:  [id number]       Height:       63.0 in Accession #:    [id number]      Weight:       125.0 lb Date of Birth:  12-13-1965      BSA:          1.584 m Patient Age:    57 years        BP:           101/62 mmHg Patient Gender: F               HR:           68 bpm. Exam Location:  Inpatient Procedure: 2D Echo, Color Doppler, Cardiac Doppler and Saline Contrast Bubble            Study (Both Spectral and Color Flow Doppler were utilized during            procedure). Indications:    Stroke 434.91 / I63.9  History:        Patient has no prior history of Echocardiogram examinations.                 Risk Factors:Diabetes and Hypertension.  Sonographer:    Tinnie Gosling RDCS Referring Phys: RAYFIELD PETER IMPRESSIONS  1. Left ventricular ejection fraction, by estimation, is 60 to 65%. The left ventricle has normal function. The left ventricle has no regional wall motion abnormalities. Left ventricular diastolic parameters were normal.  2. Right ventricular systolic  function is normal. The right ventricular size is normal.  3. The mitral valve is normal in structure. No evidence of mitral valve regurgitation. No evidence of mitral stenosis.  4. The aortic valve is normal in structure. Aortic valve regurgitation is not visualized. No aortic stenosis is present.  5. The inferior vena cava  is normal in size with greater than 50% respiratory variability, suggesting right atrial pressure of 3 mmHg.  6. Agitated saline contrast bubble study was negative, with no evidence of any interatrial shunt. FINDINGS  Left Ventricle: Left ventricular ejection fraction, by estimation, is 60 to 65%. The left ventricle has normal function. The left ventricle has no regional wall motion abnormalities. The left ventricular internal cavity size was normal in size. There is  no left ventricular hypertrophy. Left ventricular diastolic parameters were normal. Right Ventricle: The right ventricular size is normal. No increase in right ventricular wall thickness. Right ventricular systolic function is normal. Left Atrium: Left atrial size was normal in size. Right Atrium: Right atrial size was normal in size. Pericardium: There is no evidence of pericardial effusion. Mitral Valve: The mitral valve is normal in structure. No evidence of mitral valve regurgitation. No evidence of mitral valve stenosis. Tricuspid Valve: The tricuspid valve is normal in structure. Tricuspid valve regurgitation is not demonstrated. No evidence of tricuspid stenosis. Aortic Valve: The aortic valve is normal in structure. Aortic valve regurgitation is not visualized. No aortic stenosis is present. Pulmonic Valve: The pulmonic valve was normal in structure. Pulmonic valve regurgitation is not visualized. No evidence of pulmonic stenosis. Aorta: The aortic root is normal in size and structure. Venous: The inferior vena cava is normal in size with greater than 50% respiratory variability, suggesting right atrial pressure of 3 mmHg. IAS/Shunts: No atrial level shunt detected by color flow Doppler. Agitated saline contrast was given intravenously to evaluate for intracardiac shunting. Agitated saline contrast bubble study was negative, with no evidence of any interatrial shunt.  LEFT VENTRICLE PLAX 2D LVIDd:         4.50 cm   Diastology LVIDs:          3.10 cm   LV e' medial:    7.18 cm/s LV PW:         1.00 cm   LV E/e' medial:  10.4 LV IVS:        1.00 cm   LV e' lateral:   8.27 cm/s LVOT diam:     1.80 cm   LV E/e' lateral: 9.1 LV SV:         56 LV SV Index:   36 LVOT Area:     2.54 cm  RIGHT VENTRICLE RV S prime:     24.10 cm/s TAPSE (M-mode): 1.8 cm LEFT ATRIUM             Index        RIGHT ATRIUM           Index LA diam:        3.10 cm 1.96 cm/m   RA Area:     15.70 cm LA Vol (A2C):   56.3 ml 35.55 ml/m  RA Volume:   40.60 ml  25.64 ml/m LA Vol (A4C):   39.9 ml 25.20 ml/m LA Biplane Vol: 51.1 ml 32.27 ml/m  AORTIC VALVE LVOT Vmax:   112.00 cm/s LVOT Vmean:  78.100 cm/s LVOT VTI:    0.221 m  AORTA Ao Root diam: 2.80 cm Ao Asc diam:  3.30 cm MITRAL VALVE  MV Area (PHT): 3.70 cm    SHUNTS MV Decel Time: 205 msec    Systemic VTI:  0.22 m MV E velocity: 74.90 cm/s  Systemic Diam: 1.80 cm MV A velocity: 79.70 cm/s MV E/A ratio:  0.94 Morene Brownie Electronically signed by Morene Brownie Signature Date/Time: 09/01/2023/2:37:11 PM    Final    EEG adult Result Date: 09/01/2023 Shelton Arlin KIDD, MD     09/01/2023 11:03 AM Patient Name: Menucha Dicesare MRN: [id number] Epilepsy Attending: Arlin KIDD Shelton Referring Physician/Provider: Merrianne Locus, MD Date: 09/01/2023 Duration: 22.21 mins Patient history: 58 year old female who presents after an unwitnessed fall with retrograde amnesia, personal report of bilateral lower extremity weakness, and wife's report of incoherent speech, the latter of which lasted several hours before resolution. EEG to evaluate for seizure Level of alertness: Awake AEDs during EEG study: LTG, Clonazepam , GBP, TPM Technical aspects: This EEG study was done with scalp electrodes positioned according to the 10-20 International system of electrode placement. Electrical activity was reviewed with band pass filter of 1-70Hz , sensitivity of 7 uV/mm, display speed of 38mm/sec with a 60Hz  notched filter applied as appropriate. EEG data were  recorded continuously and digitally stored.  Video monitoring was available and reviewed as appropriate. Description: The posterior dominant rhythm consists of 9-10 Hz activity of moderate voltage (25-35 uV) seen predominantly in posterior head regions, symmetric and reactive to eye opening and eye closing. Hyperventilation and photic stimulation were not performed.   IMPRESSION: This study is within normal limits. No seizures or epileptiform discharges were seen throughout the recording. A normal interictal EEG does not exclude the diagnosis of epilepsy. Priyanka KIDD Shelton   CT ANGIO HEAD NECK W WO CM Result Date: 08/31/2023 CLINICAL DATA:  Presented to ED after fall, significant lower extremity weakness, lethargy and disorientation. Right lower extremity weakness and altered mental status. EXAM: CT ANGIOGRAPHY HEAD AND NECK CT PERFUSION BRAIN TECHNIQUE: Multidetector CT imaging of the head and neck was performed using the standard protocol during bolus administration of intravenous contrast. Multiplanar CT image reconstructions and MIPs were obtained to evaluate the vascular anatomy. Carotid stenosis measurements (when applicable) are obtained utilizing NASCET criteria, using the distal internal carotid diameter as the denominator. Multiphase CT imaging of the brain was performed following IV bolus contrast injection. Subsequent parametric perfusion maps were calculated using RAPID software. RADIATION DOSE REDUCTION: This exam was performed according to the departmental dose-optimization program which includes automated exposure control, adjustment of the mA and/or kV according to patient size and/or use of iterative reconstruction technique. CONTRAST:  OMNIPAQUE  IOHEXOL  350 MG/ML SOLN COMPARISON:  CT head 08/30/2023. FINDINGS: CTA NECK FINDINGS Aortic arch: Four vessel configuration of the aortic arch with left vertebral artery originating on the arch. Imaged portion shows no evidence of aneurysm or  dissection. No significant stenosis of the major arch vessel origins. Pulmonary arteries: As permitted by contrast timing, there are no filling defects in the visualized pulmonary arteries. Subclavian arteries: The subclavian arteries are patent bilaterally. Right carotid system: No evidence of dissection, stenosis (50% or greater), or occlusion. Left carotid system: No evidence of dissection, stenosis (50% or greater), or occlusion. Vertebral arteries: Codominant. No evidence of dissection, stenosis (50% or greater), or occlusion. Skeleton: No acute or aggressive finding noted. Other neck: The visualized airway is patent. No cervical lymphadenopathy. Upper chest: Visualized lung apices are clear. Review of the MIP images confirms the above findings CTA HEAD FINDINGS ANTERIOR CIRCULATION: The intracranial ICAs are patent bilaterally. Minimal  focal atherosclerosis of the left cavernous ICA. No significant stenosis, proximal occlusion, aneurysm, or vascular malformation. MCAs: The middle cerebral arteries are patent bilaterally. ACAs: The anterior cerebral arteries are patent bilaterally. POSTERIOR CIRCULATION: No significant stenosis, proximal occlusion, aneurysm, or vascular malformation. PCAs: The posterior cerebral arteries are patent bilaterally. Pcomm: Visualized on the right. SCAs: The superior cerebellar arteries are patent bilaterally. Basilar artery: Patent AICAs: Patent PICAs: Patent Vertebral arteries: The intracranial vertebral arteries are patent. Venous sinuses: As permitted by contrast timing, patent. Anatomic variants: None Review of the MIP images confirms the above findings CT Brain Perfusion Findings: CBF (<30%) Volume: 0mL Perfusion (Tmax>6.0s) volume: 0mL Mismatch Volume: 0mL Infarction Location:None identified IMPRESSION: No large vessel occlusion. No core infarct identified on CT perfusion. Electronically Signed   By: Donnice Mania M.D.   On: 08/31/2023 14:48   CT CEREBRAL PERFUSION W  CONTRAST Result Date: 08/31/2023 CLINICAL DATA:  Presented to ED after fall, significant lower extremity weakness, lethargy and disorientation. Right lower extremity weakness and altered mental status. EXAM: CT ANGIOGRAPHY HEAD AND NECK CT PERFUSION BRAIN TECHNIQUE: Multidetector CT imaging of the head and neck was performed using the standard protocol during bolus administration of intravenous contrast. Multiplanar CT image reconstructions and MIPs were obtained to evaluate the vascular anatomy. Carotid stenosis measurements (when applicable) are obtained utilizing NASCET criteria, using the distal internal carotid diameter as the denominator. Multiphase CT imaging of the brain was performed following IV bolus contrast injection. Subsequent parametric perfusion maps were calculated using RAPID software. RADIATION DOSE REDUCTION: This exam was performed according to the departmental dose-optimization program which includes automated exposure control, adjustment of the mA and/or kV according to patient size and/or use of iterative reconstruction technique. CONTRAST:  OMNIPAQUE  IOHEXOL  350 MG/ML SOLN COMPARISON:  CT head 08/30/2023. FINDINGS: CTA NECK FINDINGS Aortic arch: Four vessel configuration of the aortic arch with left vertebral artery originating on the arch. Imaged portion shows no evidence of aneurysm or dissection. No significant stenosis of the major arch vessel origins. Pulmonary arteries: As permitted by contrast timing, there are no filling defects in the visualized pulmonary arteries. Subclavian arteries: The subclavian arteries are patent bilaterally. Right carotid system: No evidence of dissection, stenosis (50% or greater), or occlusion. Left carotid system: No evidence of dissection, stenosis (50% or greater), or occlusion. Vertebral arteries: Codominant. No evidence of dissection, stenosis (50% or greater), or occlusion. Skeleton: No acute or aggressive finding noted. Other neck: The  visualized airway is patent. No cervical lymphadenopathy. Upper chest: Visualized lung apices are clear. Review of the MIP images confirms the above findings CTA HEAD FINDINGS ANTERIOR CIRCULATION: The intracranial ICAs are patent bilaterally. Minimal focal atherosclerosis of the left cavernous ICA. No significant stenosis, proximal occlusion, aneurysm, or vascular malformation. MCAs: The middle cerebral arteries are patent bilaterally. ACAs: The anterior cerebral arteries are patent bilaterally. POSTERIOR CIRCULATION: No significant stenosis, proximal occlusion, aneurysm, or vascular malformation. PCAs: The posterior cerebral arteries are patent bilaterally. Pcomm: Visualized on the right. SCAs: The superior cerebellar arteries are patent bilaterally. Basilar artery: Patent AICAs: Patent PICAs: Patent Vertebral arteries: The intracranial vertebral arteries are patent. Venous sinuses: As permitted by contrast timing, patent. Anatomic variants: None Review of the MIP images confirms the above findings CT Brain Perfusion Findings: CBF (<30%) Volume: 0mL Perfusion (Tmax>6.0s) volume: 0mL Mismatch Volume: 0mL Infarction Location:None identified IMPRESSION: No large vessel occlusion. No core infarct identified on CT perfusion. Electronically Signed   By: Donnice Mania M.D.   On: 08/31/2023 14:48  CT Lumbar Spine Wo Contrast Result Date: 08/30/2023 EXAM: CT OF THE LUMBAR SPINE WITHOUT CONTRAST 08/30/2023 06:38:13 PM TECHNIQUE: CT of the lumbar spine was performed without the administration of intravenous contrast. Multiplanar reformatted images are provided for review. Automated exposure control, iterative reconstruction, and/or weight based adjustment of the mA/kV was utilized to reduce the radiation dose to as low as reasonably achievable. COMPARISON: 08/31/2021 CLINICAL HISTORY: Low back pain, trauma. c/o multiple falls and weakness, patient fell twice today. 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 She denies hitting her head, no thinners and no LOC. She c/o neck pain but states its due to her not taking Celebrex  and c/o right knee pain from fall. FINDINGS: BONES AND ALIGNMENT: Normal vertebral body heights. No acute fracture or suspicious bone lesion. Normal alignment. DEGENERATIVE CHANGES: Mild degenerative changes, most prominent at L1-2, unchanged. SOFT TISSUES: No acute abnormality. Status post cholecystectomy. Nonobstructing left upper pole renal calculi measuring up to 10 mm (image 22), without hydronephrosis. Sacral stimulator. IMPRESSION: 1. No acute findings. Electronically signed by: Pinkie Pebbles MD 08/30/2023 07:04 PM EDT RP Workstation: [id number]   CT Head Wo Contrast Result Date: 08/30/2023 EXAM: CT HEAD WITHOUT CONTRAST 08/30/2023 06:38:13 PM TECHNIQUE: CT of the head was performed without the administration of intravenous contrast. Automated exposure control, iterative reconstruction, and/or weight based adjustment of the mA/kV was utilized to reduce the radiation dose to as low as reasonably achievable. COMPARISON: 07/19/2018 CLINICAL HISTORY: Head trauma, abnormal mental status (Age [age]y). c/o multiple falls and weakness, patient fell twice today. 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 She denies hitting her head, no thinners and no LOC. She c/o neck pain but states its due to her not taking Celebrex  and c/o right knee pain from fall. FINDINGS: BRAIN AND VENTRICLES: No acute hemorrhage. Gray-white differentiation is preserved. No hydrocephalus. No extra-axial collection. No mass effect or midline shift. ORBITS: No acute abnormality. SINUSES: No acute abnormality. SOFT TISSUES AND SKULL: No acute soft tissue abnormality. No skull fracture. IMPRESSION: 1. No acute intracranial abnormality. Electronically signed by: Pinkie Pebbles MD 08/30/2023 07:01 PM EDT RP Workstation: [id number]   CT Cervical Spine Wo  Contrast Result Date: 08/30/2023 CLINICAL DATA:  Neck trauma, uncomplicated (NEXUS/CCR neg) (Age 43-64y) Multiple falls with weakness. EXAM: CT CERVICAL SPINE WITHOUT CONTRAST TECHNIQUE: Multidetector CT imaging of the cervical spine was performed without intravenous contrast. Multiplanar CT image reconstructions were also generated. RADIATION DOSE REDUCTION: This exam was performed according to the departmental dose-optimization program which includes automated exposure control, adjustment of the mA and/or kV according to patient size and/or use of iterative reconstruction technique. COMPARISON:  CT cervical spine 07/08/2021 FINDINGS: Alignment: Convex right cervicothoracic scoliosis with near anatomic lateral alignment. Skull base and vertebrae: No evidence of acute cervical spine fracture or traumatic subluxation. Soft tissues and spinal canal: No prevertebral fluid or swelling. No visible canal hematoma. Disc levels: Mild spondylosis with relatively preserved disc height. Scattered progressive facet hypertrophy, greatest on the right at C2-3 and T1-2 and on the left at C4-5. No high-grade foraminal narrowing. Upper chest: Clear lung apices. Other: None. IMPRESSION: 1. No evidence of acute cervical spine fracture, traumatic subluxation or static signs of instability. 2. Progressive facet hypertrophy as described which may contribute to neck pain. Electronically Signed   By: Elsie Perone M.D.   On: 08/30/2023 18:57   DG Chest Portable 1 View Result Date: 08/30/2023 CLINICAL DATA:  Fall.  Weakness. EXAM: PORTABLE CHEST 1 VIEW COMPARISON:  12/14/2021. FINDINGS: Trachea is midline. Heart size is accentuated by AP semi upright technique. Lungs are clear. No pleural fluid. Old right rib fractures. IMPRESSION: No acute findings. Electronically Signed   By: Newell Eke M.D.   On: 08/30/2023 17:52    Microbiology: Results for orders placed or performed during the hospital encounter of 08/30/23  Resp panel  by RT-PCR (RSV, Flu A&B, Covid)     Status: None   Collection Time: 08/30/23  6:45 PM  Result Value Ref Range Status   SARS Coronavirus 2 by RT PCR NEGATIVE NEGATIVE Final   Influenza A by PCR NEGATIVE NEGATIVE Final   Influenza B by PCR NEGATIVE NEGATIVE Final    Comment: (NOTE) The Xpert Xpress SARS-CoV-2/FLU/RSV plus assay is intended as an aid in the diagnosis of influenza from Nasopharyngeal swab specimens and should not be used as a sole basis for treatment. Nasal washings and aspirates are unacceptable for Xpert Xpress SARS-CoV-2/FLU/RSV testing.  Fact Sheet for Patients: BloggerCourse.com  Fact Sheet for Healthcare Providers: SeriousBroker.it  This test is not yet approved or cleared by the [place]  FDA and has been authorized for detection and/or diagnosis of SARS-CoV-2 by FDA under an Emergency Use Authorization (EUA). This EUA will remain in effect (meaning this test can be used) for the duration of the COVID-19 declaration under Section 564(b)(1) of the Act, 21 U.S.C. section 360bbb-3(b)(1), unless the authorization is terminated or revoked.     Resp Syncytial Virus by PCR NEGATIVE NEGATIVE Final    Comment: (NOTE) Fact Sheet for Patients: BloggerCourse.com  Fact Sheet for Healthcare Providers: SeriousBroker.it  This test is not yet approved or cleared by the [place]  FDA and has been authorized for detection and/or diagnosis of SARS-CoV-2 by FDA under an Emergency Use Authorization (EUA). This EUA will remain in effect (meaning this test can be used) for the duration of the COVID-19 declaration under Section 564(b)(1) of the Act, 21 U.S.C. section 360bbb-3(b)(1), unless the authorization is terminated or revoked.  Performed at [facility], [address], [place], [place] [address]     Labs: CBC: Recent Labs  Lab 08/30/23 1517  08/31/23 0900 09/01/23 0342 09/02/23 0332  WBC 14.9* 8.4 5.5 4.6  HGB 11.2* 10.6* 10.3* 10.8*  HCT 36.1 34.1* 32.6* 33.4*  MCV 89.6 89.5 88.3 87.0  PLT 198 175 171 214   Basic Metabolic Panel: Recent Labs  Lab 08/30/23 1517 08/31/23 0900 09/01/23 0342 09/02/23 0332  NA 144 140 141 144  K 3.4* 3.5 3.5 3.3*  CL 115* 116* 113* 113*  CO2 23 18* 19* 21*  GLUCOSE 124* 94 126* 77  BUN 13 9 6 9   CREATININE 0.69 0.50 0.63 0.58  CALCIUM 8.7* 8.2* 8.5* 8.8*   Liver Function Tests: Recent Labs  Lab 08/30/23 1517  AST 25  ALT 10  ALKPHOS 74  BILITOT 0.8  PROT 6.5  ALBUMIN 3.2*   CBG: Recent Labs  Lab 08/31/23 1629 09/01/23 0624 09/01/23 1613 09/01/23 2125 09/02/23 0609  GLUCAP 133* 75 120* 97 79    Discharge time spent: approximately 25 minutes spent on discharge counseling, evaluation of patient on day of discharge, and coordination of discharge planning with nursing, social work, pharmacy and case management  Signed: Lonni SHAUNNA Dalton, MD [facility] 09/02/2023

## 2023-09-02 NOTE — TOC CM/SW Note (Signed)
 Notified Alease Hunter at [facility] that pt has been DC.

## 2023-09-04 LAB — LAMOTRIGINE LEVEL: Lamotrigine Lvl: 9.5 ug/mL (ref 2.0–20.0)

## 2023-09-04 NOTE — Telephone Encounter (Signed)
 VM from pt requesting refill of Tramadol due to recent hospitalization for a fall.  RPC.Verified name and DOB. Pt is asking for a Tramadol refill while she is recuperating from a fall at home in the shower. Pt also mentioned being started on a patch for pain, which she says was mentioned at last video visit on 07/07/23. Advised pt that she will need to be seen in clinic to discuss medication options. Scheduled pt for RPV on 09/08/23. She requests this appt be changed to a video visit. Advised pt I will ask for Christian's permission to get this 09/08/23 appt changed to video.  Per DC summary: She takes clonazepam  1 mg TID, gabapentin , risperidone , vilazodone , and has prescriptions for Robaxin  as well. Suspect polypharmacy related fall, confusion, weakness, possible syncope.   Pt is s/p admission 7/23-7/25 for a fall related to possible polypharmacy per dc summary. Pt is asking for a refill on Tramadol and for her 09/08/23 rpv be changed to video.

## 2023-10-11 ENCOUNTER — Encounter: Payer: Self-pay | Admitting: Diagnostic Neuroimaging

## 2023-10-11 ENCOUNTER — Ambulatory Visit: Admitting: Diagnostic Neuroimaging
